# Patient Record
Sex: Female | Born: 1940 | Race: White | Hispanic: No | Smoking: Never smoker
Health system: Southern US, Community
[De-identification: ages and names within clinical notes are randomized; demographics above are authoritative.]

## PROBLEM LIST (undated history)

## (undated) DIAGNOSIS — E785 Hyperlipidemia, unspecified: Secondary | ICD-10-CM

## (undated) DIAGNOSIS — K648 Other hemorrhoids: Secondary | ICD-10-CM

## (undated) DIAGNOSIS — K449 Diaphragmatic hernia without obstruction or gangrene: Secondary | ICD-10-CM

## (undated) DIAGNOSIS — K222 Esophageal obstruction: Secondary | ICD-10-CM

## (undated) DIAGNOSIS — M8580 Other specified disorders of bone density and structure, unspecified site: Secondary | ICD-10-CM

## (undated) DIAGNOSIS — I2510 Atherosclerotic heart disease of native coronary artery without angina pectoris: Secondary | ICD-10-CM

## (undated) DIAGNOSIS — K644 Residual hemorrhoidal skin tags: Secondary | ICD-10-CM

## (undated) DIAGNOSIS — I2111 ST elevation (STEMI) myocardial infarction involving right coronary artery: Secondary | ICD-10-CM

## (undated) DIAGNOSIS — K219 Gastro-esophageal reflux disease without esophagitis: Secondary | ICD-10-CM

## (undated) DIAGNOSIS — D369 Benign neoplasm, unspecified site: Secondary | ICD-10-CM

## (undated) DIAGNOSIS — I10 Essential (primary) hypertension: Secondary | ICD-10-CM

## (undated) DIAGNOSIS — D249 Benign neoplasm of unspecified breast: Secondary | ICD-10-CM

## (undated) DIAGNOSIS — C4490 Unspecified malignant neoplasm of skin, unspecified: Secondary | ICD-10-CM

## (undated) HISTORY — DX: Other specified disorders of bone density and structure, unspecified site: M85.80

## (undated) HISTORY — PX: TONSILLECTOMY AND ADENOIDECTOMY: SUR1326

## (undated) HISTORY — PX: ESOPHAGOGASTRODUODENOSCOPY (EGD) WITH ESOPHAGEAL DILATION: SHX5812

## (undated) HISTORY — DX: Gastro-esophageal reflux disease without esophagitis: K21.9

## (undated) HISTORY — DX: Diaphragmatic hernia without obstruction or gangrene: K44.9

## (undated) HISTORY — DX: Benign neoplasm, unspecified site: D36.9

## (undated) HISTORY — DX: Other hemorrhoids: K64.8

## (undated) HISTORY — DX: Esophageal obstruction: K22.2

## (undated) HISTORY — DX: Benign neoplasm of unspecified breast: D24.9

## (undated) HISTORY — DX: Hyperlipidemia, unspecified: E78.5

## (undated) HISTORY — PX: BREAST CYST ASPIRATION: SHX578

## (undated) HISTORY — DX: Residual hemorrhoidal skin tags: K64.4

---

## 1969-05-21 HISTORY — PX: THYROID CYST EXCISION: SHX2511

## 1975-09-21 HISTORY — PX: ABDOMINAL HYSTERECTOMY: SHX81

## 1997-07-21 DIAGNOSIS — D369 Benign neoplasm, unspecified site: Secondary | ICD-10-CM

## 1997-07-21 HISTORY — DX: Benign neoplasm, unspecified site: D36.9

## 1997-07-29 ENCOUNTER — Encounter: Payer: Self-pay | Admitting: Gastroenterology

## 1998-02-06 ENCOUNTER — Ambulatory Visit (HOSPITAL_COMMUNITY): Admission: RE | Admit: 1998-02-06 | Discharge: 1998-02-06 | Payer: Self-pay | Admitting: Obstetrics and Gynecology

## 2001-11-17 ENCOUNTER — Other Ambulatory Visit: Admission: RE | Admit: 2001-11-17 | Discharge: 2001-11-17 | Payer: Self-pay | Admitting: Obstetrics and Gynecology

## 2003-05-14 ENCOUNTER — Encounter: Payer: Self-pay | Admitting: Gastroenterology

## 2004-11-26 ENCOUNTER — Other Ambulatory Visit: Admission: RE | Admit: 2004-11-26 | Discharge: 2004-11-26 | Payer: Self-pay | Admitting: Addiction Medicine

## 2004-12-07 ENCOUNTER — Ambulatory Visit: Payer: Self-pay | Admitting: Family Medicine

## 2007-07-24 ENCOUNTER — Ambulatory Visit: Payer: Self-pay | Admitting: Gastroenterology

## 2007-07-26 ENCOUNTER — Ambulatory Visit: Payer: Self-pay | Admitting: Gastroenterology

## 2007-07-26 ENCOUNTER — Encounter: Payer: Self-pay | Admitting: Gastroenterology

## 2007-09-25 ENCOUNTER — Ambulatory Visit: Payer: Self-pay | Admitting: Gastroenterology

## 2007-11-15 DIAGNOSIS — K219 Gastro-esophageal reflux disease without esophagitis: Secondary | ICD-10-CM | POA: Insufficient documentation

## 2007-11-15 DIAGNOSIS — Z860101 Personal history of adenomatous and serrated colon polyps: Secondary | ICD-10-CM | POA: Insufficient documentation

## 2007-11-15 DIAGNOSIS — K319 Disease of stomach and duodenum, unspecified: Secondary | ICD-10-CM | POA: Insufficient documentation

## 2007-11-15 DIAGNOSIS — K449 Diaphragmatic hernia without obstruction or gangrene: Secondary | ICD-10-CM | POA: Insufficient documentation

## 2007-11-15 DIAGNOSIS — K648 Other hemorrhoids: Secondary | ICD-10-CM | POA: Insufficient documentation

## 2007-11-15 DIAGNOSIS — Z86010 Personal history of colonic polyps: Secondary | ICD-10-CM

## 2007-11-15 DIAGNOSIS — E7800 Pure hypercholesterolemia, unspecified: Secondary | ICD-10-CM | POA: Insufficient documentation

## 2007-11-15 DIAGNOSIS — E079 Disorder of thyroid, unspecified: Secondary | ICD-10-CM | POA: Insufficient documentation

## 2007-11-15 DIAGNOSIS — K644 Residual hemorrhoidal skin tags: Secondary | ICD-10-CM | POA: Insufficient documentation

## 2008-06-18 ENCOUNTER — Ambulatory Visit: Payer: Self-pay | Admitting: Gastroenterology

## 2008-06-25 ENCOUNTER — Encounter: Payer: Self-pay | Admitting: Obstetrics and Gynecology

## 2008-06-25 ENCOUNTER — Other Ambulatory Visit: Admission: RE | Admit: 2008-06-25 | Discharge: 2008-06-25 | Payer: Self-pay | Admitting: Obstetrics and Gynecology

## 2008-06-25 ENCOUNTER — Ambulatory Visit: Payer: Self-pay | Admitting: Obstetrics and Gynecology

## 2008-07-02 ENCOUNTER — Ambulatory Visit: Payer: Self-pay | Admitting: Gastroenterology

## 2008-10-08 ENCOUNTER — Telehealth: Payer: Self-pay | Admitting: Gastroenterology

## 2009-09-11 ENCOUNTER — Telehealth: Payer: Self-pay | Admitting: Gastroenterology

## 2009-10-07 ENCOUNTER — Ambulatory Visit: Payer: Self-pay | Admitting: Gastroenterology

## 2010-01-05 ENCOUNTER — Ambulatory Visit: Payer: Self-pay | Admitting: Obstetrics and Gynecology

## 2010-10-22 NOTE — Procedures (Signed)
Summary: Colonoscopy w/ bx/[facility]  Colonoscopy w/ bx/[facility]   Imported By: Sherian Rein 10/08/2009 09:14:56  _____________________________________________________________________  External Attachment:    Type:   Image     Comment:   External Document

## 2010-10-22 NOTE — Assessment & Plan Note (Signed)
Summary: routine check up GERD/omeprazole refills..dn   History of Present Illness Visit Type: Follow-up Visit Primary GI MD: Elie Goody MD [facility] Chief Complaint: folow-up GERD needs refill of Omeprazole History of Present Illness:   Sarah Ray returns today with her husband. Her reflux symptoms are under excellent control on omeprazole. She has no dysphasia. Her last endoscopy revealed LA classification grade C. erosive esophagitis and a peptic stricture.   GI Review of Systems      Denies abdominal pain, acid reflux, belching, bloating, chest pain, dysphagia with liquids, dysphagia with solids, heartburn, loss of appetite, nausea, vomiting, vomiting blood, weight loss, and  weight gain.        Denies anal fissure, black tarry stools, change in bowel habit, constipation, diarrhea, diverticulosis, fecal incontinence, heme positive stool, hemorrhoids, irritable bowel syndrome, jaundice, light color stool, liver problems, rectal bleeding, and  rectal pain.   Current Medications (verified): 1)  Omeprazole 20 Mg Cpdr (Omeprazole) .... One Tablet By Mouth Once Daily. Please Keep Office Visit For Further Refills! 2)  Calcium 600+d Plus Minerals 600-400 Mg-Unit Tabs (Calcium Carbonate-Vit D-Min) .Marland Kitchen.. 1 Tablet By Mouth Two Times A Day 3)  Fish Oil 1000 Mg Caps (Omega-3 Fatty Acids) .Marland Kitchen.. 1 Capsule By Mouth Two Times A Day 4)  Cinnamon 500 Mg Caps (Cinnamon) .Marland Kitchen.. 1 Capsule By Mouth Two Times A Day 5)  Vitamin C 500 Mg Tabs (Ascorbic Acid) .Marland Kitchen.. 1 Tablet By Mouth Once Daily  Allergies (verified): 1)  ! Jonne Ply  Past History:  Past Medical History: HIATAL HERNIA (ICD-553.3) PEPTIC STRICTURE (ICD-537.89) GERD with LA Class Grade C Erosive esophagitis  HPERCHOLESTEROLEMIA (ICD-272.0) FIBROIDS, UTERUS (ICD-218.9) ADENOMATOUS COLONIC POLYP (ICD-211.3) INTERNAL HEMORRHOIDS (ICD-455.0) HEMORRHOIDS, EXTERNAL (ICD-455.3) UNSPECIFIED DISORDER OF THYROID (ICD-246.9)    Past Surgical  History: Reviewed history from 11/15/2007 and no changes required. hysterectomy 1977 tonsillectomy age 89 thyroidectomy 49  Family History: Reviewed history from 10/02/2009 and no changes required. Family History of Colon Cancer:Maternal Aunt Family History of Colon Polyps:Mother Family History of Breast Cancer:Daughter  Social History: Reviewed history from 10/02/2009 and no changes required. 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  Alcohol Use - no Illicit Drug Use - no  Review of Systems       The patient complains of allergy/sinus, cough, and sore throat.         The pertinent positives and negatives are noted as above and in the HPI. All other ROS were reviewed and were negative.   Vital Signs:  Patient profile:   69 year old female Height:      61.5 inches Weight:      151.38 pounds BMI:     28.24 Pulse rate:   84 / minute Pulse rhythm:   regular BP sitting:   106 / 70  (left arm)  Vitals Entered By: Milford Cage NCMA (October 07, 2009 3:03 PM)  Physical Exam  General:  Well developed, well nourished, no acute distress. Head:  Normocephalic and atraumatic. Eyes:  PERRLA, no icterus. Mouth:  No deformity or lesions, dentition normal. Lungs:  Clear throughout to auscultation. Heart:  Regular rate and rhythm; no murmurs, rubs,  or bruits. Abdomen:  Soft, nontender and nondistended. No masses, hepatosplenomegaly or hernias noted. Normal bowel sounds. Psych:  Alert and cooperative. Normal mood and affect.  Impression & Recommendations:  Problem # 1:  GERD (ICD-530.81) GERD with a history of erosive esophagitis and a peptic stricture. Continue antireflux measures long-term. Omeprazole 20 mg q.a.m. long term.  Problem # 2:  PERSONAL HX COLONIC POLYPS (ICD-V12.72) Personal history of adenomatous colon polyps. Surveillance colonoscopy recommended August 2014.  Patient Instructions: 1)  Avoid foods high in acid content ( tomatoes, citrus  juices, spicy foods) . Avoid eating within 3 to 4 hours of lying down or before exercising. Do not over eat; try smaller more frequent meals. Elevate head of bed four inches when sleeping.  2)  Please schedule a follow-up appointment in 1 year.  Appended Document: routine check up GERD/omeprazole refills..dn    Clinical Lists Changes  Medications: Changed medication from OMEPRAZOLE 20 MG CPDR (OMEPRAZOLE) one tablet by mouth once daily. PLEASE KEEP OFFICE VISIT FOR FURTHER REFILLS! to OMEPRAZOLE 20 MG CPDR (OMEPRAZOLE) one tablet by mouth once daily. - Signed Rx of OMEPRAZOLE 20 MG CPDR (OMEPRAZOLE) one tablet by mouth once daily.;  #90 x 3;  Signed;  Entered by: Christie Nottingham CMA (AAMA);  Authorized by: Meryl Dare MD [facility];  Method used: Electronically to [facility] , ,   , Ph: [phone], Fax: [phone]    Prescriptions: OMEPRAZOLE 20 MG CPDR (OMEPRAZOLE) one tablet by mouth once daily.  #90 x 3   Entered by:   Christie Nottingham CMA (AAMA)   Authorized by:   Meryl Dare MD [facility]   Signed by:   Christie Nottingham CMA (AAMA) on 10/07/2009   Method used:   Electronically to        [facility]* (mail-order)             ,          Ph: [phone]       Fax: [phone]   RxID:   [id number]

## 2010-12-02 ENCOUNTER — Encounter: Payer: Self-pay | Admitting: Gastroenterology

## 2010-12-09 ENCOUNTER — Encounter: Payer: Self-pay | Admitting: Gastroenterology

## 2010-12-09 ENCOUNTER — Ambulatory Visit (INDEPENDENT_AMBULATORY_CARE_PROVIDER_SITE_OTHER): Payer: BLUE CROSS/BLUE SHIELD | Admitting: Gastroenterology

## 2010-12-09 DIAGNOSIS — K219 Gastro-esophageal reflux disease without esophagitis: Secondary | ICD-10-CM

## 2010-12-09 DIAGNOSIS — Z860100 Personal history of colon polyps, unspecified: Secondary | ICD-10-CM

## 2010-12-09 MED ORDER — OMEPRAZOLE 20 MG PO CPDR: 20.0000 mg | DELAYED_RELEASE_CAPSULE | Freq: Every day | ORAL | Status: DC

## 2010-12-09 NOTE — Progress Notes (Signed)
History of Present Illness: This is a  70 year old female with a history of GERD complicated by peptic stricture. She has had excellent control of her reflux symptoms with very rare episodes of breakthrough over the past several months. She denies dysphagia odynophagia weight loss melena hematochezia change in bowel habits or abdominal pain.    Review of Systems: Pertinent positive and negative review of systems were noted in the above HPI section. All other review of systems were otherwise negative.    Current Medications, Allergies, Past Medical History, Past Surgical History, Family History and Social History were reviewed in [employer] record.   Physical Exam: General: Well developed , well nourished, no acute distress Head: Normocephalic and atraumatic Eyes:  sclerae anicteric, EOMI Ears: Normal auditory acuity Mouth: No deformity or lesions Lungs: Clear throughout to auscultation Heart: Regular rate and rhythm; no murmurs, rubs or bruits Abdomen: Soft, non tender and non distended. No masses, hepatosplenomegaly or hernias noted. Normal Bowel sounds Musculoskeletal: Symmetrical with no gross deformities  Pulses:  Normal pulses noted Extremities: No clubbing, cyanosis, edema or deformities noted Neurological: Alert oriented x 4, grossly nonfocal Psychological:  Alert and cooperative. Normal mood and affect

## 2010-12-09 NOTE — Patient Instructions (Signed)
Your prescription has been sent to [facility] order pharmacy.

## 2010-12-09 NOTE — Assessment & Plan Note (Signed)
Prior history of adenomatous colon polyps. Surveillance colonoscopy due October 2014.

## 2010-12-09 NOTE — Assessment & Plan Note (Signed)
GERD well controlled on omeprazole and antireflux measures. No dysphasia or other warning symptoms. Continue omeprazole 20 mg daily and standard antireflux measures.

## 2011-02-02 NOTE — Assessment & Plan Note (Signed)
[facility]                         GASTROENTEROLOGY OFFICE NOTE   NAME:Sarah Ray, Sarah Ray                          MRN:          [id number]  DATE:09/25/2007                            DOB:          05/26/1941    This is a return office visit for GERD, with a peptic stricture.  She  has had complete resolution of her dysphagia and reflux symptoms  following dilation in early November 2008.  She remains on omeprazole 20  mg p.o. daily and is completely asymptomatic.   CURRENT MEDICATIONS:  Listed on the chart, updated and reviewed.   MEDICATION ALLERGIES:  ASPIRIN, leading to hives.   PHYSICAL EXAMINATION:  GENERAL:  No acute distress.  VITAL SIGNS:  Weight 156.6 pounds, blood pressure is 148/88, pulse 88  and regular.   She was not reexamined.   ASSESSMENT AND PLAN:  Gastroesophageal reflux disease, with grade C  erosive esophagitis and a peptic stricture.  Maintain all antireflux  measures, including the use of 4 inch bed  blocks long term.  Maintain  omeprazole 20 mg p.o. q.a.m. long term.  Return office visit in one year  and as needed.     Venita Lick. Russella Dar, MD, [facility]  Electronically Signed    MTS/MedQ  DD: 09/25/2007  DT: 09/25/2007  Job #: [id number]   cc:   Dina Rich

## 2011-02-02 NOTE — Assessment & Plan Note (Signed)
[facility]                         GASTROENTEROLOGY OFFICE NOTE   NAME:Gravatt, Kirrah R                          MRN:          [id number]  DATE:07/24/2007                            DOB:          12-26-1940    OFFICE CONSULTATION:   REASON FOR CONSULTATION:  Dysphagia.   HISTORY OF PRESENT ILLNESS:  Ms. Dockery is a 70 year old white female  that I have evaluated in the past.  She has a personal history of  adenomatous colon polyps.  That last colonoscopy was performed in August  2004 that showed internal and external hemorrhoids.  She has had  intermittent mild reflux symptoms that occur every few weeks.  Over the  past several months she has noted intermittent problems swallowing bread  and meat.  About one month ago she had an episode where she had  significant and prolonged dysphagia when swallowing a grape.  After  about 2-3 hours the grape finally passed.  She did spit up some blood-  tinged saliva during the time the grape was lodged.  She has no  odynophagia, abdominal pain, chest pain, change in bowel habits, melena,  hematochezia or change in stool caliber.  She has a maternal aunt with  colon cancer and her mother has colon polyps.   PAST MEDICAL HISTORY:  1. Adenomatous colon polyps.  2. Status post thyroid surgery in 1965.  3. Internal and external hemorrhoids.  4. Hyperlipidemia.  5. Status post hysterectomy in 1977.  6. Status post T&A as a child.   CURRENT MEDICATIONS:  1. Lyrica 75 mg q.h.s.  2. Calcium 600 mg plus D one b.i.d.  3. Vitamin C 500 mg daily.   MEDICATION ALLERGIES:  ASPIRIN, leading to hives.   SOCIAL HISTORY:  Per handwritten form.   REVIEW OF SYSTEMS:  Per handwritten form.   PHYSICAL EXAM:  Well-developed, well-nourished white female in no acute  distress.  Height 5 feet 1-1/2 inches, weight 153 pounds.  Blood pressure is  132/70, pulse 100 and regular.  HEENT:  Anicteric sclerae.  Oropharynx clear.  CHEST:  Clear to auscultation bilaterally.  CARDIAC:  Regular rate and rhythm without murmurs appreciated.  ABDOMEN:  Soft, nontender, nondistended.  Normoactive bowel sounds.  No  palpable organomegaly, masses or hernias.  EXTREMITIES:  Without clubbing, cyanosis, or edema.  NEUROLOGIC:  Alert and oriented x3.  Grossly nonfocal.   ASSESSMENT AND PLAN:  1. Solid food dysphagia and reflux symptoms.  Rule out peptic      strictures, esophagitis, and, less likely, upper gastrointestinal      tract neoplasms.  Begin standard antireflux measures and Prilosec      OTC one p.o. q.a.m.  Risks, benefits, and alternatives to upper      endoscopy with possible biopsy and possible dilation discussed with      the patient and she consents to proceed.  This will be scheduled      electively.  2. Personal history of adenomatous colon polyps and a family history      of colon cancer.  Recall colonoscopy recommended for August 2009.  Venita Lick. Russella Dar, MD, [facility]  Electronically Signed    MTS/MedQ  DD: 07/24/2007  DT: 07/25/2007  Job #: [id number]   cc:   Dina Rich

## 2011-10-27 ENCOUNTER — Other Ambulatory Visit: Payer: Self-pay

## 2011-10-27 MED ORDER — OMEPRAZOLE 20 MG PO CPDR: 20.0000 mg | DELAYED_RELEASE_CAPSULE | Freq: Every day | ORAL | Status: DC

## 2011-11-09 ENCOUNTER — Other Ambulatory Visit: Payer: Self-pay | Admitting: Gastroenterology

## 2011-11-09 MED ORDER — OMEPRAZOLE 20 MG PO CPDR: 20.0000 mg | DELAYED_RELEASE_CAPSULE | Freq: Every day | ORAL | Status: DC

## 2011-11-09 NOTE — Telephone Encounter (Signed)
Sent one refill to her pharmacy until her appt scheduled for 11/24/11.

## 2011-11-22 ENCOUNTER — Ambulatory Visit: Payer: BLUE CROSS/BLUE SHIELD | Admitting: Gastroenterology

## 2011-12-06 ENCOUNTER — Ambulatory Visit: Payer: BLUE CROSS/BLUE SHIELD | Admitting: Gastroenterology

## 2011-12-15 ENCOUNTER — Encounter: Payer: Self-pay | Admitting: Gastroenterology

## 2011-12-15 ENCOUNTER — Ambulatory Visit (INDEPENDENT_AMBULATORY_CARE_PROVIDER_SITE_OTHER): Payer: Self-pay | Admitting: Gastroenterology

## 2011-12-15 VITALS — BP 128/76 | HR 80 | Ht 61.0 in | Wt 145.0 lb

## 2011-12-15 DIAGNOSIS — Z860100 Personal history of colon polyps, unspecified: Secondary | ICD-10-CM

## 2011-12-15 DIAGNOSIS — K219 Gastro-esophageal reflux disease without esophagitis: Secondary | ICD-10-CM

## 2011-12-15 MED ORDER — OMEPRAZOLE 20 MG PO CPDR: 20.0000 mg | DELAYED_RELEASE_CAPSULE | Freq: Every day | ORAL | Status: DC

## 2011-12-15 NOTE — Patient Instructions (Signed)
We have sent the following medications to your mail order pharmacy: Omeprazole. You will be due for a recall colonoscopy in 06/2013. We will send you a reminder in the mail when it gets closer to that time. cc: Feliciana Rossetti, MD

## 2011-12-15 NOTE — Progress Notes (Signed)
History of Present Illness: This is a 71 year old female returning for followup of GERD. She has a history of erosive esophagitis and a peptic stricture. She has no GI symptom on her current regimen. Denies weight loss, abdominal pain, constipation, diarrhea, change in stool caliber, melena, hematochezia, nausea, vomiting, dysphagia, reflux symptoms, chest pain.  Current Medications, Allergies, Past Medical History, Past Surgical History, Family History and Social History were reviewed in [employer] record.  Physical Exam: General: Well developed , well nourished, no acute distress Head: Normocephalic and atraumatic Eyes:  sclerae anicteric, EOMI Ears: Normal auditory acuity Mouth: No deformity or lesions Lungs: Clear throughout to auscultation Heart: Regular rate and rhythm; no murmurs, rubs or bruits Abdomen: Soft, non tender and non distended. No masses, hepatosplenomegaly or hernias noted. Normal Bowel sounds Musculoskeletal: Symmetrical with no gross deformities  Pulses:  Normal pulses noted Extremities: No clubbing, cyanosis, edema or deformities noted Neurological: Alert oriented x 4, grossly nonfocal Psychological:  Alert and cooperative. Normal mood and affect  Assessment and Recommendations:  1. GERD with a history of erosive esophagitis and a peptic stricture. Continue standard antireflux measures and omeprazole 20 mg daily.  2. Personal history of adenomatous colon polyps. Surveillance colonoscopy October 2014.

## 2012-06-12 ENCOUNTER — Telehealth: Payer: Self-pay | Admitting: *Deleted

## 2012-06-12 NOTE — Telephone Encounter (Signed)
(  pt aware you are out of the office) Pt has annual scheduled on 06/30/12, pt said she spoke with you about a cream she could use to help with vaginal dryness during pap? Please advise

## 2012-06-12 NOTE — Telephone Encounter (Signed)
Estrace cream 1 gram in vagina hs nightly till appointment.

## 2012-06-13 MED ORDER — ESTRADIOL 0.1 MG/GM VA CREA: TOPICAL_CREAM | VAGINAL | Status: DC

## 2012-06-13 NOTE — Telephone Encounter (Signed)
Pt informed, rx sent 

## 2012-06-13 NOTE — Addendum Note (Signed)
Addended by: Aura Camps on: 06/13/2012 09:06 AM   Modules accepted: Orders

## 2012-06-19 ENCOUNTER — Encounter: Payer: Self-pay | Admitting: Obstetrics and Gynecology

## 2012-06-20 ENCOUNTER — Encounter: Payer: Self-pay | Admitting: Gynecology

## 2012-06-20 DIAGNOSIS — M8580 Other specified disorders of bone density and structure, unspecified site: Secondary | ICD-10-CM | POA: Insufficient documentation

## 2012-06-20 DIAGNOSIS — D249 Benign neoplasm of unspecified breast: Secondary | ICD-10-CM | POA: Insufficient documentation

## 2012-06-30 ENCOUNTER — Encounter: Payer: Self-pay | Admitting: Obstetrics and Gynecology

## 2012-06-30 ENCOUNTER — Ambulatory Visit (INDEPENDENT_AMBULATORY_CARE_PROVIDER_SITE_OTHER): Payer: Medicare Other | Admitting: Obstetrics and Gynecology

## 2012-06-30 VITALS — BP 150/96 | Ht 62.0 in | Wt 146.0 lb

## 2012-06-30 DIAGNOSIS — M899 Disorder of bone, unspecified: Secondary | ICD-10-CM

## 2012-06-30 DIAGNOSIS — M8580 Other specified disorders of bone density and structure, unspecified site: Secondary | ICD-10-CM

## 2012-06-30 DIAGNOSIS — D249 Benign neoplasm of unspecified breast: Secondary | ICD-10-CM

## 2012-06-30 DIAGNOSIS — N9089 Other specified noninflammatory disorders of vulva and perineum: Secondary | ICD-10-CM

## 2012-06-30 DIAGNOSIS — N952 Postmenopausal atrophic vaginitis: Secondary | ICD-10-CM

## 2012-06-30 DIAGNOSIS — D241 Benign neoplasm of right breast: Secondary | ICD-10-CM

## 2012-06-30 MED ORDER — NYSTATIN-TRIAMCINOLONE 100000-0.1 UNIT/GM-% EX OINT: TOPICAL_OINTMENT | Freq: Two times a day (BID) | CUTANEOUS | Status: DC

## 2012-06-30 NOTE — Progress Notes (Addendum)
Patient came back to see me today for further followup. We have been watching her with osteopenia. Her fracture risk is slightly elevated but not at the treatment level. She has had no fractures. She takes calcium and vitamin D. She thinks she had a bone density either in 2011 or  2012 but the last one we received was 2009. She has atrophic vaginitis and has used  estrogen cream but is not currently using it and is doing well. She had a total abdominal hysterectomy in 1977 for fibroids. She is always had normal Pap smears. Her last Pap smear was  2011. She uses nystatin/triamcinolone cream for vulvitis. She is having no vaginal bleeding. She is having no pelvic pain. She had a biopsy of her right breast previously for fibroadenoma. Her mammogram this year was  Stable.  ROS: 12 system review done. Pertinent positives above.other positives include hyperlipidemia, hypothyroidism, colonic polyps, hemorrhoids and Hiatal hernia.  HEENT: Within normal limits.Kennon Portela present. Neck: No masses. Supraclavicular lymph nodes: Not enlarged. Breasts: Examined in both sitting and lying position. Symmetrical without skin changes or masses. Abdomen: Soft no masses guarding or rebound. No hernias. Pelvic: External within normal limits. BUS within normal limits. Vaginal examination shows poor estrogen effect, no cystocele enterocele or rectocele. Cervix and uterus absent. Adnexa within normal limits. Rectovaginal confirmatory. Extremities within normal limits.  Assessment: #1. Osteopenia #2. Fibroadenoma of right breast #3. Atrophic vaginitis #4. Vulvitis  Plan: Patient came he her bone density and we will tell her when to get the next 1. Estrace cream when necessary. Continue yearly mammograms. Nystatin/triamcinolone cream when necessary. Pap not done.The new Pap smear guidelines were discussed with the patient.  Patient's blood pressure was elevated today. She says that's anomaly. She will check it several times  at home and let me know.

## 2012-06-30 NOTE — Patient Instructions (Signed)
Have them send me your  last bone density.

## 2012-07-04 ENCOUNTER — Encounter: Payer: Self-pay | Admitting: Obstetrics and Gynecology

## 2012-07-28 ENCOUNTER — Other Ambulatory Visit: Payer: Self-pay | Admitting: Dermatology

## 2012-09-20 DIAGNOSIS — C4490 Unspecified malignant neoplasm of skin, unspecified: Secondary | ICD-10-CM

## 2012-09-20 HISTORY — PX: SKIN CANCER EXCISION: SHX779

## 2012-09-20 HISTORY — DX: Unspecified malignant neoplasm of skin, unspecified: C44.90

## 2012-12-11 ENCOUNTER — Ambulatory Visit (INDEPENDENT_AMBULATORY_CARE_PROVIDER_SITE_OTHER): Payer: Medicare Other | Admitting: Gastroenterology

## 2012-12-11 ENCOUNTER — Encounter: Payer: Self-pay | Admitting: Gastroenterology

## 2012-12-11 VITALS — BP 120/76 | HR 80 | Ht 61.0 in | Wt 146.8 lb

## 2012-12-11 DIAGNOSIS — K219 Gastro-esophageal reflux disease without esophagitis: Secondary | ICD-10-CM

## 2012-12-11 DIAGNOSIS — Z86010 Personal history of colonic polyps: Secondary | ICD-10-CM

## 2012-12-11 MED ORDER — OMEPRAZOLE 20 MG PO CPDR: 20.0000 mg | DELAYED_RELEASE_CAPSULE | Freq: Every day | ORAL | Status: DC

## 2012-12-11 NOTE — Patient Instructions (Addendum)
We have given you samples of the following medication to take: Omeprazole 20 mg  We have sent the following prescriptions to your mail in pharmacy: Omeprazole  If you have not heard from your mail in pharmacy within 1 week or if you have not received your medication in the mail, please contact us at [phone] so we may find out why.  You will be due for a recall colonoscopy in 06/2013. We will send you a reminder in the mail when it gets closer to that time.  Thank you for choosing Dr Russella Dar and [facility]!!  CC: Dr Shary Decamp

## 2012-12-11 NOTE — Progress Notes (Signed)
History of Present Illness: This is a 72 year old female with a history of GERD with erosive esophagitis and a peptic stricture. Reflux symptoms are completely controlled on antireflux measures and daily omeprazole. She has no gastrointestinal complaints. Denies weight loss, abdominal pain, constipation, diarrhea, change in stool caliber, melena, hematochezia, nausea, vomiting, dysphagia, reflux symptoms, chest pain.  Current Medications, Allergies, Past Medical History, Past Surgical History, Family History and Social History were reviewed in [employer] record.  Physical Exam: General: Well developed , well nourished, no acute distress Head: Normocephalic and atraumatic Eyes:  sclerae anicteric, EOMI Ears: Normal auditory acuity Mouth: No deformity or lesions Lungs: Clear throughout to auscultation Heart: Regular rate and rhythm; no murmurs, rubs or bruits Abdomen: Soft, non tender and non distended. No masses, hepatosplenomegaly or hernias noted. Normal Bowel sounds Rectal: Deferred to colonoscopy later this year. Musculoskeletal: Symmetrical with no gross deformities  Pulses:  Normal pulses noted Extremities: No clubbing, cyanosis, edema or deformities noted Neurological: Alert oriented x 4, grossly nonfocal Psychological:  Alert and cooperative. Normal mood and affect  Assessment and Recommendations:  1. GERD with a history of erosive esophagitis and a peptic stricture. Continue standard antireflux measures and omeprazole 20 mg daily.  2. Personal history of adenomatous colon polyps. Surveillance colonoscopy recommended October 2014.

## 2013-03-13 ENCOUNTER — Other Ambulatory Visit: Payer: Self-pay | Admitting: Dermatology

## 2013-04-04 HISTORY — PX: BUNIONECTOMY: SHX129

## 2013-04-25 ENCOUNTER — Other Ambulatory Visit: Payer: Self-pay

## 2013-05-03 ENCOUNTER — Encounter: Payer: Self-pay | Admitting: Gastroenterology

## 2013-07-10 ENCOUNTER — Ambulatory Visit (INDEPENDENT_AMBULATORY_CARE_PROVIDER_SITE_OTHER): Payer: Medicare Other | Admitting: Podiatrist

## 2013-07-10 ENCOUNTER — Encounter: Payer: Self-pay | Admitting: Podiatrist

## 2013-07-10 ENCOUNTER — Ambulatory Visit: Payer: Medicare Other

## 2013-07-10 ENCOUNTER — Ambulatory Visit (INDEPENDENT_AMBULATORY_CARE_PROVIDER_SITE_OTHER): Payer: Medicare Other

## 2013-07-10 VITALS — BP 141/92 | HR 77 | Resp 16

## 2013-07-10 DIAGNOSIS — M79671 Pain in right foot: Secondary | ICD-10-CM

## 2013-07-10 DIAGNOSIS — Z09 Encounter for follow-up examination after completed treatment for conditions other than malignant neoplasm: Secondary | ICD-10-CM

## 2013-07-10 DIAGNOSIS — M79609 Pain in unspecified limb: Secondary | ICD-10-CM

## 2013-07-10 DIAGNOSIS — M79672 Pain in left foot: Secondary | ICD-10-CM

## 2013-07-10 NOTE — Progress Notes (Signed)
  Subjective:    Patient ID: Sarah Ray, female    DOB: 30-Aug-1941, 72 y.o.   MRN: [id number]  HPI S/P bunion correction on bilateral feet.  DOS  04/04/2013.  Patient states "they are great".  Relates some numbness in toes and some swelling as well.  States she can bend the toes up and down well and she has been wearing comfortable allegria shoes or similar without any problem.      Review of Systems  Constitutional: Negative.   HENT: Negative.   Eyes: Negative.   Respiratory: Negative.   Cardiovascular: Negative.   Gastrointestinal: Negative.        Acid reflux  Endocrine: Negative.   Musculoskeletal: Negative.   Skin: Negative.   Allergic/Immunologic: Negative.   Neurological: Negative.   Hematological: Negative.   Psychiatric/Behavioral: Negative.        Objective:   Physical Exam Neurovascular status is intact and unchanged. Excellent clinical appearance of first metatarsophalangeal joint noted bilaterally. Range of motion is improving left greater than right. Incision site is completely healed with slight swelling of the right first metatarsophalangeal joint compared to the left. Excellent appearance overall is noted. X-rays taken today reveal fully healed osteotomy of the right first metatarsophalangeal joint with screw fixation noted.  Good alignment and position of the first metatarsophalangeal joint with left status post McBride bunionectomy noted.       Assessment & Plan: Assessment is status post bunion correction bilateral    Assessment: Status post bunion correction Austin bunion correction right McBride bunion correction left Plan:  Recommended continued ROM exercises.  Patient may return to normal activities as tolerated.  She will follow up prn.

## 2013-07-10 NOTE — Patient Instructions (Signed)
Continue with your stretching exercises on your right great toe joint especially.  You do not require any further follow-up.  Your bones are fully healed and you can resume all activities as tolerated!  Call if any problems or concerns may arise.

## 2013-07-25 ENCOUNTER — Encounter: Payer: Self-pay | Admitting: Gastroenterology

## 2013-07-26 ENCOUNTER — Other Ambulatory Visit: Payer: Self-pay

## 2013-09-20 DIAGNOSIS — I219 Acute myocardial infarction, unspecified: Secondary | ICD-10-CM

## 2013-09-20 HISTORY — DX: Acute myocardial infarction, unspecified: I21.9

## 2013-09-25 ENCOUNTER — Telehealth: Payer: Self-pay

## 2013-09-25 ENCOUNTER — Telehealth: Payer: Self-pay | Admitting: Gastroenterology

## 2013-09-25 ENCOUNTER — Ambulatory Visit (AMBULATORY_SURGERY_CENTER): Payer: Self-pay

## 2013-09-25 VITALS — Ht 61.0 in | Wt 150.0 lb

## 2013-09-25 DIAGNOSIS — Z860100 Personal history of colon polyps, unspecified: Secondary | ICD-10-CM

## 2013-09-25 DIAGNOSIS — K219 Gastro-esophageal reflux disease without esophagitis: Secondary | ICD-10-CM

## 2013-09-25 MED ORDER — OMEPRAZOLE 20 MG PO CPDR: 20.0000 mg | DELAYED_RELEASE_CAPSULE | Freq: Every day | ORAL | Status: DC

## 2013-09-25 MED ORDER — MOVIPREP 100 G PO SOLR: 1.0000 | Freq: Once | ORAL | Status: DC

## 2013-09-25 NOTE — Telephone Encounter (Signed)
During PV pt requested Omeprazole refills.  She is on it long-term.  Reordered for pt via mail pharmacy.

## 2013-09-25 NOTE — Telephone Encounter (Signed)
Gave patient 2 samples of Prilosec. Told patient that is all we have as of right now. Pt verbalized understanding.

## 2013-09-28 ENCOUNTER — Encounter: Payer: Self-pay | Admitting: Gastroenterology

## 2013-10-09 ENCOUNTER — Encounter: Payer: Self-pay | Admitting: Gastroenterology

## 2013-10-09 ENCOUNTER — Ambulatory Visit (AMBULATORY_SURGERY_CENTER): Payer: Medicare Other | Admitting: Gastroenterology

## 2013-10-09 VITALS — BP 130/70 | HR 61 | Temp 97.1°F | Resp 20 | Ht 61.0 in | Wt 150.0 lb

## 2013-10-09 DIAGNOSIS — Z86010 Personal history of colonic polyps: Secondary | ICD-10-CM

## 2013-10-09 MED ORDER — SODIUM CHLORIDE 0.9 % IV SOLN: 500.0000 mL | INTRAVENOUS | Status: DC

## 2013-10-09 NOTE — Progress Notes (Signed)
Report to pacu rn, vss, bbs=clear 

## 2013-10-09 NOTE — Op Note (Signed)
[facility]  [address]. [place], [address]   COLONOSCOPY PROCEDURE REPORT  PATIENT: Sarah Ray, Sarah Ray  MR#: [id number] BIRTHDATE: 1941/09/10 , 72  yrs. old GENDER: Female ENDOSCOPIST: Ladene Artist, MD, [facility] PROCEDURE DATE:  10/09/2013 PROCEDURE:   Colonoscopy, surveillance First Screening Colonoscopy - Avg.  risk and is 50 yrs.  old or older - No.  Prior Negative Screening - Now for repeat screening. N/A  History of Adenoma - Now for follow-up colonoscopy & has been > or = to 3 yrs.  Yes hx of adenoma.  Has been 3 or more years since last colonoscopy.  Polyps Removed Today? No.  Recommend repeat exam, <10 yrs? Yes.  High risk (family or personal hx). ASA CLASS:   Class II INDICATIONS:Patient's personal history of adenomatous colon polyps.  MEDICATIONS: MAC sedation, administered by CRNA and propofol (Diprivan) 260mg  IV DESCRIPTION OF PROCEDURE:   After the risks benefits and alternatives of the procedure were thoroughly explained, informed consent was obtained.  A digital rectal exam revealed no abnormalities of the rectum.   The [id number]  endoscope was introduced through the anus and advanced to the cecum, which was identified by both the appendix and ileocecal valve. No adverse events experienced.   The quality of the prep was excellent, using MoviPrep  The instrument was then slowly withdrawn as the colon was fully examined.  COLON FINDINGS: A normal appearing cecum, ileocecal valve, and appendiceal orifice were identified.  The ascending, hepatic flexure, transverse, splenic flexure, descending, sigmoid colon and rectum appeared unremarkable.  No polyps or cancers were seen. Retroflexed views revealed no abnormalities except patchy erythema likely prep related. The time to cecum=1 minutes 41 seconds. Withdrawal time=9 minutes 41 seconds.  The scope was withdrawn and the procedure completed.  COMPLICATIONS: There were no  complications.  ENDOSCOPIC IMPRESSION: 1.  Normal colon  RECOMMENDATIONS: 1.  Repeat Colonoscopy in 5 years.  eSigned:  Ladene Artist, MD, [facility] 10/09/2013 9:21 AM   cc: Gilford Rile, MD

## 2013-10-09 NOTE — Patient Instructions (Signed)
YOU HAD AN ENDOSCOPIC PROCEDURE TODAY AT THE [facility]: Refer to the procedure report that was given to you for any specific questions about what was found during the examination.  If the procedure report does not answer your questions, please call your gastroenterologist to clarify.  If you requested that your care partner not be given the details of your procedure findings, then the procedure report has been included in a sealed envelope for you to review at your convenience later.  YOU SHOULD EXPECT: Some feelings of bloating in the abdomen. Passage of more gas than usual.  Walking can help get rid of the air that was put into your GI tract during the procedure and reduce the bloating. If you had a lower endoscopy (such as a colonoscopy or flexible sigmoidoscopy) you may notice spotting of blood in your stool or on the toilet paper. If you underwent a bowel prep for your procedure, then you may not have a normal bowel movement for a few days.  DIET: Your first meal following the procedure should be a light meal and then it is ok to progress to your normal diet.  A half-sandwich or bowl of soup is an example of a good first meal.  Heavy or fried foods are harder to digest and may make you feel nauseous or bloated.  Likewise meals heavy in dairy and vegetables can cause extra gas to form and this can also increase the bloating.  Drink plenty of fluids but you should avoid alcoholic beverages for 24 hours.  ACTIVITY: Your care partner should take you home directly after the procedure.  You should plan to take it easy, moving slowly for the rest of the day.  You can resume normal activity the day after the procedure however you should NOT DRIVE or use heavy machinery for 24 hours (because of the sedation medicines used during the test).    SYMPTOMS TO REPORT IMMEDIATELY: A gastroenterologist can be reached at any hour.  During normal business hours, 8:30 AM to 5:00 PM Monday through Friday,  call [phone].  After hours and on weekends, please call the GI answering service at [phone] who will take a message and have the physician on call contact you.   Following lower endoscopy (colonoscopy or flexible sigmoidoscopy):  Excessive amounts of blood in the stool  Significant tenderness or worsening of abdominal pains  Swelling of the abdomen that is new, acute  Fever of 100F or higher  FOLLOW UP: If any biopsies were taken you will be contacted by phone or by letter within the next 1-3 weeks.  Call your gastroenterologist if you have not heard about the biopsies in 3 weeks.  Our staff will call the home number listed on your records the next business day following your procedure to check on you and address any questions or concerns that you may have at that time regarding the information given to you following your procedure. This is a courtesy call and so if there is no answer at the home number and we have not heard from you through the emergency physician on call, we will assume that you have returned to your regular daily activities without incident.  SIGNATURES/CONFIDENTIALITY: You and/or your care partner have signed paperwork which will be entered into your electronic medical record.  These signatures attest to the fact that that the information above on your After Visit Summary has been reviewed and is understood.  Full responsibility of the confidentiality of this   discharge information lies with you and/or your care-partner.  Resume medications. 

## 2013-10-10 ENCOUNTER — Telehealth: Payer: Self-pay

## 2013-10-10 NOTE — Telephone Encounter (Signed)
  Follow up Call-  Call back number 10/09/2013  Post procedure Call Back phone  # [phone]  Permission to leave phone message Yes     Patient questions:  Do you have a fever, pain , or abdominal swelling? no Pain Score  0 *  Have you tolerated food without any problems? yes  Have you been able to return to your normal activities? yes  Do you have any questions about your discharge instructions: Diet   no Medications  no Follow up visit  no  Do you have questions or concerns about your Care? no  Actions: * If pain score is 4 or above: No action needed, pain <4.

## 2013-12-03 ENCOUNTER — Encounter: Payer: Self-pay | Admitting: Gastroenterology

## 2013-12-03 ENCOUNTER — Ambulatory Visit (INDEPENDENT_AMBULATORY_CARE_PROVIDER_SITE_OTHER): Payer: Medicare Other | Admitting: Gastroenterology

## 2013-12-03 VITALS — BP 130/84 | HR 88 | Ht 60.5 in | Wt 149.4 lb

## 2013-12-03 DIAGNOSIS — K219 Gastro-esophageal reflux disease without esophagitis: Secondary | ICD-10-CM

## 2013-12-03 MED ORDER — OMEPRAZOLE 20 MG PO CPDR: 20.0000 mg | DELAYED_RELEASE_CAPSULE | Freq: Two times a day (BID) | ORAL | Status: DC

## 2013-12-03 NOTE — Progress Notes (Signed)
    History of Present Illness: This is a 73 year old female with a history of chronic GERD, an esophageal stricture and LA class C esophagitis. Last EGD done in November 2008. Her reflux symptoms have worsened recently. She notes frequent nighttime heartburn and regurgitation. She has recently been followed antireflux measures more closely and her symptoms have improved. Denies weight loss, abdominal pain, constipation, diarrhea, change in stool caliber, melena, hematochezia, nausea, vomiting, dysphagia, chest pain.  Current Medications, Allergies, Past Medical History, Past Surgical History, Family History and Social History were reviewed in [employer] record.  Physical Exam: General: Well developed , well nourished, no acute distress Head: Normocephalic and atraumatic Eyes:  sclerae anicteric, EOMI Ears: Normal auditory acuity Mouth: No deformity or lesions Lungs: Clear throughout to auscultation Heart: Regular rate and rhythm; no murmurs, rubs or bruits Abdomen: Soft, non tender and non distended. No masses, hepatosplenomegaly or hernias noted. Normal Bowel sounds Musculoskeletal: Symmetrical with no gross deformities  Pulses:  Normal pulses noted Extremities: No clubbing, cyanosis, edema or deformities noted Neurological: Alert oriented x 4, grossly nonfocal Psychological:  Alert and cooperative. Normal mood and affect  Assessment and Recommendations:  1. GERD with a history of a peptic stricture and erosive esophagitis. Intensify all antireflux measures. Increase omeprazole to 20 mg twice a day. If symptoms are not under very good control in 3-4 weeks she is advised to contact us for further recommendations.

## 2013-12-03 NOTE — Patient Instructions (Signed)
We have sent the following medications to your pharmacy for you to pick up at your convenience: Omeprazole.   Patient advised to avoid spicy, acidic, citrus, chocolate, mints, fruit and fruit juices.  Limit the intake of caffeine, alcohol and Soda.  Don't exercise too soon after eating.  Don't lie down within 3-4 hours of eating.  Elevate the head of your bed.  Thank you for choosing me and [facility].  Malcolm T. Stark, Jr., MD., FACG    

## 2014-01-18 DIAGNOSIS — M8580 Other specified disorders of bone density and structure, unspecified site: Secondary | ICD-10-CM

## 2014-01-18 HISTORY — DX: Other specified disorders of bone density and structure, unspecified site: M85.80

## 2014-02-04 ENCOUNTER — Encounter: Payer: Self-pay | Admitting: Gynecology

## 2014-02-12 ENCOUNTER — Encounter: Payer: Self-pay | Admitting: Gynecology

## 2014-02-12 ENCOUNTER — Telehealth: Payer: Self-pay | Admitting: Gynecology

## 2014-02-12 NOTE — Telephone Encounter (Signed)
Left message for pt to call.

## 2014-02-12 NOTE — Telephone Encounter (Signed)
Pt informed with the below note. 

## 2014-02-12 NOTE — Telephone Encounter (Signed)
Tell patient that her recent bone density shows statistically significant bone loss with increased risk of fracture. Recommend office visit to discuss treatment options.

## 2014-02-25 ENCOUNTER — Encounter: Payer: Self-pay | Admitting: Gynecology

## 2014-03-04 ENCOUNTER — Other Ambulatory Visit: Payer: Self-pay | Admitting: *Deleted

## 2014-03-04 DIAGNOSIS — M8580 Other specified disorders of bone density and structure, unspecified site: Secondary | ICD-10-CM

## 2014-03-04 NOTE — Addendum Note (Signed)
Addended by: Thamas Jaegers on: 03/04/2014 12:44 PM   Modules accepted: Orders

## 2014-03-14 ENCOUNTER — Encounter: Payer: Self-pay | Admitting: Gynecology

## 2014-03-14 ENCOUNTER — Ambulatory Visit (INDEPENDENT_AMBULATORY_CARE_PROVIDER_SITE_OTHER): Payer: Medicare Other | Admitting: Gynecology

## 2014-03-14 VITALS — BP 118/70 | Ht 60.5 in | Wt 138.0 lb

## 2014-03-14 DIAGNOSIS — M899 Disorder of bone, unspecified: Secondary | ICD-10-CM

## 2014-03-14 DIAGNOSIS — N760 Acute vaginitis: Secondary | ICD-10-CM

## 2014-03-14 DIAGNOSIS — M949 Disorder of cartilage, unspecified: Secondary | ICD-10-CM

## 2014-03-14 DIAGNOSIS — M8580 Other specified disorders of bone density and structure, unspecified site: Secondary | ICD-10-CM

## 2014-03-14 DIAGNOSIS — N8111 Cystocele, midline: Secondary | ICD-10-CM

## 2014-03-14 DIAGNOSIS — N952 Postmenopausal atrophic vaginitis: Secondary | ICD-10-CM

## 2014-03-14 DIAGNOSIS — N762 Acute vulvitis: Secondary | ICD-10-CM

## 2014-03-14 MED ORDER — NYSTATIN-TRIAMCINOLONE 100000-0.1 UNIT/GM-% EX OINT: TOPICAL_OINTMENT | Freq: Every day | CUTANEOUS | Status: DC

## 2014-03-14 NOTE — Progress Notes (Signed)
Sarah Ray 1940-12-02 [id number]        72 y.o.  [id number] for followup exam. Former patient of Dr. Cherylann Banas. Several issues noted below.  Past medical history,surgical history, problem list, medications, allergies, family history and social history were all reviewed and documented as reviewed in the EPIC chart.  ROS:  12 system ROS performed with pertinent positives and negatives included in the history, assessment and plan.    Additional significant findings :  None   Exam: Sarah Ray Vitals:   03/14/14 0912  BP: 118/70  Height: 5' 0.5" (1.537 m)  Weight: 138 lb (62.596 kg)   General appearance:  Normal affect, orientation and appearance. Skin: Grossly normal HEENT: Without gross lesions.  No cervical or supraclavicular adenopathy. Thyroid normal.  Lungs:  Clear without wheezing, rales or rhonchi Cardiac: RR, without RMG Abdominal:  Soft, nontender, without masses, guarding, rebound, organomegaly or hernia Breasts:  Examined lying and sitting without masses, retractions, discharge or axillary adenopathy. Pelvic:  Ext/BUS/vagina with generalized atrophic changes. Mild cystocele noted. Cuff well supported. No rectocele.  Adnexa  Without masses or tenderness    Anus and perineum  Normal   Rectovaginal  Normal sphincter tone without palpated masses or tenderness.    Assessment/Plan:  73 y.o. [id number] female for followup exam  1. Postmenopausal/atrophic genital changes. 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 Not having significant hot flushes, night sweats, vaginal dryness or dyspareunia. Continue to monitor. 2. Mild cystocele. Patient asymptomatic without significant symptoms. Does have some stress incontinence type symptoms that are not overly bothersome to the patient. Options for management reviewed and she is not interested in doing anything at this point. Followup if significant symptoms develop. 3. Intermittent vulvitis. Patient uses Mycolog cream intermittently for  irritation which results are issued. Currently not having any issues in her exam is normal. I refilled her Mycolog cream with 2 refills to use as needed. 4. Osteopenia. DEXA 01/2014 T score -2.0. FRAX 19.8%/4.8%. Statistically significant decrease in measured sites. Reviewed increase hip fracture FRAX and options for treatment to include bisphosphonates such as alendronate. Side effects/risk of medication reviewed to include GERD, osteonecrosis of the jaw, atypical fractures. Patient at this point is not interested in starting medication. She understands she is at an increased risk of fracture but would prefer to monitor. She has been off of her feet this past year due to bunion surgery and plans on increasing her weightbearing exercise now. She's going to further discuss with her primary Dr. Bea Graff who she has an appointment to see. I also asked her to have a vitamin D level when he does routine blood work and she agrees to ask him to do this. She'll call me if she changes her mind and wants to start alendronate. 5. Pap smear 2011. No Pap smear done today. No history of abnormal Pap smears. Status post hysterectomy for benign indications. Over the age of 74. We both agree on stop screening per current screening guidelines and she's comfortable with this. 6. Mammography 02/2014. Continue with annual mammography. SBE monthly reviewed. 7. Colonoscopy 2015. Repeat at their recommended interval. 8. Of maintenance. No routine blood work done as it is done through her primary physician's office. Followup one year, sooner as needed.  Note: This document was prepared with digital dictation and possible smart phrase technology. Any transcriptional errors that result from this process are unintentional.   Anastasio Auerbach MD, 10:11 AM 03/14/2014

## 2014-03-14 NOTE — Patient Instructions (Signed)
Remember to have your primary physician check your vitamin D level with your routine blood work. Call me if you change your mind about starting on Fosamax.  You may obtain a copy of any labs that were done today by logging onto MyChart as outlined in the instructions provided with your AVS (after visit summary). The office will not call with normal lab results but certainly if there are any significant abnormalities then we will contact you.   Health Maintenance, Female A healthy lifestyle and preventative care can promote health and wellness.  Maintain regular health, dental, and eye exams.  Eat a healthy diet. Foods like vegetables, fruits, whole grains, low-fat dairy products, and lean protein foods contain the nutrients you need without too many calories. Decrease your intake of foods high in solid fats, added sugars, and salt. Get information about a proper diet from your caregiver, if necessary.  Regular physical exercise is one of the most important things you can do for your health. Most adults should get at least 150 minutes of moderate-intensity exercise (any activity that increases your heart rate and causes you to sweat) each week. In addition, most adults need muscle-strengthening exercises on 2 or more days a week.   Maintain a healthy weight. The body mass index (BMI) is a screening tool to identify possible weight problems. It provides an estimate of body fat based on height and weight. Your caregiver can help determine your BMI, and can help you achieve or maintain a healthy weight. For adults 20 years and older:  A BMI below 18.5 is considered underweight.  A BMI of 18.5 to 24.9 is normal.  A BMI of 25 to 29.9 is considered overweight.  A BMI of 30 and above is considered obese.  Maintain normal blood lipids and cholesterol by exercising and minimizing your intake of saturated fat. Eat a balanced diet with plenty of fruits and vegetables. Blood tests for lipids and  cholesterol should begin at age 2 and be repeated every 5 years. If your lipid or cholesterol levels are high, you are over 50, or you are a high risk for heart disease, you may need your cholesterol levels checked more frequently.Ongoing high lipid and cholesterol levels should be treated with medicines if diet and exercise are not effective.  If you smoke, find out from your caregiver how to quit. If you do not use tobacco, do not start.  Lung cancer screening is recommended for adults aged 79 80 years who are at high risk for developing lung cancer because of a history of smoking. Yearly low-dose computed tomography (CT) is recommended for people who have at least a 30-pack-year history of smoking and are a current smoker or have quit within the past 15 years. A pack year of smoking is smoking an average of 1 pack of cigarettes a day for 1 year (for example: 1 pack a day for 30 years or 2 packs a day for 15 years). Yearly screening should continue until the smoker has stopped smoking for at least 15 years. Yearly screening should also be stopped for people who develop a health problem that would prevent them from having lung cancer treatment.  If you are pregnant, do not drink alcohol. If you are breastfeeding, be very cautious about drinking alcohol. If you are not pregnant and choose to drink alcohol, do not exceed 1 drink per day. One drink is considered to be 12 ounces (355 mL) of beer, 5 ounces (148 mL) of wine, or 1.5  ounces (44 mL) of liquor.  Avoid use of street drugs. Do not share needles with anyone. Ask for help if you need support or instructions about stopping the use of drugs.  High blood pressure causes heart disease and increases the risk of stroke. Blood pressure should be checked at least every 1 to 2 years. Ongoing high blood pressure should be treated with medicines, if weight loss and exercise are not effective.  If you are 55 to 75 years old, ask your caregiver if you should  take aspirin to prevent strokes.  Diabetes screening involves taking a blood sample to check your fasting blood sugar level. This should be done once every 3 years, after age 45, if you are within normal weight and without risk factors for diabetes. Testing should be considered at a younger age or be carried out more frequently if you are overweight and have at least 1 risk factor for diabetes.  Breast cancer screening is essential preventative care for women. You should practice "breast self-awareness." This means understanding the normal appearance and feel of your breasts and may include breast self-examination. Any changes detected, no matter how small, should be reported to a caregiver. Women in their 20s and 30s should have a clinical breast exam (CBE) by a caregiver as part of a regular health exam every 1 to 3 years. After age 40, women should have a CBE every year. Starting at age 40, women should consider having a mammogram (breast X-ray) every year. Women who have a family history of breast cancer should talk to their caregiver about genetic screening. Women at a high risk of breast cancer should talk to their caregiver about having an MRI and a mammogram every year.  Breast cancer gene (BRCA)-related cancer risk assessment is recommended for women who have family members with BRCA-related cancers. BRCA-related cancers include breast, ovarian, tubal, and peritoneal cancers. Having family members with these cancers may be associated with an increased risk for harmful changes (mutations) in the breast cancer genes BRCA1 and BRCA2. Results of the assessment will determine the need for genetic counseling and BRCA1 and BRCA2 testing.  The Pap test is a screening test for cervical cancer. Women should have a Pap test starting at age 21. Between ages 21 and 29, Pap tests should be repeated every 2 years. Beginning at age 30, you should have a Pap test every 3 years as long as the past 3 Pap tests have  been normal. If you had a hysterectomy for a problem that was not cancer or a condition that could lead to cancer, then you no longer need Pap tests. If you are between ages 65 and 70, and you have had normal Pap tests going back 10 years, you no longer need Pap tests. If you have had past treatment for cervical cancer or a condition that could lead to cancer, you need Pap tests and screening for cancer for at least 20 years after your treatment. If Pap tests have been discontinued, risk factors (such as a new sexual partner) need to be reassessed to determine if screening should be resumed. Some women have medical problems that increase the chance of getting cervical cancer. In these cases, your caregiver may recommend more frequent screening and Pap tests.  The human papillomavirus (HPV) test is an additional test that may be used for cervical cancer screening. The HPV test looks for the virus that can cause the cell changes on the cervix. The cells collected during the Pap test   can be tested for HPV. The HPV test could be used to screen women aged 58 years and older, and should be used in women of any age who have unclear Pap test results. After the age of 45, women should have HPV testing at the same frequency as a Pap test.  Colorectal cancer can be detected and often prevented. Most routine colorectal cancer screening begins at the age of 49 and continues through age 34. However, your caregiver may recommend screening at an earlier age if you have risk factors for colon cancer. On a yearly basis, your caregiver may provide home test kits to check for hidden blood in the stool. Use of a small camera at the end of a tube, to directly examine the colon (sigmoidoscopy or colonoscopy), can detect the earliest forms of colorectal cancer. Talk to your caregiver about this at age 24, when routine screening begins. Direct examination of the colon should be repeated every 5 to 10 years through age 74, unless early  forms of pre-cancerous polyps or small growths are found.  Hepatitis C blood testing is recommended for all people born from [birth year] through 1967 and any individual with known risks for hepatitis C.  Practice safe sex. Use condoms and avoid high-risk sexual practices to reduce the spread of sexually transmitted infections (STIs). Sexually active women aged 39 and younger should be checked for Chlamydia, which is a common sexually transmitted infection. Older women with new or multiple partners should also be tested for Chlamydia. Testing for other STIs is recommended if you are sexually active and at increased risk.  Osteoporosis is a disease in which the bones lose minerals and strength with aging. This can result in serious bone fractures. The risk of osteoporosis can be identified using a bone density scan. Women ages 48 and over and women at risk for fractures or osteoporosis should discuss screening with their caregivers. Ask your caregiver whether you should be taking a calcium supplement or vitamin D to reduce the rate of osteoporosis.  Menopause can be associated with physical symptoms and risks. Hormone replacement therapy is available to decrease symptoms and risks. You should talk to your caregiver about whether hormone replacement therapy is right for you.  Use sunscreen. Apply sunscreen liberally and repeatedly throughout the day. You should seek shade when your shadow is shorter than you. Protect yourself by wearing long sleeves, pants, a wide-brimmed hat, and sunglasses year round, whenever you are outdoors.  Notify your caregiver of new moles or changes in moles, especially if there is a change in shape or color. Also notify your caregiver if a mole is larger than the size of a pencil eraser.  Stay current with your immunizations. Document Released: 03/22/2011 Document Revised: 01/01/2013 Document Reviewed: 03/22/2011 [facility] Patient Information 2014 [place].

## 2014-03-15 LAB — URINALYSIS W MICROSCOPIC + REFLEX CULTURE
BACTERIA UA: NONE SEEN
Bilirubin Urine: NEGATIVE
Casts: NONE SEEN
Crystals: NONE SEEN
Glucose, UA: NEGATIVE mg/dL
HGB URINE DIPSTICK: NEGATIVE
KETONES UR: NEGATIVE mg/dL
NITRITE: NEGATIVE
Protein, ur: NEGATIVE mg/dL
Specific Gravity, Urine: <1.005 — ABNORMAL LOW (ref 1.005–1.030)
Squamous Epithelial / LPF: NONE SEEN
UROBILINOGEN UA: 0.2 mg/dL (ref 0.0–1.0)
pH: 7 (ref 5.0–8.0)

## 2014-03-17 LAB — URINE CULTURE

## 2014-03-18 ENCOUNTER — Encounter (HOSPITAL_COMMUNITY): Payer: Self-pay | Admitting: Cardiology

## 2014-03-18 ENCOUNTER — Inpatient Hospital Stay (HOSPITAL_COMMUNITY)
Admission: EM | Admit: 2014-03-18 | Discharge: 2014-03-20 | DRG: 247 | Disposition: A | Discharge status: Home / Self Care | Payer: Medicare Other | Attending: Interventional Cardiology | Admitting: Interventional Cardiology

## 2014-03-18 ENCOUNTER — Other Ambulatory Visit: Payer: Self-pay | Admitting: Gynecology

## 2014-03-18 ENCOUNTER — Encounter (HOSPITAL_COMMUNITY)
Admission: EM | Disposition: A | Discharge status: Home / Self Care | Payer: Medicare Other | Source: Home / Self Care | Attending: Interventional Cardiology

## 2014-03-18 DIAGNOSIS — E785 Hyperlipidemia, unspecified: Secondary | ICD-10-CM | POA: Diagnosis present

## 2014-03-18 DIAGNOSIS — I2119 ST elevation (STEMI) myocardial infarction involving other coronary artery of inferior wall: Principal | ICD-10-CM | POA: Diagnosis present

## 2014-03-18 DIAGNOSIS — Z9861 Coronary angioplasty status: Secondary | ICD-10-CM

## 2014-03-18 DIAGNOSIS — Z886 Allergy status to analgesic agent status: Secondary | ICD-10-CM

## 2014-03-18 DIAGNOSIS — I2510 Atherosclerotic heart disease of native coronary artery without angina pectoris: Secondary | ICD-10-CM

## 2014-03-18 DIAGNOSIS — N3000 Acute cystitis without hematuria: Secondary | ICD-10-CM

## 2014-03-18 DIAGNOSIS — Z955 Presence of coronary angioplasty implant and graft: Secondary | ICD-10-CM

## 2014-03-18 DIAGNOSIS — N390 Urinary tract infection, site not specified: Secondary | ICD-10-CM | POA: Insufficient documentation

## 2014-03-18 DIAGNOSIS — I213 ST elevation (STEMI) myocardial infarction of unspecified site: Secondary | ICD-10-CM | POA: Diagnosis present

## 2014-03-18 DIAGNOSIS — I2582 Chronic total occlusion of coronary artery: Secondary | ICD-10-CM | POA: Diagnosis present

## 2014-03-18 DIAGNOSIS — E7800 Pure hypercholesterolemia, unspecified: Secondary | ICD-10-CM | POA: Diagnosis present

## 2014-03-18 DIAGNOSIS — Z888 Allergy status to other drugs, medicaments and biological substances status: Secondary | ICD-10-CM

## 2014-03-18 DIAGNOSIS — I2111 ST elevation (STEMI) myocardial infarction involving right coronary artery: Secondary | ICD-10-CM

## 2014-03-18 DIAGNOSIS — K219 Gastro-esophageal reflux disease without esophagitis: Secondary | ICD-10-CM | POA: Diagnosis present

## 2014-03-18 HISTORY — DX: Atherosclerotic heart disease of native coronary artery without angina pectoris: I25.10

## 2014-03-18 HISTORY — DX: ST elevation (STEMI) myocardial infarction involving right coronary artery: I21.11

## 2014-03-18 HISTORY — PX: CORONARY ANGIOPLASTY WITH STENT PLACEMENT: SHX49

## 2014-03-18 HISTORY — PX: LEFT HEART CATH: SHX5478

## 2014-03-18 LAB — COMPREHENSIVE METABOLIC PANEL
ALT: 36 U/L — AB (ref 0–35)
AST: 37 U/L (ref 0–37)
Albumin: 3.7 g/dL (ref 3.5–5.2)
Alkaline Phosphatase: 74 U/L (ref 39–117)
BILIRUBIN TOTAL: 0.3 mg/dL (ref 0.3–1.2)
BUN: 10 mg/dL (ref 6–23)
CALCIUM: 8.7 mg/dL (ref 8.4–10.5)
CO2: 23 meq/L (ref 19–32)
Chloride: 101 mEq/L (ref 96–112)
Creatinine, Ser: 0.65 mg/dL (ref 0.50–1.10)
GFR, EST NON AFRICAN AMERICAN: 85 mL/min — AB (ref >90)
GLUCOSE: 142 mg/dL — AB (ref 70–99)
Potassium: 3.5 mEq/L — ABNORMAL LOW (ref 3.7–5.3)
Sodium: 136 mEq/L — ABNORMAL LOW (ref 137–147)
Total Protein: 6.4 g/dL (ref 6.0–8.3)

## 2014-03-18 LAB — CBC
HCT: 36.4 % (ref 36.0–46.0)
Hemoglobin: 12.1 g/dL (ref 12.0–15.0)
MCH: 32.2 pg (ref 26.0–34.0)
MCHC: 33.2 g/dL (ref 30.0–36.0)
MCV: 96.8 fL (ref 78.0–100.0)
Platelets: 210 10*3/uL (ref 150–400)
RBC: 3.76 MIL/uL — AB (ref 3.87–5.11)
RDW: 12.9 % (ref 11.5–15.5)
WBC: 7.1 10*3/uL (ref 4.0–10.5)

## 2014-03-18 LAB — CK TOTAL AND CKMB (NOT AT ARMC)
CK TOTAL: 436 U/L — AB (ref 7–177)
CK, MB: 8 ng/mL (ref 0.3–4.0)
CK, MB: 53.1 ng/mL (ref 0.3–4.0)
RELATIVE INDEX: INVALID (ref 0.0–2.5)
Relative Index: 12.2 — ABNORMAL HIGH (ref 0.0–2.5)
Total CK: 97 U/L (ref 7–177)

## 2014-03-18 LAB — LIPID PANEL
CHOLESTEROL: 200 mg/dL (ref 0–200)
HDL: 48 mg/dL (ref >39)
LDL CALC: 132 mg/dL — AB (ref 0–99)
Total CHOL/HDL Ratio: 4.2 RATIO
Triglycerides: 100 mg/dL (ref <150)
VLDL: 20 mg/dL (ref 0–40)

## 2014-03-18 LAB — MRSA PCR SCREENING: MRSA BY PCR: NEGATIVE

## 2014-03-18 LAB — PROTIME-INR
INR: 1.11 (ref 0.00–1.49)
PROTHROMBIN TIME: 14.3 s (ref 11.6–15.2)

## 2014-03-18 LAB — APTT: aPTT: 168 seconds — ABNORMAL HIGH (ref 24–37)

## 2014-03-18 LAB — HEMOGLOBIN A1C
Hgb A1c MFr Bld: 5.8 % — ABNORMAL HIGH (ref <5.7)
Mean Plasma Glucose: 120 mg/dL — ABNORMAL HIGH (ref <117)

## 2014-03-18 LAB — TROPONIN I: Troponin I: 0.7 ng/mL (ref <0.30)

## 2014-03-18 SURGERY — LEFT HEART CATH
Anesthesia: LOCAL

## 2014-03-18 MED ORDER — LIDOCAINE HCL (PF) 1 % IJ SOLN
INTRAMUSCULAR | Status: AC
  Filled 2014-03-18: qty 30

## 2014-03-18 MED ORDER — ZOLPIDEM TARTRATE 5 MG PO TABS
5.0000 mg | ORAL_TABLET | Freq: Every evening | ORAL | Status: DC | PRN
  Administered 2014-03-19: 5 mg via ORAL
  Filled 2014-03-18: qty 1

## 2014-03-18 MED ORDER — ATROPINE SULFATE 0.1 MG/ML IJ SOLN
INTRAMUSCULAR | Status: AC
  Filled 2014-03-18: qty 10

## 2014-03-18 MED ORDER — SODIUM CHLORIDE 0.9 % IV SOLN
INTRAVENOUS | Status: DC
  Administered 2014-03-18: 19:00:00 via INTRAVENOUS

## 2014-03-18 MED ORDER — BIVALIRUDIN 250 MG IV SOLR
INTRAVENOUS | Status: AC
  Filled 2014-03-18: qty 250

## 2014-03-18 MED ORDER — TICAGRELOR 90 MG PO TABS
90.0000 mg | ORAL_TABLET | Freq: Two times a day (BID) | ORAL | Status: DC
  Administered 2014-03-19 – 2014-03-20 (×3): 90 mg via ORAL
  Filled 2014-03-18 (×5): qty 1

## 2014-03-18 MED ORDER — HEPARIN SODIUM (PORCINE) 1000 UNIT/ML IJ SOLN
INTRAMUSCULAR | Status: AC
  Filled 2014-03-18: qty 1

## 2014-03-18 MED ORDER — TICAGRELOR 90 MG PO TABS
ORAL_TABLET | ORAL | Status: AC
  Filled 2014-03-18: qty 1

## 2014-03-18 MED ORDER — MIDAZOLAM HCL 2 MG/2ML IJ SOLN
INTRAMUSCULAR | Status: AC
  Filled 2014-03-18: qty 2

## 2014-03-18 MED ORDER — FENTANYL CITRATE 0.05 MG/ML IJ SOLN
INTRAMUSCULAR | Status: AC
  Filled 2014-03-18: qty 2

## 2014-03-18 MED ORDER — VERAPAMIL HCL 2.5 MG/ML IV SOLN
INTRAVENOUS | Status: AC
  Filled 2014-03-18: qty 2

## 2014-03-18 MED ORDER — ATORVASTATIN CALCIUM 80 MG PO TABS
80.0000 mg | ORAL_TABLET | Freq: Every day | ORAL | Status: DC
  Administered 2014-03-19: 80 mg via ORAL
  Filled 2014-03-18 (×3): qty 1

## 2014-03-18 MED ORDER — NITROGLYCERIN 0.4 MG SL SUBL: 0.4000 mg | SUBLINGUAL_TABLET | SUBLINGUAL | Status: DC | PRN

## 2014-03-18 MED ORDER — ONDANSETRON HCL 4 MG/2ML IJ SOLN: 4.0000 mg | Freq: Four times a day (QID) | INTRAMUSCULAR | Status: DC | PRN

## 2014-03-18 MED ORDER — ALPRAZOLAM 0.25 MG PO TABS
0.2500 mg | ORAL_TABLET | Freq: Two times a day (BID) | ORAL | Status: DC | PRN
  Administered 2014-03-19: 0.25 mg via ORAL
  Filled 2014-03-18: qty 1

## 2014-03-18 MED ORDER — NITROGLYCERIN 0.2 MG/ML ON CALL CATH LAB
INTRAVENOUS | Status: AC
  Filled 2014-03-18: qty 1

## 2014-03-18 MED ORDER — SULFAMETHOXAZOLE-TMP DS 800-160 MG PO TABS: 1.0000 | ORAL_TABLET | Freq: Two times a day (BID) | ORAL | Status: DC

## 2014-03-18 MED ORDER — OXYCODONE-ACETAMINOPHEN 5-325 MG PO TABS: 1.0000 | ORAL_TABLET | ORAL | Status: DC | PRN

## 2014-03-18 MED ORDER — BIVALIRUDIN 250 MG IV SOLR
1.7500 mg/kg/h | INTRAVENOUS | Status: DC
  Filled 2014-03-18: qty 250

## 2014-03-18 MED ORDER — HEPARIN (PORCINE) IN NACL 2-0.9 UNIT/ML-% IJ SOLN
INTRAMUSCULAR | Status: AC
  Filled 2014-03-18: qty 1000

## 2014-03-18 MED ORDER — PANTOPRAZOLE SODIUM 40 MG PO TBEC
40.0000 mg | DELAYED_RELEASE_TABLET | Freq: Every day | ORAL | Status: DC
  Administered 2014-03-18 – 2014-03-20 (×3): 40 mg via ORAL
  Filled 2014-03-18 (×3): qty 1

## 2014-03-18 MED ORDER — METOPROLOL TARTRATE 12.5 MG HALF TABLET
12.5000 mg | ORAL_TABLET | Freq: Two times a day (BID) | ORAL | Status: DC
Start: 2014-03-18 — End: 2014-03-20
  Administered 2014-03-18 – 2014-03-20 (×4): 12.5 mg via ORAL
  Filled 2014-03-18 (×6): qty 1

## 2014-03-18 MED ORDER — ACETAMINOPHEN 325 MG PO TABS: 650.0000 mg | ORAL_TABLET | ORAL | Status: DC | PRN

## 2014-03-18 NOTE — H&P (Signed)
Patient ID: KORTNE ALL MRN: [id number], DOB/AGE: 1941/06/05   Admit date: 03/18/2014   Primary Physician: Gilford Rile, MD Primary Cardiologist: Dr Burt Knack (new)  HPI: Pleasant 73 y/o from [place] with no prior history of CAD. She was in her usual state of good health until after lunch today when she developed "indigestion". Thjs was around 1:30 pm. Her symptoms progressed to chest pressure with Lt arm pain, nausea, and diaphoresis. EMS was called. EKG shows inferior ST elevation and the pt was transported urgently for cath. She is symptom free on arrival. She has an ASA allergy with reported swelling and hives in the past- no ASA was administered because of this.    Problem List: Past Medical History  Diagnosis Date  . Hiatal hernia   . Peptic stricture of esophagus   . GERD (gastroesophageal reflux disease)     LA Class Grade C Erosive esophagitis  . Hyperlipidemia   . Internal hemorrhoids   . External hemorrhoids   . Adenomatous polyps 07/1997  . Osteopenia 01/2014     T score -2.0. FRAX 19.8%/4.8% with statistically significant decrease at measured sites  . Breast fibroadenoma     Past Surgical History  Procedure Laterality Date  . Tonsillectomy      age 21  . Thyroid tumor removed    . Bunionectomy Bilateral 04/04/2013  . Abdominal hysterectomy  1977    Leiomyoma     Allergies:  Allergies  Allergen Reactions  . Aspirin     REACTION: hives and diarrhea     Home Medications Current Facility-Administered Medications  Medication Dose Route Frequency Provider Last Rate Last Dose  . acetaminophen (TYLENOL) tablet 650 mg  650 mg Oral Q4H PRN Erlene Quan, PA-C      . ALPRAZolam Duanne Moron) tablet 0.25 mg  0.25 mg Oral BID PRN Erlene Quan, PA-C      . [START ON 03/19/2014] atorvastatin (LIPITOR) tablet 80 mg  80 mg Oral q1800 Doreene Burke Kilroy, PA-C      . nitroGLYCERIN (NITROSTAT) SL tablet 0.4 mg  0.4 mg Sublingual Q5 Min x 3 PRN Luke K Kilroy, PA-C      . ondansetron  [facility]) injection 4 mg  4 mg Intravenous Q6H PRN Doreene Burke Kilroy, PA-C      . zolpidem (AMBIEN) tablet 5 mg  5 mg Oral QHS PRN Erlene Quan, PA-C      Prilosec Fish oil Bactrim DS   Family History  Problem Relation Age of Onset  . Colon cancer Maternal Aunt   . Breast cancer Maternal Aunt     Age 48  . Colon polyps Mother   . Breast cancer Daughter     Age 68  . Diabetes Father   . Heart disease Brother     irregular heart beat     History   Social History  . Marital Status: Married    Spouse Name: N/A    Number of Children: 2  . Years of Education: N/A   Occupational History  . Dance movement psychotherapist    Social History Main Topics  . Smoking status: Never Smoker   . Smokeless tobacco: Never Used  . Alcohol Use: No  . Drug Use: No  . Sexual Activity: Yes    Birth Control/ Protection: Surgical, Post-menopausal     Comment: HYST   Other Topics Concern  . Not on file   Social History Narrative   Daily caffeine  2 cups per day  Review of Systems: General: negative for chills, fever, night sweats or weight changes.  Cardiovascular: negative for previous chest pain, dyspnea on exertion, edema, orthopnea, palpitations, paroxysmal nocturnal dyspnea or shortness of breath Dermatological: negative for rash Respiratory: negative for cough or wheezing Urologic: negative for hematuria- Told by her OB/GYN she had a UTI- Bactrim recently started Abdominal: negative for nausea, vomiting, diarrhea, bright red blood per rectum, melena, or hematemesis Neurologic: negative for visual changes, syncope, or dizziness, No history of stroke or TIA Prior thyroid surgery for benign nodule All other systems reviewed and are otherwise negative except as noted above.  Physical Exam: There were no vitals taken for this visit.  General appearance: alert, cooperative and no distress Neck: no carotid bruit and no JVD Lungs: clear to auscultation bilaterally Heart: regular rate and  rhythm Abdomen: soft, non-tender; bowel sounds normal; no masses,  no organomegaly Extremities: extremities normal, atraumatic, no cyanosis or edema Pulses: 2+ and symmetric Skin: Skin color, texture, turgor normal. No rashes or lesions Neurologic: Grossly normal    Labs:   Results for orders placed during the hospital encounter of 03/18/14 (from the past 24 hour(s))  CBC     Status: Abnormal   Collection Time    03/18/14  5:20 PM      Result Value Ref Range   WBC 7.1  4.0 - 10.5 K/uL   RBC 3.76 (*) 3.87 - 5.11 MIL/uL   Hemoglobin 12.1  12.0 - 15.0 g/dL   HCT 36.4  36.0 - 46.0 %   MCV 96.8  78.0 - 100.0 fL   MCH 32.2  26.0 - 34.0 pg   MCHC 33.2  30.0 - 36.0 g/dL   RDW 12.9  11.5 - 15.5 %   Platelets 210  150 - 400 K/uL  PROTIME-INR     Status: None   Collection Time    03/18/14  5:20 PM      Result Value Ref Range   Prothrombin Time 14.3  11.6 - 15.2 seconds   INR 1.11  0.00 - 1.49  APTT     Status: Abnormal   Collection Time    03/18/14  5:20 PM      Result Value Ref Range   aPTT 168 (*) 24 - 37 seconds     Radiology/Studies: No results found.  EKG:NSR inferior ST elevation  ASSESSMENT AND PLAN:  Principal Problem:   STEMI (ST elevation myocardial infarction) Active Problems:   HYPERCHOLESTEROLEMIA   GERD   UTI (urinary tract infection)   History of allergy to aspirin- Hives, edema   PLAN: Urgent cath   Signed,  Patient seen, examined. Available data reviewed. Agree with findings, assessment, and plan as outlined by Kerin Ransom, PA-C. 73 year old woman presenting with an acute inferolateral ST segment elevation MI. Her symptoms began within the last 6 hours. She reported abrupt onset of nausea, chest pressure, and left arm discomfort as described above. Considering her persistent ST segment elevation, we plan to proceed with emergency cardiac catheterization and possible PCI. Emergency implied consent for the procedure was obtained. Her exam reveals an alert,  oriented woman in no distress. Initial blood pressure was 138/84, heart rate 68, respiratory rate 16. Lungs are clear. Rate and rhythm without murmurs or gallops. Abdomen is soft nontender. There is no peripheral edema. Dorsalis pedis and posterior tibial pulses are 2+ and equal bilaterally.  Further plans pending the results of her cardiac catheterization and initial lab and radiographic data.  [name], M.D. 03/18/2014 6:03  PM

## 2014-03-18 NOTE — Interval H&P Note (Signed)
History and Physical Interval Note:  03/18/2014 6:06 PM  Sarah Ray  has presented today for surgery, with the diagnosis of STEMI  The various methods of treatment have been discussed with the patient and family. After consideration of risks, benefits and other options for treatment, the patient has consented to  Procedure(s): LEFT HEART CATH (N/A) as a surgical intervention .  The patient's history has been reviewed, patient examined, no change in status, stable for surgery.  I have reviewed the patient's chart and labs.  Questions were answered to the patient's satisfaction.    Cath Lab Visit (complete for each Cath Lab visit)  Clinical Evaluation Leading to the Procedure:   ACS: Yes.    Non-ACS:    Anginal Classification: CCS IV  Anti-ischemic medical therapy: No Therapy  Non-Invasive Test Results: No non-invasive testing performed  Prior CABG: No previous CABG       [name]

## 2014-03-18 NOTE — CV Procedure (Signed)
Cardiac Catheterization Procedure Note  Name: Sarah Ray MRN: [id number] DOB: April 29, 1941  Procedure: Left Heart Cath, Selective Coronary Angiography, LV angiography, PTCA and stenting of the RCA  Indication: Acute inferolateral STEMI  Procedural Details:  The right wrist was prepped, draped, and anesthetized with 1% lidocaine. Using the modified Seldinger technique, a 5/6 French sheath was introduced into the right radial artery. 3 mg of verapamil was administered through the sheath, weight-based unfractionated heparin was administered intravenously. Angiomax was started as soon as the coronary anatomy was defined. Standard Judkins catheters were used for selective coronary angiography and left ventriculography. Catheter exchanges were performed over an exchange length guidewire.  PROCEDURAL FINDINGS Hemodynamics: AO 86/53 LV 86/18   Coronary angiography: Coronary dominance: right  Left mainstem: The left main is a long segment with no obstructive disease. Divides into the LAD and left circumflex  Left anterior descending (LAD): The LAD is a large caliber vessel, wrapping around the left ventricular apex. The proximal vessel has 20-30% stenosis. The mid vessel has minor nonobstructive plaquing with a segment of intramyocardial bridging. The first diagonal is medium in caliber with mild proximal stenosis. The second diagonal is small in caliber with 50% proximal stenosis.  Left circumflex (LCx): The left circumflex is patent with nonobstructive 30-40% proximal vessel plaquing. There is a single obtuse marginal branch with no significant stenosis.  Right coronary artery (RCA): The RCA is dominant. There is diffuse proximal vessel and mid vessel plaque. The vessel is totally occluded at the junction of the mid and distal vessel with TIMI 0 flow.  Left ventriculography: There is mild basal inferior hypokinesis with vigorous overall LV function, LVEF estimated at 65%.    PCI Note:   Following the diagnostic procedure, the decision was made to proceed with PCI.  Weight-based bivalirudin was given for anticoagulation. Brilinta 180 mg was administered on the table. The patient is aspirin allergic, therefore aspirin is contraindicated. Once a therapeutic ACT was achieved, a 6 [place] JR 4 guide catheter was inserted.  A cougar coronary guidewire was used to cross the lesion.  The lesion was predilated with a 2.5 x 15 mm balloon.  With reperfusion, the patient developed bradycardia and hypotension. Atropine 0.5 mg was administered intravenously. She had a short run of rapid atrial fibrillation, but then converted back to sinus rhythm after a period of an accelerated idioventricular rhythm. The lesion was then stented with a 3.0x18 mm Xience drug-eluting stent.  The stent was postdilated with a 3.5x15 mm noncompliant balloon to 14 atm.  Following PCI, there was 0% residual stenosis and TIMI-3 flow. Final angiography confirmed an excellent result. The patient tolerated the procedure well. There were no immediate procedural complications. A TR band was used for radial hemostasis. The patient was transferred to the post catheterization recovery area for further monitoring.  PCI Data: Vessel - RCA/Segment - mid Percent Stenosis (pre)  100 TIMI-flow 0 Stent 3.0x18 mm Xience DES Percent Stenosis (post) 0 TIMI-flow (post) 3  Final Conclusions:   1. Acute inferolateral STEMI secondary to RCA occlusion, treated successfully with primary PCI using a drug-eluting stent  2. Mild nonobstructive LAD and left circumflex stenoses  3. Preserved LV systolic function with an estimated LVEF of 65%.   Recommendations:  With her aspirin allergy, would recommend long-term brilinta as tolerated. Will need aggressive post MI medical therapy to include a statin and beta blocker. If the patient remains hemodynamically stable without recurrent symptoms of ischemia, she could be considered for transfer  out of  the CICU tomorrow am and 48 hour hospital discharge.  [name] 03/18/2014, 6:16 PM

## 2014-03-19 DIAGNOSIS — E7800 Pure hypercholesterolemia, unspecified: Secondary | ICD-10-CM

## 2014-03-19 DIAGNOSIS — N3000 Acute cystitis without hematuria: Secondary | ICD-10-CM

## 2014-03-19 LAB — LIPID PANEL
Cholesterol: 185 mg/dL (ref 0–200)
HDL: 40 mg/dL (ref >39)
LDL Cholesterol: 114 mg/dL — ABNORMAL HIGH (ref 0–99)
Total CHOL/HDL Ratio: 4.6 RATIO
Triglycerides: 157 mg/dL — ABNORMAL HIGH (ref <150)
VLDL: 31 mg/dL (ref 0–40)

## 2014-03-19 LAB — BASIC METABOLIC PANEL
BUN: 8 mg/dL (ref 6–23)
CHLORIDE: 105 meq/L (ref 96–112)
CO2: 21 mEq/L (ref 19–32)
Calcium: 8.9 mg/dL (ref 8.4–10.5)
Creatinine, Ser: 0.67 mg/dL (ref 0.50–1.10)
GFR calc non Af Amer: 84 mL/min — ABNORMAL LOW (ref >90)
GLUCOSE: 107 mg/dL — AB (ref 70–99)
POTASSIUM: 3.5 meq/L — AB (ref 3.7–5.3)
Sodium: 139 mEq/L (ref 137–147)

## 2014-03-19 LAB — POCT I-STAT, CHEM 8
BUN: 8 mg/dL (ref 6–23)
CALCIUM ION: 1.23 mmol/L (ref 1.13–1.30)
Chloride: 101 mEq/L (ref 96–112)
Creatinine, Ser: 0.7 mg/dL (ref 0.50–1.10)
Glucose, Bld: 144 mg/dL — ABNORMAL HIGH (ref 70–99)
HCT: 37 % (ref 36.0–46.0)
Hemoglobin: 12.6 g/dL (ref 12.0–15.0)
Potassium: 3.5 mEq/L — ABNORMAL LOW (ref 3.7–5.3)
Sodium: 137 mEq/L (ref 137–147)
TCO2: 21 mmol/L (ref 0–100)

## 2014-03-19 LAB — POCT I-STAT 3, ART BLOOD GAS (G3+)
Acid-base deficit: 2 mmol/L (ref 0.0–2.0)
Bicarbonate: 22.8 mEq/L (ref 20.0–24.0)
O2 SAT: 91 %
TCO2: 24 mmol/L (ref 0–100)
pCO2 arterial: 39.5 mmHg (ref 35.0–45.0)
pH, Arterial: 7.37 (ref 7.350–7.450)
pO2, Arterial: 63 mmHg — ABNORMAL LOW (ref 80.0–100.0)

## 2014-03-19 LAB — CK TOTAL AND CKMB (NOT AT ARMC)
CK TOTAL: 1093 U/L — AB (ref 7–177)
CK TOTAL: 1280 U/L — AB (ref 7–177)
CK, MB: 117.1 ng/mL (ref 0.3–4.0)
CK, MB: 119 ng/mL (ref 0.3–4.0)
Relative Index: 10.7 — ABNORMAL HIGH (ref 0.0–2.5)
Relative Index: 9.3 — ABNORMAL HIGH (ref 0.0–2.5)

## 2014-03-19 LAB — CK: CK TOTAL: 1248 U/L — AB (ref 7–177)

## 2014-03-19 LAB — POCT ACTIVATED CLOTTING TIME: Activated Clotting Time: 405 seconds

## 2014-03-19 LAB — CBC
HEMATOCRIT: 33.1 % — AB (ref 36.0–46.0)
Hemoglobin: 11.1 g/dL — ABNORMAL LOW (ref 12.0–15.0)
MCH: 32.4 pg (ref 26.0–34.0)
MCHC: 33.5 g/dL (ref 30.0–36.0)
MCV: 96.5 fL (ref 78.0–100.0)
Platelets: 175 10*3/uL (ref 150–400)
RBC: 3.43 MIL/uL — AB (ref 3.87–5.11)
RDW: 13 % (ref 11.5–15.5)
WBC: 6.3 10*3/uL (ref 4.0–10.5)

## 2014-03-19 MED ORDER — PANTOPRAZOLE SODIUM 40 MG PO TBEC: 40.0000 mg | DELAYED_RELEASE_TABLET | Freq: Every day | ORAL | Status: DC

## 2014-03-19 MED FILL — Sodium Chloride IV Soln 0.9%: INTRAVENOUS | Qty: 50 | Status: AC

## 2014-03-19 NOTE — Progress Notes (Addendum)
   SUBJECTIVE:  No chest pain.  No dysuria  OBJECTIVE:   Vitals:   Filed Vitals:   03/19/14 0500 03/19/14 0600 03/19/14 0617 03/19/14 0700  BP: 92/44 83/51 101/52 109/58  Pulse: 58 57 57 57  Temp:    98.3 F (36.8 C)  TempSrc:    Oral  Resp: 22 21 17 22   Height:      Weight:      SpO2: 95% 93% 98% 98%   I&O's:   Intake/Output Summary (Last 24 hours) at 03/19/14 0950 Last data filed at 03/19/14 0800  Gross per 24 hour  Intake 806.25 ml  Output    802 ml  Net   4.25 ml   TELEMETRY: Reviewed telemetry pt in NSR, sinus bradycardia     PHYSICAL EXAM General: Well developed, well nourished, in no acute distress Head:   Normal cephalic and atramatic  Lungs:   Clear bilaterally to auscultation. Heart:   HRRR S1 S2  No JVD.   Abdomen: abdomen soft and non-tender Msk:  Back normal,  Normal strength and tone for age. Extremities:   No edema.   Neuro: Alert and oriented. Psych:  Normal affect, responds appropriately   LABS: Basic Metabolic Panel:  Recent Labs  03/18/14 1720 03/19/14 0529  NA 136* 139  K 3.5* 3.5*  CL 101 105  CO2 23 21  GLUCOSE 142* 107*  BUN 10 8  CREATININE 0.65 0.67  CALCIUM 8.7 8.9   Liver Function Tests:  Recent Labs  03/18/14 1720  AST 37  ALT 36*  ALKPHOS 74  BILITOT 0.3  PROT 6.4  ALBUMIN 3.7   No results found for this basename: LIPASE, AMYLASE,  in the last 72 hours CBC:  Recent Labs  03/18/14 1720 03/19/14 0529  WBC 7.1 6.3  HGB 12.1 11.1*  HCT 36.4 33.1*  MCV 96.8 96.5  PLT 210 175   Cardiac Enzymes:  Recent Labs  03/18/14 1720 03/18/14 1940 03/19/14 0530  CKTOTAL 97 436* 1093*  CKMB 8.0* 53.1* 117.1*  TROPONINI 0.70*  --   --    BNP: No components found with this basename: POCBNP,  D-Dimer: No results found for this basename: DDIMER,  in the last 72 hours Hemoglobin A1C:  Recent Labs  03/18/14 1720  HGBA1C 5.8*   Fasting Lipid Panel:  Recent Labs  03/19/14 0529  CHOL 185  HDL 40  LDLCALC  114*  TRIG 157*  CHOLHDL 4.6   Thyroid Function Tests: No results found for this basename: TSH, T4TOTAL, FREET3, T3FREE, THYROIDAB,  in the last 72 hours Anemia Panel: No results found for this basename: VITAMINB12, FOLATE, FERRITIN, TIBC, IRON, RETICCTPCT,  in the last 72 hours Coag Panel:   Lab Results  Component Value Date   INR 1.11 03/18/2014    RADIOLOGY: No results found.    ASSESSMENT: Acute inferior MI  PLAN:  S/p DES to RCA.  On Brilinta.  Allergic to aspirin.    LDL 114 today.  Atorvastatin started.  Borderline HR and BP.  Will continue low dose metoprolol.  Check CK later today.  ABx were called in for early UTI prior to her MI.  Patient never started the med.  Patient asymptomatic at this time.  Hold off on Bactrim.  Transfer to tele.  Anticipate d/c tomorrow afternoon, about 48 hours post MI.  Jettie Booze., MD  03/19/2014  9:50 AM

## 2014-03-19 NOTE — Progress Notes (Signed)
CARDIAC REHAB PHASE I   PRE:  Rate/Rhythm: 68 SR  BP:  Supine:   Sitting: 92/70  Standing:    SaO2:   MODE:  Ambulation: 350 ft   POST:  Rate/Rhythm: 71 SR  BP:  Supine:   Sitting: 123/73  Standing:    SaO2:  1115-1125 Pt tolerated ambulation well without c/o of cp or SOB. S stable. Started MI and stent education with pt and husband. Pt voices understanding. Pt agrees to [place]. CRP in , will send referral. We will follow pt in the am to complete education.  Rodney Langton RN 03/19/2014 12:18 PM

## 2014-03-19 NOTE — Plan of Care (Signed)
Problem: Phase I Progression Outcomes Goal: Aspirin unless contraindicated Outcome: Not Applicable Date Met:  46/56/81 allergy

## 2014-03-20 ENCOUNTER — Encounter (HOSPITAL_COMMUNITY): Payer: Self-pay | Admitting: Cardiology

## 2014-03-20 DIAGNOSIS — E785 Hyperlipidemia, unspecified: Secondary | ICD-10-CM

## 2014-03-20 DIAGNOSIS — I2119 ST elevation (STEMI) myocardial infarction involving other coronary artery of inferior wall: Secondary | ICD-10-CM

## 2014-03-20 LAB — BASIC METABOLIC PANEL
BUN: 11 mg/dL (ref 6–23)
CALCIUM: 8.9 mg/dL (ref 8.4–10.5)
CO2: 24 meq/L (ref 19–32)
CREATININE: 0.89 mg/dL (ref 0.50–1.10)
Chloride: 108 mEq/L (ref 96–112)
GFR calc Af Amer: 72 mL/min — ABNORMAL LOW (ref >90)
GFR, EST NON AFRICAN AMERICAN: 62 mL/min — AB (ref >90)
GLUCOSE: 96 mg/dL (ref 70–99)
Potassium: 4 mEq/L (ref 3.7–5.3)
SODIUM: 144 meq/L (ref 137–147)

## 2014-03-20 MED ORDER — METOPROLOL TARTRATE 25 MG PO TABS: 12.5000 mg | ORAL_TABLET | Freq: Two times a day (BID) | ORAL | Status: DC

## 2014-03-20 MED ORDER — ATORVASTATIN CALCIUM 80 MG PO TABS: 80.0000 mg | ORAL_TABLET | Freq: Every day | ORAL | Status: DC

## 2014-03-20 MED ORDER — PANTOPRAZOLE SODIUM 40 MG PO TBEC: 40.0000 mg | DELAYED_RELEASE_TABLET | Freq: Every day | ORAL | Status: DC

## 2014-03-20 MED ORDER — TICAGRELOR 90 MG PO TABS: 90.0000 mg | ORAL_TABLET | Freq: Two times a day (BID) | ORAL | Status: DC

## 2014-03-20 MED ORDER — NITROGLYCERIN 0.4 MG SL SUBL: 0.4000 mg | SUBLINGUAL_TABLET | SUBLINGUAL | Status: DC | PRN

## 2014-03-20 MED ORDER — TICAGRELOR 90 MG PO TABS
90.0000 mg | ORAL_TABLET | Freq: Two times a day (BID) | ORAL | Status: DC
Start: 2014-03-20 — End: 2014-03-20

## 2014-03-20 NOTE — Care Management Note (Signed)
    Page 1 of 1   03/20/2014     2:09:44 PM CARE MANAGEMENT NOTE 03/20/2014  Patient:  Sarah Ray, Sarah Ray   Account Number:  [id number]  Date Initiated:  03/20/2014  Documentation initiated by:  Aira Sallade  Subjective/Objective Assessment:   Pt adm on 03/18/14 s/p STEMI requiring PCI.  PTA, pt independent, lives with spouse.     Action/Plan:   CM referral for Brilinta.   Anticipated DC Date:  03/20/2014   Anticipated DC Plan:  [place]  CM consult  Medication Assistance      Choice offered to / List presented to:             Status of service:  Completed, signed off Medicare Important Message given?  NA - LOS <3 / Initial given by admissions (If response is "NO", the following Medicare IM given date fields will be blank) Date Medicare IM given:   Medicare IM given by:   Date Additional Medicare IM given:   Additional Medicare IM given by:    Discharge Disposition:  HOME/SELF CARE  Per UR Regulation:  Reviewed for med. necessity/level of care/duration of stay  If discussed at [place] of Stay Meetings, dates discussed:    Comments:  03/20/14 Ellan Lambert, RN, BSN [phone]  PT COPAY WILL BE $40- NO PRIOR [place]  Pt given Brilinta booklet with free 30 day trial card included.  Pt/husband  informed of copay information.

## 2014-03-20 NOTE — Discharge Instructions (Signed)
Take 1 NTG, under your tongue, while sitting.  If no relief of pain may repeat NTG, one tab every 5 minutes up to 3 tablets total over 15 minutes.  If no relief CALL 911.  If you have dizziness/lightheadness  while taking NTG, stop taking and call 911.        Call [facility] [phone] if any bleeding, swelling or drainage at cath site.  May shower, no tub baths for 48 hours for groin sticks. No lifting over 5 pounds for a week.  No driving for a week.  Heart Healthy diet.

## 2014-03-20 NOTE — Progress Notes (Signed)
Subjective: Had brief chest pressure last pm no complaints this am  Objective: Vital signs in last 24 hours: Temp:  [98.2 F (36.8 C)-98.4 F (36.9 C)] 98.2 F (36.8 C) (07/01 0422) Pulse Rate:  [61-65] 61 (07/01 0422) Resp:  [18] 18 (07/01 0422) BP: (92-128)/(64-70) 128/67 mmHg (07/01 0422) SpO2:  [98 %-99 %] 98 % (07/01 0422) Weight change:  Last BM Date: 03/18/14 Intake/Output from previous day: -820 06/30 0701 - 07/01 0700 In: 480 [P.O.:480] Out: 1300 [Urine:1300] Intake/Output this shift:    PE: General:Pleasant affect, NAD Skin:Warm and dry, brisk capillary refill HEENT:normocephalic, sclera clear, mucus membranes moist Heart:S1S2 RRR without murmur, gallup, rub or click Lungs:clear without rales, rhonchi, or wheezes TDV:VOHY, non tender, + BS, do not palpate liver spleen or masses Ext:no lower ext edema, 2+ pedal pulses, 2+ radial pulses Neuro:alert and oriented, MAE, follows commands, + facial symmetry TELE:  SR with NSVT 4 beats yesterday, HR in the 40s during the night.  Some idioventricular rhythm 2 beats.  Lab Results:  Recent Labs  03/18/14 1720 03/18/14 1725 03/19/14 0529  WBC 7.1  --  6.3  HGB 12.1 12.6 11.1*  HCT 36.4 37.0 33.1*  PLT 210  --  175   BMET  Recent Labs  03/19/14 0529 03/20/14 0446  NA 139 144  K 3.5* 4.0  CL 105 108  CO2 21 24  GLUCOSE 107* 96  BUN 8 11  CREATININE 0.67 0.89  CALCIUM 8.9 8.9    Recent Labs  03/18/14 1720  TROPONINI 0.70*    Lab Results  Component Value Date   CHOL 185 03/19/2014   HDL 40 03/19/2014   LDLCALC 114* 03/19/2014   TRIG 157* 03/19/2014   CHOLHDL 4.6 03/19/2014   Lab Results  Component Value Date   HGBA1C 5.8* 03/18/2014     No results found for this basename: TSH    Hepatic Function Panel  Recent Labs  03/18/14 1720  PROT 6.4  ALBUMIN 3.7  AST 37  ALT 36*  ALKPHOS 74  BILITOT 0.3    Recent Labs  03/19/14 0529  CHOL 185   No results found for this  basename: PROTIME,  in the last 72 hours     Studies/Results: No results found.  Medications: I have reviewed the patient's current medications. Scheduled Meds: . atorvastatin  80 mg Oral q1800  . metoprolol tartrate  12.5 mg Oral BID  . pantoprazole  40 mg Oral Daily  . ticagrelor  90 mg Oral BID   Continuous Infusions:  PRN Meds:.acetaminophen, ALPRAZolam, nitroGLYCERIN, ondansetron (ZOFRAN) IV, oxyCODONE-acetaminophen, zolpidem  Assessment/Plan: Principal Problem:   STEMI (ST elevation myocardial infarction), RCA occlusion, emergent PCI with Xience DES  (no ASA secondary to allergy with hives, swelling)  Pk CK 1248 with 119 MB. Continue to ambulate poss discharge this afternoon.  On Brilinta. Active Problems:   HYPERCHOLESTEROLEMIA- on atorvastatin 80-new   GERD-was on Prilosec as outpt.   UTI (urinary tract infection)- held bactrim, no symptoms and neg. Nitrites, + Klebsiella on culture   History of allergy to aspirin- Hives, edema   Acute MI, inferolateral wall, initial episode of care, preserved LV systolic function   Inferolateral myocardial infarction   CAD residual mild non obstructive LAD and LCX disease   NSVT- few 3-4 beats yesterday, but also with HR in the 40s at times during the night on 12.5 lopressor BID.  Pt would like follow up with Dr. Johnsie Cancel- he is  her husband's MD.    LOS: 2 days   Time spent with pt. :15 minutes. [facility] R  Nurse Practitioner Certified Pager [phone] or after 5pm and on weekends call [phone] 03/20/2014, 7:59 AM  Patient seen and examined.  Agree with findings of L [place]   Patient ambulating  No difficulties If does oK I feel she is stable to go home later this afternoon.    Dorris Carnes

## 2014-03-20 NOTE — Discharge Summary (Signed)
Physician Discharge Summary       Patient ID: Sarah Ray MRN: [id number] DOB/AGE: 03-31-1941 73 y.o.  Admit date: 03/18/2014 Discharge date: 03/20/2014  Discharge Diagnoses:  Principal Problem:   STEMI (ST elevation myocardial infarction) Active Problems:   Acute MI, inferolateral wall, initial episode of care   HYPERCHOLESTEROLEMIA   History of allergy to aspirin- Hives, edema   GERD   Inferolateral myocardial infarction   Discharged Condition: good  Procedures: cardiac cath emergent by Dr. Burt Knack 03/18/14 PCI with Xience DES to totally occluded RCA by Dr. Burt Knack 03/18/14     Hospital Course: 73 y/o female from [place] with no prior history of CAD. She was in her usual state of good health until after lunch 03/18/14 when she developed "indigestion". Thjs was around 1:30 pm. Her symptoms progressed to chest pressure with Lt arm pain, nausea, and diaphoresis. EMS was called. EKG shows inferior ST elevation and the pt was transported urgently for cath. She was symptom free on arrival. She has an ASA allergy with reported swelling and hives in the past- no ASA was administered because of this.   She was taken emergently to the cath lab and had Stent -DES to RCA.  EF 65%,  Also with non obstructive disease in the LAD and LCX.  Post procedure she had dip of BP to 70s for short period of time.  By the morning of th 30th she was stable without complaints.  She was transferred to tele.  Her HR at times with sleep in the 40s.  BB at 12.5 BID and not in increased. She was started on lipitor for her cholesterol. She had been prescribed bactrim for UTI by PCP-she had no symptoms found incidentally- Dr. Irish Lack did reviewed new u/a and with neg nitrite d/c'd bactrim.   03/20/14 she was stable and seen and evaluated by Dr. Harrington Challenger and found stable for discharge in the late afternoon.  She does have ecchymosis of rt. Wrist, no pain. She ambulated with cardiac rehab without problems.  At discharge no ASA  secondary to allergy with hives. It is recommended she be on long term Brilinta.  She is on BB, Brilinta and lipitor. She will follow up with Dr. Johnsie Cancel -at her request, he is her husband's physician.   Consults: None  Significant Diagnostic Studies:  BMET    Component Value Date/Time   NA 144 03/20/2014 0446   K 4.0 03/20/2014 0446   CL 108 03/20/2014 0446   CO2 24 03/20/2014 0446   GLUCOSE 96 03/20/2014 0446   BUN 11 03/20/2014 0446   CREATININE 0.89 03/20/2014 0446   CALCIUM 8.9 03/20/2014 0446   GFRNONAA 62* 03/20/2014 0446   GFRAA 72* 03/20/2014 0446    CBC    Component Value Date/Time   WBC 6.3 03/19/2014 0529   RBC 3.43* 03/19/2014 0529   HGB 11.1* 03/19/2014 0529   HCT 33.1* 03/19/2014 0529   PLT 175 03/19/2014 0529   MCV 96.5 03/19/2014 0529   MCH 32.4 03/19/2014 0529   MCHC 33.5 03/19/2014 0529   RDW 13.0 03/19/2014 0529    Lipid Panel     Component Value Date/Time   CHOL 185 03/19/2014 0529   TRIG 157* 03/19/2014 0529   HDL 40 03/19/2014 0529   CHOLHDL 4.6 03/19/2014 0529   VLDL 31 03/19/2014 0529   LDLCALC 114* 03/19/2014 0529    Troponin on admit 0.70,  CK/MB #1-97/8.0, #2-436/53, #3 1248/119  HgBA1C 5.8     Discharge Exam:  Blood pressure 133/82, pulse 67, temperature 98.3 F (36.8 C), temperature source Oral, resp. rate 19, height 5\' 1"  (1.549 m), weight 141 lb 7.9 oz (64.18 kg), SpO2 98.00%.  Disposition: Final discharge disposition not confirmed      Discharge Instructions   Amb Referral to Cardiac Rehabilitation    Complete by:  As directed   Pt agrees to [place]. CRP in [place], will send referral.            Medication List    STOP taking these medications       omeprazole 20 MG capsule  Commonly known as:  PRILOSEC  Replaced by:  pantoprazole 40 MG tablet     sulfamethoxazole-trimethoprim 800-160 MG per tablet  Commonly known as:  BACTRIM DS      TAKE these medications       atorvastatin 80 MG tablet  Commonly known as:  LIPITOR  Take 1 tablet (80 mg  total) by mouth daily at 6 PM.     CALCIUM 600 + D PO  Take 1 tablet by mouth 2 (two) times daily.     Fish Oil 1000 MG Caps  Take 1 capsule by mouth 2 (two) times daily.     metoprolol tartrate 25 MG tablet  Commonly known as:  LOPRESSOR  Take 0.5 tablets (12.5 mg total) by mouth 2 (two) times daily.     nitroGLYCERIN 0.4 MG SL tablet  Commonly known as:  NITROSTAT  Place 1 tablet (0.4 mg total) under the tongue every 5 (five) minutes x 3 doses as needed for chest pain.     nystatin-triamcinolone ointment  Commonly known as:  MYCOLOG  Apply 1 application topically daily as needed (for rash).     pantoprazole 40 MG tablet  Commonly known as:  PROTONIX  Take 1 tablet (40 mg total) by mouth daily.     ticagrelor 90 MG Tabs tablet  Commonly known as:  BRILINTA  Take 1 tablet (90 mg total) by mouth 2 (two) times daily.       Follow-up Information   Follow up with Jenkins Rouge, MD On 03/28/2014. (at 9:00AM)    Specialty:  Cardiology   Contact information:   [address] [address] [phone]        Discharge Instructions: Take 1 NTG, under your tongue, while sitting.  If no relief of pain may repeat NTG, one tab every 5 minutes up to 3 tablets total over 15 minutes.  If no relief CALL 911.  If you have dizziness/lightheadness  while taking NTG, stop taking and call 911.        Call [facility] [phone] if any bleeding, swelling or drainage at cath site.  May shower, no tub baths for 48 hours for groin sticks. No lifting over 5 pounds for a week.  No driving for a week.  Heart Healthy diet.   Signed: Isaiah Serge Nurse Practitioner-Certified [facility]: [facility] 03/20/2014, 2:32 PM  Time spent on discharge :> 30 minutes.

## 2014-03-20 NOTE — Progress Notes (Signed)
CARDIAC REHAB PHASE I   PRE:  Rate/Rhythm: 66 SR    BP: sitting 117/66    SaO2:   MODE:  Ambulation: 510 ft   POST:  Rate/Rhythm: 85 SR    BP: sitting 113/61     SaO2:   Tolerated well. No c/o. Ed completed with good comprehension. Reinforced importance of Brilinta and statin. [id number]   Josephina Shih [place] CES, ACSM 03/20/2014 9:28 AM

## 2014-03-28 ENCOUNTER — Encounter: Payer: Self-pay | Admitting: Cardiovascular Disease

## 2014-03-28 ENCOUNTER — Ambulatory Visit (INDEPENDENT_AMBULATORY_CARE_PROVIDER_SITE_OTHER): Payer: Medicare Other | Admitting: Cardiovascular Disease

## 2014-03-28 VITALS — BP 130/77 | HR 76 | Ht 60.0 in | Wt 136.0 lb

## 2014-03-28 DIAGNOSIS — I2129 ST elevation (STEMI) myocardial infarction involving other sites: Secondary | ICD-10-CM

## 2014-03-28 DIAGNOSIS — E7800 Pure hypercholesterolemia, unspecified: Secondary | ICD-10-CM

## 2014-03-28 NOTE — Patient Instructions (Signed)
Your physician wants you to follow-up in:  [place]  ECHO  [place] will receive a reminder letter in the mail two months in advance. If you don't receive a letter, please call our office to schedule the follow-up appointment. Your physician recommends that you continue on your current medications as directed. Please refer to the Current Medication list given to you today.  Your physician has requested that you have an echocardiogram. Echocardiography is a painless test that uses sound waves to create images of your heart. It provides your doctor with information about the size and shape of your heart and how well your heart's chambers and valves are working. This procedure takes approximately one hour. There are no restrictions for this procedure.  IN  3 MONTHS

## 2014-03-28 NOTE — Assessment & Plan Note (Signed)
Stable with no angina and good activity level.  Continue medical Rx Will stay on Brillinta indefinitely since she cannot take asa  F/U echo in 3 months

## 2014-03-28 NOTE — Assessment & Plan Note (Signed)
Cholesterol is at goal.  Continue current dose of statin and diet Rx.  No myalgias or side effects.  F/U  LFT's in 6 months. Lab Results  Component Value Date   LDLCALC 114* 03/19/2014

## 2014-03-28 NOTE — Progress Notes (Signed)
Patient ID: Sarah Ray, female   DOB: 1941/01/29, 73 y.o.   MRN: [id number]    73 yo seen by Dr Burt Knack 03/18/14 as acute inferior wall MI.  I take care of her husband Sarah Ray and she wanted to f/u with me.  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  Had single vessel disease with mid RCA occlusion No left sided disease no left to right collaterals  Excellent angiographic result EF in normal range with inferobasal akinesis only  D/C 48 hrs fast track  Has had angioedema with aspirin and taking brillinta as anti anginal Some bruising of right radial sight but no other issues      ROS: Denies fever, malais, weight loss, blurry vision, decreased visual acuity, cough, sputum, SOB, hemoptysis, pleuritic pain, palpitaitons, heartburn, abdominal pain, melena, lower extremity edema, claudication, or rash.  All other systems reviewed and negative   General: Affect appropriate Healthy:  appears stated age 73: normal Neck supple with no adenopathy JVP normal no bruits no thyromegaly Lungs clear with no wheezing and good diaphragmatic motion Heart:  S1/S2 no murmur,rub, gallop or click PMI normal Abdomen: benighn, BS positve, no tenderness, no AAA no bruit.  No HSM or HJR Distal pulses intact with no bruits No edema Neuro non-focal Skin right radial palpable with echymosis no hematoma No muscular weakness  Medications Current Outpatient Prescriptions  Medication Sig Dispense Refill  . atorvastatin (LIPITOR) 80 MG tablet Take 1 tablet (80 mg total) by mouth daily at 6 PM.  30 tablet  6  . Calcium Carbonate-Vitamin D (CALCIUM 600 + D PO) Take 1 tablet by mouth 2 (two) times daily.       . metoprolol tartrate (LOPRESSOR) 25 MG tablet Take 0.5 tablets (12.5 mg total) by mouth 2 (two) times daily.  30 tablet  6  . nitroGLYCERIN (NITROSTAT) 0.4 MG SL tablet Place 1 tablet (0.4 mg total)  under the tongue every 5 (five) minutes x 3 doses as needed for chest pain.  25 tablet  4  . nystatin-triamcinolone ointment (MYCOLOG) Apply 1 application topically daily as needed (for rash).      . Omega-3 Fatty Acids (FISH OIL) 1000 MG CAPS Take 1 capsule by mouth 2 (two) times daily.       . pantoprazole (PROTONIX) 40 MG tablet Take 1 tablet (40 mg total) by mouth daily.  30 tablet  6  . ticagrelor (BRILINTA) 90 MG TABS tablet Take 1 tablet (90 mg total) by mouth 2 (two) times daily.  60 tablet  11   No current facility-administered medications for this visit.    Allergies Aspirin  Family History: Family History  Problem Relation Age of Onset  . Colon cancer Maternal Aunt   . Breast cancer Maternal Aunt     Age 107  . Colon polyps Mother   . Breast cancer Daughter     Age 69  . Diabetes Father   . Heart disease Brother     irregular heart beat    Social History: History   Social History  . Marital Status: Married    Spouse Name: N/A    Number of Children: 2  . Years of Education: N/A   Occupational History  . Dance movement psychotherapist    Social History Main Topics  . Smoking status: Never Smoker   . Smokeless tobacco: Never Used  .  Alcohol Use: No  . Drug Use: No  . Sexual Activity: Yes    Birth Control/ Protection: Surgical, Post-menopausal     Comment: HYST   Other Topics Concern  . Not on file   Social History Narrative   Daily caffeine  2 cups per day    Electrocardiogram:  6/30  SR T wave changes 3,F  ST elevation resolved   Assessment and Plan

## 2014-04-22 ENCOUNTER — Observation Stay (HOSPITAL_COMMUNITY)
Admission: EM | Admit: 2014-04-22 | Discharge: 2014-04-23 | Disposition: A | Discharge status: Home / Self Care | Payer: Medicare Other | Attending: Internal Medicine | Admitting: Internal Medicine

## 2014-04-22 ENCOUNTER — Encounter (HOSPITAL_COMMUNITY)
Admission: EM | Disposition: A | Discharge status: Home / Self Care | Payer: Self-pay | Source: Home / Self Care | Attending: Emergency Medicine

## 2014-04-22 ENCOUNTER — Emergency Department (HOSPITAL_COMMUNITY): Payer: Medicare Other

## 2014-04-22 ENCOUNTER — Encounter (HOSPITAL_COMMUNITY): Payer: Self-pay | Admitting: Emergency Medicine

## 2014-04-22 DIAGNOSIS — I200 Unstable angina: Secondary | ICD-10-CM

## 2014-04-22 DIAGNOSIS — M899 Disorder of bone, unspecified: Secondary | ICD-10-CM | POA: Diagnosis not present

## 2014-04-22 DIAGNOSIS — K219 Gastro-esophageal reflux disease without esophagitis: Secondary | ICD-10-CM | POA: Diagnosis present

## 2014-04-22 DIAGNOSIS — M949 Disorder of cartilage, unspecified: Secondary | ICD-10-CM

## 2014-04-22 DIAGNOSIS — I10 Essential (primary) hypertension: Secondary | ICD-10-CM | POA: Diagnosis not present

## 2014-04-22 DIAGNOSIS — E785 Hyperlipidemia, unspecified: Secondary | ICD-10-CM | POA: Diagnosis not present

## 2014-04-22 DIAGNOSIS — Z860100 Personal history of colon polyps, unspecified: Secondary | ICD-10-CM | POA: Insufficient documentation

## 2014-04-22 DIAGNOSIS — Z9861 Coronary angioplasty status: Secondary | ICD-10-CM | POA: Diagnosis not present

## 2014-04-22 DIAGNOSIS — Z7902 Long term (current) use of antithrombotics/antiplatelets: Secondary | ICD-10-CM | POA: Insufficient documentation

## 2014-04-22 DIAGNOSIS — Z90710 Acquired absence of both cervix and uterus: Secondary | ICD-10-CM | POA: Diagnosis not present

## 2014-04-22 DIAGNOSIS — E079 Disorder of thyroid, unspecified: Secondary | ICD-10-CM | POA: Diagnosis present

## 2014-04-22 DIAGNOSIS — E7800 Pure hypercholesterolemia, unspecified: Secondary | ICD-10-CM | POA: Diagnosis present

## 2014-04-22 DIAGNOSIS — I2510 Atherosclerotic heart disease of native coronary artery without angina pectoris: Secondary | ICD-10-CM

## 2014-04-22 DIAGNOSIS — Z886 Allergy status to analgesic agent status: Secondary | ICD-10-CM

## 2014-04-22 DIAGNOSIS — Z9889 Other specified postprocedural states: Secondary | ICD-10-CM | POA: Insufficient documentation

## 2014-04-22 DIAGNOSIS — Z888 Allergy status to other drugs, medicaments and biological substances status: Secondary | ICD-10-CM

## 2014-04-22 DIAGNOSIS — I252 Old myocardial infarction: Secondary | ICD-10-CM | POA: Diagnosis not present

## 2014-04-22 DIAGNOSIS — R079 Chest pain, unspecified: Principal | ICD-10-CM | POA: Diagnosis present

## 2014-04-22 DIAGNOSIS — K208 Other esophagitis without bleeding: Secondary | ICD-10-CM | POA: Insufficient documentation

## 2014-04-22 DIAGNOSIS — Z85828 Personal history of other malignant neoplasm of skin: Secondary | ICD-10-CM | POA: Diagnosis not present

## 2014-04-22 DIAGNOSIS — K449 Diaphragmatic hernia without obstruction or gangrene: Secondary | ICD-10-CM | POA: Diagnosis not present

## 2014-04-22 DIAGNOSIS — R0789 Other chest pain: Secondary | ICD-10-CM

## 2014-04-22 HISTORY — DX: Unspecified malignant neoplasm of skin, unspecified: C44.90

## 2014-04-22 HISTORY — PX: LEFT HEART CATHETERIZATION WITH CORONARY ANGIOGRAM: SHX5451

## 2014-04-22 LAB — TROPONIN I
Troponin I: <0.3 ng/mL (ref <0.30)
Troponin I: <0.3 ng/mL (ref <0.30)

## 2014-04-22 LAB — BASIC METABOLIC PANEL
ANION GAP: 13 (ref 5–15)
BUN: 9 mg/dL (ref 6–23)
CALCIUM: 9.1 mg/dL (ref 8.4–10.5)
CHLORIDE: 109 meq/L (ref 96–112)
CO2: 21 mEq/L (ref 19–32)
Creatinine, Ser: 0.75 mg/dL (ref 0.50–1.10)
GFR calc Af Amer: >90 mL/min (ref >90)
GFR calc non Af Amer: 81 mL/min — ABNORMAL LOW (ref >90)
Glucose, Bld: 120 mg/dL — ABNORMAL HIGH (ref 70–99)
Potassium: 4 mEq/L (ref 3.7–5.3)
Sodium: 143 mEq/L (ref 137–147)

## 2014-04-22 LAB — CBC
HCT: 37.5 % (ref 36.0–46.0)
Hemoglobin: 12.6 g/dL (ref 12.0–15.0)
MCH: 32.2 pg (ref 26.0–34.0)
MCHC: 33.6 g/dL (ref 30.0–36.0)
MCV: 95.9 fL (ref 78.0–100.0)
PLATELETS: 213 10*3/uL (ref 150–400)
RBC: 3.91 MIL/uL (ref 3.87–5.11)
RDW: 13.4 % (ref 11.5–15.5)
WBC: 4.4 10*3/uL (ref 4.0–10.5)

## 2014-04-22 LAB — APTT: aPTT: 34 seconds (ref 24–37)

## 2014-04-22 LAB — I-STAT TROPONIN, ED: TROPONIN I, POC: 0.01 ng/mL (ref 0.00–0.08)

## 2014-04-22 LAB — HEPARIN LEVEL (UNFRACTIONATED)

## 2014-04-22 LAB — PROTIME-INR
INR: 1.1 (ref 0.00–1.49)
Prothrombin Time: 14.2 seconds (ref 11.6–15.2)

## 2014-04-22 SURGERY — LEFT HEART CATHETERIZATION WITH CORONARY ANGIOGRAM
Anesthesia: LOCAL

## 2014-04-22 MED ORDER — SODIUM CHLORIDE 0.9 % IJ SOLN: 3.0000 mL | Freq: Two times a day (BID) | INTRAMUSCULAR | Status: DC

## 2014-04-22 MED ORDER — ACETAMINOPHEN 325 MG PO TABS: 650.0000 mg | ORAL_TABLET | ORAL | Status: DC | PRN

## 2014-04-22 MED ORDER — ONDANSETRON HCL 4 MG/2ML IJ SOLN: 4.0000 mg | Freq: Four times a day (QID) | INTRAMUSCULAR | Status: DC | PRN

## 2014-04-22 MED ORDER — VERAPAMIL HCL 2.5 MG/ML IV SOLN
INTRAVENOUS | Status: AC
  Filled 2014-04-22: qty 2

## 2014-04-22 MED ORDER — HEPARIN (PORCINE) IN NACL 2-0.9 UNIT/ML-% IJ SOLN
INTRAMUSCULAR | Status: AC
  Filled 2014-04-22: qty 1000

## 2014-04-22 MED ORDER — FENTANYL CITRATE 0.05 MG/ML IJ SOLN
INTRAMUSCULAR | Status: AC
  Filled 2014-04-22: qty 2

## 2014-04-22 MED ORDER — TICAGRELOR 90 MG PO TABS
90.0000 mg | ORAL_TABLET | Freq: Two times a day (BID) | ORAL | Status: DC
  Administered 2014-04-22 (×2): 90 mg via ORAL
  Filled 2014-04-22 (×4): qty 1

## 2014-04-22 MED ORDER — PANTOPRAZOLE SODIUM 40 MG PO TBEC
40.0000 mg | DELAYED_RELEASE_TABLET | Freq: Every day | ORAL | Status: DC
  Administered 2014-04-22: 40 mg via ORAL
  Filled 2014-04-22: qty 1

## 2014-04-22 MED ORDER — NITROGLYCERIN 1 MG/10 ML FOR IR/CATH LAB
INTRA_ARTERIAL | Status: AC
  Filled 2014-04-22: qty 10

## 2014-04-22 MED ORDER — SODIUM CHLORIDE 0.9 % IJ SOLN: 3.0000 mL | INTRAMUSCULAR | Status: DC | PRN

## 2014-04-22 MED ORDER — HEPARIN SODIUM (PORCINE) 1000 UNIT/ML IJ SOLN
INTRAMUSCULAR | Status: AC
  Filled 2014-04-22: qty 1

## 2014-04-22 MED ORDER — SODIUM CHLORIDE 0.9 % IV SOLN
250.0000 mL | INTRAVENOUS | Status: DC | PRN
  Administered 2014-04-22: 250 mL via INTRAVENOUS

## 2014-04-22 MED ORDER — SODIUM CHLORIDE 0.9 % IV SOLN: 250.0000 mL | INTRAVENOUS | Status: DC | PRN

## 2014-04-22 MED ORDER — MIDAZOLAM HCL 2 MG/2ML IJ SOLN
INTRAMUSCULAR | Status: AC
  Filled 2014-04-22: qty 2

## 2014-04-22 MED ORDER — SODIUM CHLORIDE 0.9 % IV SOLN
1.0000 mL/kg/h | INTRAVENOUS | Status: AC
  Administered 2014-04-22: 1 mL/kg/h via INTRAVENOUS

## 2014-04-22 MED ORDER — HEPARIN (PORCINE) IN NACL 100-0.45 UNIT/ML-% IJ SOLN
900.0000 [IU]/h | INTRAMUSCULAR | Status: DC
  Administered 2014-04-22: 700 [IU]/h via INTRAVENOUS
  Filled 2014-04-22: qty 250

## 2014-04-22 MED ORDER — TICAGRELOR 90 MG PO TABS: 90.0000 mg | ORAL_TABLET | Freq: Two times a day (BID) | ORAL | Status: DC

## 2014-04-22 MED ORDER — FENTANYL CITRATE 0.05 MG/ML IJ SOLN: 100.0000 ug | Freq: Once | INTRAMUSCULAR | Status: DC

## 2014-04-22 MED ORDER — NITROGLYCERIN 0.4 MG SL SUBL: 0.4000 mg | SUBLINGUAL_TABLET | SUBLINGUAL | Status: DC | PRN

## 2014-04-22 MED ORDER — ATORVASTATIN CALCIUM 80 MG PO TABS
80.0000 mg | ORAL_TABLET | Freq: Every day | ORAL | Status: DC
  Administered 2014-04-22: 80 mg via ORAL
  Filled 2014-04-22 (×2): qty 1

## 2014-04-22 MED ORDER — LIDOCAINE HCL (PF) 1 % IJ SOLN
INTRAMUSCULAR | Status: AC
  Filled 2014-04-22: qty 30

## 2014-04-22 MED ORDER — SODIUM CHLORIDE 0.9 % IJ SOLN
3.0000 mL | Freq: Two times a day (BID) | INTRAMUSCULAR | Status: DC
  Administered 2014-04-22: 3 mL via INTRAVENOUS

## 2014-04-22 MED ORDER — NITROGLYCERIN 0.4 MG SL SUBL
0.4000 mg | SUBLINGUAL_TABLET | SUBLINGUAL | Status: DC | PRN
  Filled 2014-04-22: qty 1

## 2014-04-22 MED ORDER — METOPROLOL TARTRATE 12.5 MG HALF TABLET
12.5000 mg | ORAL_TABLET | Freq: Two times a day (BID) | ORAL | Status: DC
  Administered 2014-04-22 (×2): 12.5 mg via ORAL
  Filled 2014-04-22 (×4): qty 1

## 2014-04-22 NOTE — H&P (View-Only) (Signed)
Patient Name: Sarah Ray Date of Encounter: 04/22/2014     Active Problems:   Unstable angina    SUBJECTIVE  The patient is not presently having chest discomfort.  The chest discomfort and left arm discomfort she experienced last evening were similar but not as severe as with her recent episode of chest pain in June when she had an inferior STEMI with PCI of the right coronary artery.  Her EKG today shows new inferolateral T-wave changes.  Initial troponin is normal.  CURRENT MEDS . atorvastatin  80 mg Oral q1800  . fentaNYL  100 mcg Intravenous Once  . metoprolol tartrate  12.5 mg Oral BID  . pantoprazole  40 mg Oral Daily  . ticagrelor  90 mg Oral BID    OBJECTIVE  Filed Vitals:   04/22/14 0420 04/22/14 0512 04/22/14 0700 04/22/14 0755  BP: 132/78  121/75 150/75  Pulse:   71 65  Temp: 97.5 F (36.4 C)   98 F (36.7 C)  TempSrc: Oral   Oral  Resp: 18  19 18   Weight:  132 lb 4.4 oz (60 kg)    SpO2: 97%  95% 100%   No intake or output data in the 24 hours ending 04/22/14 0931 Filed Weights   04/22/14 0512  Weight: 132 lb 4.4 oz (60 kg)    PHYSICAL EXAM  General: Pleasant, NAD. Neuro: Alert and oriented X 3. Moves all extremities spontaneously. Psych: Normal affect. HEENT:  Normal  Neck: Supple without bruits or JVD. Lungs:  Resp regular and unlabored, CTA. Heart: RRR no s3, s4, or murmurs. Abdomen: Soft, non-tender, non-distended, BS + x 4.  Extremities: No clubbing, cyanosis or edema. DP/PT/Radials 2+ and equal bilaterally.  Accessory Clinical Findings  CBC  Recent Labs  04/22/14 0430  WBC 4.4  HGB 12.6  HCT 37.5  MCV 95.9  PLT 371   Basic Metabolic Panel  Recent Labs  04/22/14 0430  NA 143  K 4.0  CL 109  CO2 21  GLUCOSE 120*  BUN 9  CREATININE 0.75  CALCIUM 9.1   Liver Function Tests No results found for this basename: AST, ALT, ALKPHOS, BILITOT, PROT, ALBUMIN,  in the last 72 hours No results found for this basename: LIPASE,  AMYLASE,  in the last 72 hours Cardiac Enzymes  Recent Labs  04/22/14 0826  TROPONINI <0.30   BNP No components found with this basename: POCBNP,  D-Dimer No results found for this basename: DDIMER,  in the last 72 hours Hemoglobin A1C No results found for this basename: HGBA1C,  in the last 72 hours Fasting Lipid Panel No results found for this basename: CHOL, HDL, LDLCALC, TRIG, CHOLHDL, LDLDIRECT,  in the last 72 hours Thyroid Function Tests No results found for this basename: TSH, T4TOTAL, FREET3, T3FREE, THYROIDAB,  in the last 72 hours  TELE  Normal sinus rhythm  ECG  Normal sinus rhythm with new inferolateral T-wave inversion since prior tracing of 03/19/14  Radiology/Studies  Dg Chest 2 View  04/22/2014   CLINICAL DATA:  Arm pain.  EXAM: CHEST  2 VIEW  COMPARISON:  None.  FINDINGS: The heart size and mediastinal contours are within normal limits. Mild bronchitic change diffusely. No consolidation, edema, effusion, or pneumothorax. No acute osseous findings.  IMPRESSION: No active cardiopulmonary disease.   Electronically Signed   By: Jorje Guild M.D.   On: 04/22/2014 04:54    ASSESSMENT AND PLAN 1. acute coronary syndrome with unstable angina 2. Hyperlipidemia 3. Hypertension  Plan: Cardiac catheterization today.  Continue IV heparin.  Allergic to aspirin and she is on Brilinta.   Signed, Darlin Coco MD

## 2014-04-22 NOTE — CV Procedure (Signed)
    Cardiac Catheterization Procedure Note  Name: Sarah Ray MRN: [id number] DOB: January 18, 1941  Procedure: Left Heart Cath, Selective Coronary Angiography, LV angiography  Indication: Chest pain concerning for USAP. Previous inferior STEMI 03/18/14   Procedural Details: The right wrist was prepped, draped, and anesthetized with 1% lidocaine. Using the modified Seldinger technique, a 5/6 French Slender sheath was introduced into the right radial artery. 3 mg of verapamil was administered through the sheath, weight-based unfractionated heparin was administered intravenously. Standard Judkins catheters were used for selective coronary angiography and left ventriculography. Catheter exchanges were performed over an exchange length guidewire. There were no immediate procedural complications. A TR band was used for radial hemostasis at the completion of the procedure.  The patient was transferred to the post catheterization recovery area for further monitoring.  Procedural Findings: Hemodynamics: AO 132/70 LV 125/19  Coronary angiography: Coronary dominance: right  Left mainstem: Long segment, minor diffuse irregularity, but no significant disease  Left anterior descending (LAD): Widely patent to the apex with patent diagonals. The proximal LAD has 20-30% stenosis.  Left circumflex (LCx): Patent with 40% proximal stenosis. Supplies a single OM branch.  Right coronary artery (RCA): Large, dominant vessel. Mid-vessel stent is widely patent without restenosis. The distal vessel before the bifurcation has 40-50% stenosis unchanged from the previous study. The PDA and PLA branches are patent.   Left ventriculography: Left ventricular systolic function is normal, LVEF is estimated at 55%, there is no significant mitral regurgitation   Contrast: 60 ml Omnipaque  Fluoro time: 2.2 min  Estimated Blood Loss: minimal  Final Conclusions:   1. Nonobstructive CAD with continued patency of the stented  RCA 2. Normal LV systolic function  Recommendations: anticipate discharge home tomorrow am. Likely noncardiac chest pain.  [name] MD, [facility] 04/22/2014, 3:47 PM

## 2014-04-22 NOTE — ED Provider Notes (Signed)
CSN: [id number]     Arrival date & time 04/22/14  0405 History   First MD Initiated Contact with Patient 04/22/14 0440     Chief Complaint  Patient presents with  . Arm Pain     (Consider location/radiation/quality/duration/timing/severity/associated sxs/prior Treatment) Patient is a 73 y.o. female presenting with arm pain. The history is provided by the patient.  Arm Pain This is a recurrent problem. The current episode started 12 to 24 hours ago. The problem occurs constantly. The problem has been gradually worsening. Associated symptoms include shortness of breath. Pertinent negatives include no chest pain, no abdominal pain and no headaches. Associated symptoms comments: DOE with steps in the past 2 days. Nothing aggravates the symptoms. Relieved by: slight relief with SL NTG. Treatments tried: NTG. The treatment provided mild relief.    Past Medical History  Diagnosis Date  . Hiatal hernia   . Peptic stricture of esophagus   . GERD (gastroesophageal reflux disease)     LA Class Grade C Erosive esophagitis  . Hyperlipidemia   . Internal hemorrhoids   . External hemorrhoids   . Adenomatous polyps 07/1997  . Osteopenia 01/2014     T score -2.0. FRAX 19.8%/4.8% with statistically significant decrease at measured sites  . Breast fibroadenoma   . CAD (coronary artery disease) 03/18/14    STEMI- stent to RCA and nonobstructive disease LAD and LCX  . ST elevation myocardial infarction (STEMI) involving right coronary artery in recovery phase 03/18/14   Past Surgical History  Procedure Laterality Date  . Tonsillectomy      age 34  . Thyroid tumor removed    . Bunionectomy Bilateral 04/04/2013  . Abdominal hysterectomy  1977    Leiomyoma  . Coronary angioplasty with stent placement  03/18/14    Xience DES to totally occl RCA, residual non obstructive disease to LAD and LCX   Family History  Problem Relation Age of Onset  . Colon cancer Maternal Aunt   . Breast cancer Maternal Aunt      Age 66  . Colon polyps Mother   . Breast cancer Daughter     Age 35  . Diabetes Father   . Heart disease Brother     irregular heart beat   History  Substance Use Topics  . Smoking status: Never Smoker   . Smokeless tobacco: Never Used  . Alcohol Use: No   OB History   Grav Para Term Preterm Abortions TAB SAB Ect Mult Living   2 2 2       2      Review of Systems  Respiratory: Positive for shortness of breath.   Cardiovascular: Negative for chest pain, palpitations and leg swelling.  Gastrointestinal: Negative for abdominal pain.  Neurological: Negative for headaches.  All other systems reviewed and are negative.     Allergies  Aspirin  Home Medications   Prior to Admission medications   Medication Sig Start Date End Date Taking? Authorizing Provider  atorvastatin (LIPITOR) 80 MG tablet Take 1 tablet (80 mg total) by mouth daily at 6 PM. 03/20/14  Yes Cecilie Kicks, NP  Calcium Carbonate-Vitamin D (CALCIUM 600 + D PO) Take 1 tablet by mouth 2 (two) times daily.    Yes Historical Provider, MD  metoprolol tartrate (LOPRESSOR) 25 MG tablet Take 0.5 tablets (12.5 mg total) by mouth 2 (two) times daily. 03/20/14  Yes Cecilie Kicks, NP  nitroGLYCERIN (NITROSTAT) 0.4 MG SL tablet Place 1 tablet (0.4 mg total) under the tongue every  5 (five) minutes x 3 doses as needed for chest pain. 03/20/14  Yes Cecilie Kicks, NP  nystatin-triamcinolone ointment [facility]) Apply 1 application topically daily as needed (for rash).   Yes Historical Provider, MD  Omega-3 Fatty Acids (FISH OIL) 1000 MG CAPS Take 1 capsule by mouth 2 (two) times daily.    Yes Historical Provider, MD  pantoprazole (PROTONIX) 40 MG tablet Take 1 tablet (40 mg total) by mouth daily. 03/20/14  Yes Cecilie Kicks, NP  ticagrelor (BRILINTA) 90 MG TABS tablet Take 1 tablet (90 mg total) by mouth 2 (two) times daily. 03/20/14  Yes Cecilie Kicks, NP   BP 132/78  Temp(Src) 97.5 F (36.4 C) (Oral)  Resp 18  Wt 132 lb 4.4 oz (60 kg)   SpO2 97% Physical Exam  Constitutional: She is oriented to person, place, and time. She appears well-developed and well-nourished.  HENT:  Head: Normocephalic and atraumatic.  Mouth/Throat: Oropharynx is clear and moist.  Eyes: Conjunctivae are normal. Pupils are equal, round, and reactive to light.  Neck: Normal range of motion. Neck supple.  Cardiovascular: Normal rate, regular rhythm and intact distal pulses.   Pulmonary/Chest: Effort normal and breath sounds normal. She has no wheezes. She has no rales.  Abdominal: Soft. Bowel sounds are normal. There is no tenderness. There is no rebound and no guarding.  Musculoskeletal: Normal range of motion. She exhibits no edema and no tenderness.  Neurological: She is alert and oriented to person, place, and time.  Skin: Skin is warm and dry. She is not diaphoretic.  Psychiatric: She has a normal mood and affect.    ED Course  Procedures (including critical care time) Labs Review Labs Reviewed  BASIC METABOLIC PANEL - Abnormal; Notable for the following:    Glucose, Bld 120 (*)    GFR calc non Af Amer 81 (*)    All other components within normal limits  CBC  APTT  PROTIME-INR  Randolm Idol, ED    Imaging Review Dg Chest 2 View  04/22/2014   CLINICAL DATA:  Arm pain.  EXAM: CHEST  2 VIEW  COMPARISON:  None.  FINDINGS: The heart size and mediastinal contours are within normal limits. Mild bronchitic change diffusely. No consolidation, edema, effusion, or pneumothorax. No acute osseous findings.  IMPRESSION: No active cardiopulmonary disease.   Electronically Signed   By: Jorje Guild M.D.   On: 04/22/2014 04:54     EKG Interpretation   Date/Time:  Monday April 22 2014 04:10:56 EDT Ventricular Rate:  69 PR Interval:  154 QRS Duration: 94 QT Interval:  400 QTC Calculation: 428 R Axis:   -33 Text Interpretation:  Normal sinus rhythm Left axis deviation Possible  Lateral infarct , age 73 Inferior infarct , age  undetermined  Confirmed by [facility]  MD, Holly Iannaccone ([id number]) on 04/22/2014 5:15:46 AM      MDM   Final diagnoses:  None    . Medications  heparin ADULT infusion 100 units/mL (25000 units/250 mL) (not administered)  fentaNYL (SUBLIMAZE) injection 100 mcg (not administered)  nitroGLYCERIN (NITROSTAT) SL tablet 0.4 mg (not administered)   MDM Reviewed: previous chart, nursing note and vitals Reviewed previous: labs and ECG Interpretation: labs, ECG and x-ray (nacpd) Consults: cardiology  CRITICAL CARE Performed by: Carlisle Beers Total critical care time: 31 MINUTES Critical care time was exclusive of separately billable procedures and treating other patients. Critical care was necessary to treat or prevent imminent or life-threatening deterioration. Critical care was time spent personally by me on  the following activities: development of treatment plan with patient and/or surrogate as well as nursing, discussions with consultants, evaluation of patient's response to treatment, examination of patient, obtaining history from patient or surrogate, ordering and performing treatments and interventions, ordering and review of laboratory studies, ordering and review of radiographic studies, pulse oximetry and re-evaluation of patient's condition.     Carlisle Beers, MD 04/22/14 [phone]

## 2014-04-22 NOTE — Interval H&P Note (Signed)
History and Physical Interval Note:  04/22/2014 3:07 PM  Ok Sarah Ray  has presented today for surgery, with the diagnosis of cp  The various methods of treatment have been discussed with the patient and family. After consideration of risks, benefits and other options for treatment, the patient has consented to  Procedure(s): LEFT HEART CATHETERIZATION WITH CORONARY ANGIOGRAM (N/A) as a surgical intervention .  The patient's history has been reviewed, patient examined, no change in status, stable for surgery.  I have reviewed the patient's chart and labs.  Questions were answered to the patient's satisfaction.    Cath Lab Visit (complete for each Cath Lab visit)  Clinical Evaluation Leading to the Procedure:   ACS: Yes.    Non-ACS:    Anginal Classification: CCS IV  Anti-ischemic medical therapy: Minimal Therapy (1 class of medications)  Non-Invasive Test Results: No non-invasive testing performed  Prior CABG: No previous CABG       Sarah Ray

## 2014-04-22 NOTE — ED Notes (Addendum)
C/o L arm pressure and burning from hand to elbow since Sunday. No known injury.  Denies nausea, vomiting, sob, or chest pain but concerned because she had stent placement in June.  States she does feel anxious.

## 2014-04-22 NOTE — ED Notes (Addendum)
Pt reports tingling to her left arm that started from the hand to elbow. Pt states she took nitro after calling [facility]ologist and pain seems to have gotten better but since then pain is back. Pt rates pain 2/10. Pt denies any sob, nausea, vomiting or diaphoresis. Pt had stents placed a month ago. Pt also denies any cp. Pt is anxious due to having her heart attack a month ago.

## 2014-04-22 NOTE — Progress Notes (Signed)
ANTICOAGULATION CONSULT NOTE - Initial Consult  Pharmacy Consult for heparin gtt Indication: chest pain/ACS  Allergies  Allergen Reactions  . Aspirin     REACTION: hives and diarrhea    Patient Measurements: Weight: 132 lb 4.4 oz (60 kg) (estimate ) Heparin Dosing Weight: 60 kg  Vital Signs: Temp: 98 F (36.7 C) (08/03 0755) Temp src: Oral (08/03 0755) BP: 142/74 mmHg (08/03 1017) Pulse Rate: 61 (08/03 1017)  Labs:  Recent Labs  04/22/14 0430 04/22/14 0531 04/22/14 0826 04/22/14 1225  HGB 12.6  --   --   --   HCT 37.5  --   --   --   PLT 213  --   --   --   APTT  --  34  --   --   LABPROT  --  14.2  --   --   INR  --  1.10  --   --   HEPARINUNFRC  --   --   --  <0.10*  CREATININE 0.75  --   --   --   TROPONINI  --   --  <0.30  --     The CrCl is unknown because both a height and weight (above a minimum accepted value) are required for this calculation.   Medical History: Past Medical History  Diagnosis Date  . Hiatal hernia   . Peptic stricture of esophagus   . GERD (gastroesophageal reflux disease)     LA Class Grade C Erosive esophagitis  . Hyperlipidemia   . Internal hemorrhoids   . External hemorrhoids   . Adenomatous polyps 07/1997  . Osteopenia 01/2014     T score -2.0. FRAX 19.8%/4.8% with statistically significant decrease at measured sites  . Breast fibroadenoma   . CAD (coronary artery disease) 03/18/14    STEMI- stent to RCA and nonobstructive disease LAD and LCX  . 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 Pneumonia 1940's  . Skin cancer 2014    "forehead"    Medications:  Prescriptions prior to admission  Medication Sig Dispense Refill  . atorvastatin (LIPITOR) 80 MG tablet Take 1 tablet (80 mg total) by mouth daily at 6 PM.  30 tablet  6  . Calcium Carbonate-Vitamin D (CALCIUM 600 + D PO) Take 1 tablet by mouth 2 (two) times daily.       . metoprolol tartrate (LOPRESSOR)  25 MG tablet Take 0.5 tablets (12.5 mg total) by mouth 2 (two) times daily.  30 tablet  6  . nitroGLYCERIN (NITROSTAT) 0.4 MG SL tablet Place 1 tablet (0.4 mg total) under the tongue every 5 (five) minutes x 3 doses as needed for chest pain.  25 tablet  4  . nystatin-triamcinolone ointment (MYCOLOG) Apply 1 application topically daily as needed (for rash).      . Omega-3 Fatty Acids (FISH OIL) 1000 MG CAPS Take 1 capsule by mouth 2 (two) times daily.       . pantoprazole (PROTONIX) 40 MG tablet Take 1 tablet (40 mg total) by mouth daily.  30 tablet  6  . ticagrelor (BRILINTA) 90 MG TABS tablet Take 1 tablet (90 mg total) by mouth 2 (two) times daily.  60 tablet  11    Assessment: 73 yo female on heaprin gtt for ACS while awaiting cardiac cath.  Pt had recent STEMI in June 2015 requiring PCI of RCA.  Initial heparin level returned undetectable.  Will increase rate.  CBC  stable, no signs of bleeding.   Goal of Therapy:  Heparin level 0.3-0.7 units/ml Monitor platelets by anticoagulation protocol: Yes    Plan:  Increase heparin to 900 units/hr Daily HL, CBC Monitor for s/sx bleeding F/u after cath    Hughes Better, PharmD, BCPS Clinical Pharmacist Pager: [phone] 04/22/2014 2:17 PM

## 2014-04-22 NOTE — H&P (Signed)
Cardiology H&P Note  Patient ID: Sarah Ray, MRN: [id number], DOB/AGE: October 20, 1940 73 y.o. Admit date: 04/22/2014   Date of Consult: 04/22/2014 Primary Physician: Gilford Rile, MD Primary Cardiologist: Jenkins Rouge   Chief Complaint: left arm pain    Assessment and Plan:  Ass -Unstable Angina  (Left arm pain with some features similar to her angina in this pt with  recent PCI ) -HLD  -HTN   PLan  Possible angina with inferolateral TWI. Although troponin are negative she does have risk features of recent PCI. Will therefore hepanaize, continue Brilinata and atorvastatin admitt to tele  Cycle cardiac enzymes   HPI: 73 yr old female with hx of CAD s/p infereior STEMI with PCI of RCA in  02/2014 here with chest pain .  Pt states that at the time of her MI she had burning central chest discomfort radiating to her neck along with arm pain and tingling. She had been doing well post PCi and d/c and involved in her daily chores . Yesterday she started experiencing her left arm pain and tingling along with mild SOB after walking up two flight of stairs. She took one SL NTG with some improvement in arm pain  Has had angioedema with aspirin and takes brilinta  No orthopnea, PND , LE edema , DOE ,  focal weakness, syncope, bleeding diathesis , claudication , palpitation etc .  Reports medication compliance  Past Medical History  Diagnosis Date  . Hiatal hernia   . Peptic stricture of esophagus   . GERD (gastroesophageal reflux disease)     LA Class Grade C Erosive esophagitis  . Hyperlipidemia   . Internal hemorrhoids   . External hemorrhoids   . Adenomatous polyps 07/1997  . Osteopenia 01/2014     T score -2.0. FRAX 19.8%/4.8% with statistically significant decrease at measured sites  . Breast fibroadenoma   . CAD (coronary artery disease) 03/18/14    STEMI- stent to RCA and nonobstructive disease LAD and LCX  . ST elevation myocardial infarction (STEMI) involving right coronary artery in  recovery phase 03/18/14      Most Recent Cardiac Studies: Final Conclusions: LHC 03/18/2014 by Dr cooper  1. Acute inferolateral STEMI secondary to RCA occlusion, treated successfully with primary PCI using a drug-eluting stent  2. Mild nonobstructive LAD and left circumflex stenoses  3. Preserved LV systolic function with an estimated LVEF of 65%.     Surgical History:  Past Surgical History  Procedure Laterality Date  . Tonsillectomy      age 58  . Thyroid tumor removed    . Bunionectomy Bilateral 04/04/2013  . Abdominal hysterectomy  1977    Leiomyoma  . Coronary angioplasty with stent placement  03/18/14    Xience DES to totally occl RCA, residual non obstructive disease to LAD and LCX     Home Meds: Prior to Admission medications   Medication Sig Start Date End Date Taking? Authorizing Provider  atorvastatin (LIPITOR) 80 MG tablet Take 1 tablet (80 mg total) by mouth daily at 6 PM. 03/20/14  Yes Cecilie Kicks, NP  Calcium Carbonate-Vitamin D (CALCIUM 600 + D PO) Take 1 tablet by mouth 2 (two) times daily.    Yes Historical Provider, MD  metoprolol tartrate (LOPRESSOR) 25 MG tablet Take 0.5 tablets (12.5 mg total) by mouth 2 (two) times daily. 03/20/14  Yes Cecilie Kicks, NP  nitroGLYCERIN (NITROSTAT) 0.4 MG SL tablet Place 1 tablet (0.4 mg total) under the tongue every 5 (five) minutes x  3 doses as needed for chest pain. 03/20/14  Yes Cecilie Kicks, NP  nystatin-triamcinolone ointment [facility]) Apply 1 application topically daily as needed (for rash).   Yes Historical Provider, MD  Omega-3 Fatty Acids (FISH OIL) 1000 MG CAPS Take 1 capsule by mouth 2 (two) times daily.    Yes Historical Provider, MD  pantoprazole (PROTONIX) 40 MG tablet Take 1 tablet (40 mg total) by mouth daily. 03/20/14  Yes Cecilie Kicks, NP  ticagrelor (BRILINTA) 90 MG TABS tablet Take 1 tablet (90 mg total) by mouth 2 (two) times daily. 03/20/14  Yes Cecilie Kicks, NP    Inpatient Medications:  . fentaNYL  100 mcg  Intravenous Once   . heparin      Allergies:  Allergies  Allergen Reactions  . Aspirin     REACTION: hives and diarrhea    History   Social History  . Marital Status: Married    Spouse Name: N/A    Number of Children: 2  . Years of Education: N/A   Occupational History  . Dance movement psychotherapist    Social History Main Topics  . Smoking status: Never Smoker   . Smokeless tobacco: Never Used  . Alcohol Use: No  . Drug Use: No  . Sexual Activity: Yes    Birth Control/ Protection: Surgical, Post-menopausal     Comment: HYST   Other Topics Concern  . Not on file   Social History Narrative   Daily caffeine  2 cups per day     Family History  Problem Relation Age of Onset  . Colon cancer Maternal Aunt   . Breast cancer Maternal Aunt     Age 76  . Colon polyps Mother   . Breast cancer Daughter     Age 86  . Diabetes Father   . Heart disease Brother     irregular heart beat     Review of Systems: General: negative for chills, fever, night sweats or weight changes.  Cardiovascular:per HPI  Dermatological: negative for rash Respiratory: negative for cough or wheezing Urologic: negative for hematuria Abdominal: negative for nausea, vomiting, diarrhea, bright red blood per rectum, melena, or hematemesis Neurologic: negative for visual changes, syncope, or dizziness All other systems reviewed and are otherwise negative except as noted above.  Labs: No results found for this basename: CKTOTAL, CKMB, TROPONINI,  in the last 72 hours Lab Results  Component Value Date   WBC 4.4 04/22/2014   HGB 12.6 04/22/2014   HCT 37.5 04/22/2014   MCV 95.9 04/22/2014   PLT 213 04/22/2014    Recent Labs Lab 04/22/14 0430  NA 143  K 4.0  CL 109  CO2 21  BUN 9  CREATININE 0.75  CALCIUM 9.1  GLUCOSE 120*   Lab Results  Component Value Date   CHOL 185 03/19/2014   HDL 40 03/19/2014   LDLCALC 114* 03/19/2014   TRIG 157* 03/19/2014   No results found for this basename: DDIMER    Trop I negative  Radiology/Studies:  Dg Chest 2 View  04/22/2014   CLINICAL DATA:  Arm pain.  EXAM: CHEST  2 VIEW  COMPARISON:  None.  FINDINGS: The heart size and mediastinal contours are within normal limits. Mild bronchitic change diffusely. No consolidation, edema, effusion, or pneumothorax. No acute osseous findings.  IMPRESSION: No active cardiopulmonary disease.   Electronically Signed   By: Jorje Guild M.D.   On: 04/22/2014 04:54    EKG: NSR with inferolateral TWI   Physical Exam: Blood  pressure 132/78, temperature 97.5 F (36.4 C), temperature source Oral, resp. rate 18, weight 60 kg (132 lb 4.4 oz), SpO2 97.00%. General: Well developed, well nourished, in no acute distress.  Neck: Negative for carotid bruits. JVD not elevated. Lungs: Clear bilaterally to auscultation without wheezes, rales, or rhonchi. Breathing is unlabored. Heart: RRR with S1 S2. No murmurs, rubs, or gallops appreciated. Abdomen: Soft, non-tender, non-distended with normoactive bowel sounds. No hepatomegaly. No rebound/guarding. No obvious abdominal masses. Extremities: No clubbing or cyanosis. No edema.  Distal pedal pulses are 2+ and equal bilaterally. Neuro: Alert and oriented X 3. No facial asymmetry. No focal deficit. Moves all extremities spontaneously. Psych:  Responds to questions appropriately with a normal affect.       Cory Roughen, A M.D  04/22/2014, 6:20 AM

## 2014-04-22 NOTE — Progress Notes (Signed)
Patient Name: Sarah Ray Date of Encounter: 04/22/2014     Active Problems:   Unstable angina    SUBJECTIVE  The patient is not presently having chest discomfort.  The chest discomfort and left arm discomfort she experienced last evening were similar but not as severe as with her recent episode of chest pain in June when she had an inferior STEMI with PCI of the right coronary artery.  Her EKG today shows new inferolateral T-wave changes.  Initial troponin is normal.  CURRENT MEDS . atorvastatin  80 mg Oral q1800  . fentaNYL  100 mcg Intravenous Once  . metoprolol tartrate  12.5 mg Oral BID  . pantoprazole  40 mg Oral Daily  . ticagrelor  90 mg Oral BID    OBJECTIVE  Filed Vitals:   04/22/14 0420 04/22/14 0512 04/22/14 0700 04/22/14 0755  BP: 132/78  121/75 150/75  Pulse:   71 65  Temp: 97.5 F (36.4 C)   98 F (36.7 C)  TempSrc: Oral   Oral  Resp: 18  19 18   Weight:  132 lb 4.4 oz (60 kg)    SpO2: 97%  95% 100%   No intake or output data in the 24 hours ending 04/22/14 0931 Filed Weights   04/22/14 0512  Weight: 132 lb 4.4 oz (60 kg)    PHYSICAL EXAM  General: Pleasant, NAD. Neuro: Alert and oriented X 3. Moves all extremities spontaneously. Psych: Normal affect. HEENT:  Normal  Neck: Supple without bruits or JVD. Lungs:  Resp regular and unlabored, CTA. Heart: RRR no s3, s4, or murmurs. Abdomen: Soft, non-tender, non-distended, BS + x 4.  Extremities: No clubbing, cyanosis or edema. DP/PT/Radials 2+ and equal bilaterally.  Accessory Clinical Findings  CBC  Recent Labs  04/22/14 0430  WBC 4.4  HGB 12.6  HCT 37.5  MCV 95.9  PLT 629   Basic Metabolic Panel  Recent Labs  04/22/14 0430  NA 143  K 4.0  CL 109  CO2 21  GLUCOSE 120*  BUN 9  CREATININE 0.75  CALCIUM 9.1   Liver Function Tests No results found for this basename: AST, ALT, ALKPHOS, BILITOT, PROT, ALBUMIN,  in the last 72 hours No results found for this basename: LIPASE,  AMYLASE,  in the last 72 hours Cardiac Enzymes  Recent Labs  04/22/14 0826  TROPONINI <0.30   BNP No components found with this basename: POCBNP,  D-Dimer No results found for this basename: DDIMER,  in the last 72 hours Hemoglobin A1C No results found for this basename: HGBA1C,  in the last 72 hours Fasting Lipid Panel No results found for this basename: CHOL, HDL, LDLCALC, TRIG, CHOLHDL, LDLDIRECT,  in the last 72 hours Thyroid Function Tests No results found for this basename: TSH, T4TOTAL, FREET3, T3FREE, THYROIDAB,  in the last 72 hours  TELE  Normal sinus rhythm  ECG  Normal sinus rhythm with new inferolateral T-wave inversion since prior tracing of 03/19/14  Radiology/Studies  Dg Chest 2 View  04/22/2014   CLINICAL DATA:  Arm pain.  EXAM: CHEST  2 VIEW  COMPARISON:  None.  FINDINGS: The heart size and mediastinal contours are within normal limits. Mild bronchitic change diffusely. No consolidation, edema, effusion, or pneumothorax. No acute osseous findings.  IMPRESSION: No active cardiopulmonary disease.   Electronically Signed   By: Jorje Guild M.D.   On: 04/22/2014 04:54    ASSESSMENT AND PLAN 1. acute coronary syndrome with unstable angina 2. Hyperlipidemia 3. Hypertension  Plan: Cardiac catheterization today.  Continue IV heparin.  Allergic to aspirin and she is on Brilinta.   Signed, Darlin Coco MD

## 2014-04-23 DIAGNOSIS — R0789 Other chest pain: Secondary | ICD-10-CM

## 2014-04-23 LAB — BASIC METABOLIC PANEL
Anion gap: 11 (ref 5–15)
BUN: 7 mg/dL (ref 6–23)
CHLORIDE: 109 meq/L (ref 96–112)
CO2: 24 meq/L (ref 19–32)
Calcium: 8.8 mg/dL (ref 8.4–10.5)
Creatinine, Ser: 0.79 mg/dL (ref 0.50–1.10)
GFR calc Af Amer: >90 mL/min (ref >90)
GFR calc non Af Amer: 79 mL/min — ABNORMAL LOW (ref >90)
GLUCOSE: 97 mg/dL (ref 70–99)
POTASSIUM: 3.9 meq/L (ref 3.7–5.3)
SODIUM: 144 meq/L (ref 137–147)

## 2014-04-23 NOTE — Progress Notes (Signed)
Patient Name: Sarah Ray Date of Encounter: 04/23/2014     Active Problems:   Unstable angina    SUBJECTIVE Patient feels well. No chest pain. Rhythm stable.  CURRENT MEDS . atorvastatin  80 mg Oral q1800  . fentaNYL  100 mcg Intravenous Once  . metoprolol tartrate  12.5 mg Oral BID  . pantoprazole  40 mg Oral Daily  . sodium chloride  3 mL Intravenous Q12H  . ticagrelor  90 mg Oral BID    OBJECTIVE  Filed Vitals:   04/22/14 1901 04/22/14 2003 04/22/14 2117 04/23/14 0430  BP: 102/58 105/49 100/62 120/69  Pulse: 65 68 62 69  Temp:    98 F (36.7 C)  TempSrc:    Oral  Resp:    18  Weight:    134 lb 14.4 oz (61.19 kg)  SpO2: 97% 96% 97% 97%    Intake/Output Summary (Last 24 hours) at 04/23/14 0738 Last data filed at 04/22/14 1300  Gross per 24 hour  Intake      0 ml  Output    700 ml  Net   -700 ml   Filed Weights   04/22/14 0512 04/23/14 0430  Weight: 132 lb 4.4 oz (60 kg) 134 lb 14.4 oz (61.19 kg)    PHYSICAL EXAM  General: Pleasant, NAD. Neuro: Alert and oriented X 3. Moves all extremities spontaneously. Psych: Normal affect. HEENT:  Normal  Neck: Supple without bruits or JVD. Lungs:  Resp regular and unlabored, CTA. Heart: RRR no s3, s4, or murmurs. Abdomen: Soft, non-tender, non-distended, BS + x 4.  Extremities: No clubbing, cyanosis or edema. DP/PT/Radials 2+ and equal bilaterally. Right radial pulse good.  Accessory Clinical Findings  CBC  Recent Labs  04/22/14 0430  WBC 4.4  HGB 12.6  HCT 37.5  MCV 95.9  PLT 568   Basic Metabolic Panel  Recent Labs  04/22/14 0430  NA 143  K 4.0  CL 109  CO2 21  GLUCOSE 120*  BUN 9  CREATININE 0.75  CALCIUM 9.1   Liver Function Tests No results found for this basename: AST, ALT, ALKPHOS, BILITOT, PROT, ALBUMIN,  in the last 72 hours No results found for this basename: LIPASE, AMYLASE,  in the last 72 hours Cardiac Enzymes  Recent Labs  04/22/14 0826 04/22/14 1342 04/22/14 2012    TROPONINI <0.30 <0.30 <0.30   BNP No components found with this basename: POCBNP,  D-Dimer No results found for this basename: DDIMER,  in the last 72 hours Hemoglobin A1C No results found for this basename: HGBA1C,  in the last 72 hours Fasting Lipid Panel No results found for this basename: CHOL, HDL, LDLCALC, TRIG, CHOLHDL, LDLDIRECT,  in the last 72 hours Thyroid Function Tests No results found for this basename: TSH, T4TOTAL, FREET3, T3FREE, THYROIDAB,  in the last 72 hours  TELE  NSR  ECG    Radiology/Studies  Dg Chest 2 View  04/22/2014   CLINICAL DATA:  Arm pain.  EXAM: CHEST  2 VIEW  COMPARISON:  None.  FINDINGS: The heart size and mediastinal contours are within normal limits. Mild bronchitic change diffusely. No consolidation, edema, effusion, or pneumothorax. No acute osseous findings.  IMPRESSION: No active cardiopulmonary disease.   Electronically Signed   By: Jorje Guild M.D.   On: 04/22/2014 04:54    ASSESSMENT AND PLAN 1. Nonobstructive CAD with continued patency of the stented RCA  2. Normal LV systolic function  Plan: Okay for discharge today. Resume outpatient  cardiac rehab program. Followup with Dr. Johnsie Cancel in 2 month.   Signed, Darlin Coco MD

## 2014-04-23 NOTE — Discharge Summary (Signed)
Discharge Summary   Patient ID: Sarah Ray,  MRN: [id number], DOB/AGE: 12/02/1940 73 y.o.  Admit date: 04/22/2014 Discharge date: 04/23/2014  Primary Care Provider: Gilford Rile Primary Cardiologist: Dr. Johnsie Cancel  Discharge Diagnoses Principal Problem:   Chest pain Active Problems:   Unspecified disorder of thyroid   HYPERCHOLESTEROLEMIA   GERD   History of allergy to aspirin- Hives, edema   CAD (coronary artery disease)   Allergies Allergies  Allergen Reactions  . Aspirin     REACTION: hives and diarrhea    Procedures  Procedure: Left Heart Cath, Selective Coronary Angiography, LV angiography  Procedural Findings:  Hemodynamics:  AO 132/70  LV 125/19  Coronary angiography:  Coronary dominance: right  Left mainstem: Long segment, minor diffuse irregularity, but no significant disease  Left anterior descending (LAD): Widely patent to the apex with patent diagonals. The proximal LAD has 20-30% stenosis.  Left circumflex (LCx): Patent with 40% proximal stenosis. Supplies a single OM branch.  Right coronary artery (RCA): Large, dominant vessel. Mid-vessel stent is widely patent without restenosis. The distal vessel before the bifurcation has 40-50% stenosis unchanged from the previous study. The PDA and PLA branches are patent.  Left ventriculography: Left ventricular systolic function is normal, LVEF is estimated at 55%, there is no significant mitral regurgitation  Contrast: 60 ml Omnipaque  Fluoro time: 2.2 min  Estimated Blood Loss: minimal  Final Conclusions:  1. Nonobstructive CAD with continued patency of the stented RCA  2. Normal LV systolic function  Recommendations: anticipate discharge home tomorrow am. Likely noncardiac chest pain.    Hospital Course  The patient is a 73 year old female with past medical history of hypertension, hyperlipidemia and coronary artery disease who recently underwent PCI of RCA in June 2015 after suffering inferior STEMI. The  patient has been doing well after her recent discharge, however she presented to [facility] ED on 04/22/2014 after experiencing left arm pain and tingling along with shortness of breath with exertion the day before. The discomfort was relieved with one sublingual nitroglycerin with some improvement L. arm pain. Given her risk factors and her recent PCI, the decision was made for the patient to undergo cardiac catheterization. She was brought to the cath lab on the same day. Cardiac catheterization showed nonobstructive CAD with LVEF 55%, 20-30% proximal LAD stenosis, 40% proximal left circumflex stenosis, 40-50% distal RCA stenosis, mid RCA stent was widely patent. Her chest pain was deemed most likely noncardiac in origin. Post-cath, she was kept overnight for observation.  She was seen the morning of 04/23/2014, at which time, she denied any significant chest discomfort or shortness of breath. She is deemed stable for discharge from cardiology perspective. She will followup with Dr. Johnsie Cancel in the clinic in about 2 months. Of note, patient wish to restart outpatient cardiac rehabilitation. We will ask cardiac rehabilitation nurse to see her prior to discharge.   Discharge Vitals Blood pressure 120/69, pulse 69, temperature 98 F (36.7 C), temperature source Oral, resp. rate 18, weight 134 lb 14.4 oz (61.19 kg), SpO2 97.00%.  Filed Weights   04/22/14 0512 04/23/14 0430  Weight: 132 lb 4.4 oz (60 kg) 134 lb 14.4 oz (61.19 kg)    Labs  CBC  Recent Labs  04/22/14 0430  WBC 4.4  HGB 12.6  HCT 37.5  MCV 95.9  PLT 024   Basic Metabolic Panel  Recent Labs  04/22/14 0430 04/23/14 0650  NA 143 144  K 4.0 3.9  CL 109 109  CO2 21 24  GLUCOSE 120* 97  BUN 9 7  CREATININE 0.75 0.79  CALCIUM 9.1 8.8   Cardiac Enzymes  Recent Labs  04/22/14 0826 04/22/14 1342 04/22/14 2012  TROPONINI <0.30 <0.30 <0.30    Disposition  Pt is being discharged home today in good condition.  Follow-up  Plans & Appointments      Follow-up Information   Follow up with Jenkins Rouge, MD On 07/17/2014. (10:00am)    Specialty:  Cardiology   Contact information:   [address] [place] [address] [phone]       Discharge Medications    Medication List         atorvastatin 80 MG tablet  Commonly known as:  LIPITOR  Take 1 tablet (80 mg total) by mouth daily at 6 PM.     CALCIUM 600 + D PO  Take 1 tablet by mouth 2 (two) times daily.     Fish Oil 1000 MG Caps  Take 1 capsule by mouth 2 (two) times daily.     metoprolol tartrate 25 MG tablet  Commonly known as:  LOPRESSOR  Take 0.5 tablets (12.5 mg total) by mouth 2 (two) times daily.     nitroGLYCERIN 0.4 MG SL tablet  Commonly known as:  NITROSTAT  Place 1 tablet (0.4 mg total) under the tongue every 5 (five) minutes x 3 doses as needed for chest pain.     nystatin-triamcinolone ointment  Commonly known as:  MYCOLOG  Apply 1 application topically daily as needed (for rash).     pantoprazole 40 MG tablet  Commonly known as:  PROTONIX  Take 1 tablet (40 mg total) by mouth daily.     ticagrelor 90 MG Tabs tablet  Commonly known as:  BRILINTA  Take 1 tablet (90 mg total) by mouth 2 (two) times daily.        Duration of Discharge Encounter   Greater than 30 minutes including physician time.  Signed, Almyra Deforest PA-C 04/23/2014, 8:26 AM

## 2014-04-23 NOTE — Progress Notes (Signed)
Cardiac rehab saw pt this am. Assessment unchanged from this am. D/c'd via wheelchair to private vehicle

## 2014-04-23 NOTE — Discharge Instructions (Signed)
Chest Pain (Nonspecific) It is often hard to give a diagnosis for the cause of chest pain. There is always a chance that your pain could be related to something serious, such as a heart attack or a blood clot in the lungs. You need to follow up with your doctor. HOME CARE  If antibiotic medicine was given, take it as directed by your doctor. Finish the medicine even if you start to feel better.  For the next few days, avoid activities that bring on chest pain. Continue physical activities as told by your doctor.  Do not use any tobacco products. This includes cigarettes, chewing tobacco, and e-cigarettes.  Avoid drinking alcohol.  Only take medicine as told by your doctor.  Follow your doctor's suggestions for more testing if your chest pain does not go away.  Keep all doctor visits you made. GET HELP IF:  Your chest pain does not go away, even after treatment.  You have a rash with blisters on your chest.  You have a fever. GET HELP RIGHT AWAY IF:   You have more pain or pain that spreads to your arm, neck, jaw, back, or belly (abdomen).  You have shortness of breath.  You cough more than usual or cough up blood.  You have very bad back or belly pain.  You feel sick to your stomach (nauseous) or throw up (vomit).  You have very bad weakness.  You pass out (faint).  You have chills. This is an emergency. Do not wait to see if the problems will go away. Call your local emergency services (911 in [place] Do not drive yourself to the hospital. MAKE SURE YOU:   Understand these instructions.  Will watch your condition.  Will get help right away if you are not doing well or get worse. Document Released: 02/23/2008 Document Revised: 09/11/2013 Document Reviewed: 02/23/2008 [facility] Patient Information 2015 [place], [place]. This information is not intended to replace advice given to you by your health care provider. Make sure you discuss any questions you have with your  health care provider.  No driving for 48 hours. No lifting over 5 lbs for 1 week. No sexual activity for 1 week. Keep procedure site clean & dry. If you notice increased pain, swelling, bleeding or pus, call/return!  You may shower, but no soaking baths/hot tubs/pools for 1 week.

## 2014-04-24 ENCOUNTER — Telehealth: Payer: Self-pay | Admitting: Cardiovascular Disease

## 2014-04-24 NOTE — Telephone Encounter (Signed)
SPOKE WITH   PT,  WAS REALLY BUSY  ON Sunday  AND   Sunday  NIGHT   NOTED SENSATION  TO  LEFT  ARM   WITH  NUMBNESS TO  HAND  NOTHING PAST  ELBOW. B/P  WAS ELEVATED CALLED ON CALL  MD  AND  WAS  TOLD TO TRY NTG  AND  IF NO BETTER  TO GO TO ER   FOR EVAL  AND TX. PER  PT   NTG WAS TAKEN  AND  SYMPTOMS   DID NOT   TOTALLY  LEAVE  AND  PROCEEDED TO  ER  WAS  CATHED ON Monday  BY  DR COOPER       ALL TESTS WERE  NEGATIVE STATES  FEELS  BETTER AT THIS TIME   THINKS  MAYBE   SOME  ANXIETY  WILL FORWARD TO  DR Johnsie Cancel  FOR REVIEW./CY

## 2014-04-24 NOTE — Telephone Encounter (Signed)
Patient is not feeling well and would like you to give her a call. Please call and advise.

## 2014-04-29 NOTE — Telephone Encounter (Signed)
PT  NOTIFIED ./CY 

## 2014-04-29 NOTE — Telephone Encounter (Signed)
PT  CALLING AGAIN HAD  EPISODE LAST  NIGHT  ACCORDING TO  MESSAGE  WILL FORWARD TO DR Johnsie Cancel  ./CY HEAVINESS  IN  ARMS AND  LEGS   RIGHT NOW NOT  SURE  IF MEDS  CAUSING  SYMPTOMS. ACTIVITY  LEVEL DOES  NOT CHANGE   SYMPTOMS  .Adonis Housekeeper

## 2014-04-29 NOTE — Telephone Encounter (Signed)
Symptoms not from heart just had cath and stent patent  F/u ;pulmonary and primary

## 2014-04-29 NOTE — Telephone Encounter (Signed)
Follow up     Pt had stent on 03-18-14.  Last Sunday night she had tingling and heaviness--she had another cath.  It was clear.  She had tingling and heaviness last night.  She went to cardiac rehab this am and had no problems.  However, she is still having a little tingling and numbness in both arms but no chest pain.  Please adivse.

## 2014-05-10 ENCOUNTER — Telehealth: Payer: Self-pay | Admitting: Cardiovascular Disease

## 2014-05-10 ENCOUNTER — Other Ambulatory Visit: Payer: Self-pay | Admitting: *Deleted

## 2014-05-10 MED ORDER — CLOPIDOGREL BISULFATE 75 MG PO TABS
ORAL_TABLET | ORAL | Status: DC
Start: 2014-05-10 — End: 2014-05-30

## 2014-05-10 NOTE — Telephone Encounter (Signed)
PER   PT  CONT. TO C/O TINGLING   ALSO NOTES  BURNING   TO SCALP  AS  WELL  PT  AWARE  WILL DISCUSS  WITH  DR COOPER   PER  DR  COOPER  STOP  BRILINTA  AND  START   GEN PLAVIX  300 MG    TODAY THE   75 MG  QD PT  AWARE   WILL DISCUSS  IN FURTHER  DETAIL  IS ON WAY  TO OFFICE  WITH HUSBAND   TO PICK  UP  SAMPLES  FOR  HIM  .Sarah Ray

## 2014-05-10 NOTE — Telephone Encounter (Signed)
New message     Pt thinks she is having a reaction to her medications.  She is having finger/elbow tingling and skin sensitive to the touch.  No chest pain/pressure or headache or dizziness.  Could this be coming from her brilinta?

## 2014-05-12 ENCOUNTER — Emergency Department (HOSPITAL_COMMUNITY): Payer: Medicare Other

## 2014-05-12 ENCOUNTER — Emergency Department (HOSPITAL_COMMUNITY)
Admission: EM | Admit: 2014-05-12 | Discharge: 2014-05-12 | Disposition: A | Discharge status: Home / Self Care | Payer: Medicare Other | Attending: Emergency Medicine | Admitting: Emergency Medicine

## 2014-05-12 ENCOUNTER — Encounter (HOSPITAL_COMMUNITY): Payer: Self-pay | Admitting: Emergency Medicine

## 2014-05-12 DIAGNOSIS — M949 Disorder of cartilage, unspecified: Secondary | ICD-10-CM

## 2014-05-12 DIAGNOSIS — Z9861 Coronary angioplasty status: Secondary | ICD-10-CM | POA: Insufficient documentation

## 2014-05-12 DIAGNOSIS — M899 Disorder of bone, unspecified: Secondary | ICD-10-CM | POA: Diagnosis not present

## 2014-05-12 DIAGNOSIS — K219 Gastro-esophageal reflux disease without esophagitis: Secondary | ICD-10-CM | POA: Insufficient documentation

## 2014-05-12 DIAGNOSIS — Z85828 Personal history of other malignant neoplasm of skin: Secondary | ICD-10-CM | POA: Diagnosis not present

## 2014-05-12 DIAGNOSIS — I252 Old myocardial infarction: Secondary | ICD-10-CM | POA: Diagnosis not present

## 2014-05-12 DIAGNOSIS — E785 Hyperlipidemia, unspecified: Secondary | ICD-10-CM | POA: Insufficient documentation

## 2014-05-12 DIAGNOSIS — Z79899 Other long term (current) drug therapy: Secondary | ICD-10-CM | POA: Diagnosis not present

## 2014-05-12 DIAGNOSIS — R209 Unspecified disturbances of skin sensation: Secondary | ICD-10-CM | POA: Insufficient documentation

## 2014-05-12 DIAGNOSIS — IMO0001 Reserved for inherently not codable concepts without codable children: Secondary | ICD-10-CM | POA: Insufficient documentation

## 2014-05-12 DIAGNOSIS — Z860100 Personal history of colon polyps, unspecified: Secondary | ICD-10-CM | POA: Insufficient documentation

## 2014-05-12 DIAGNOSIS — R079 Chest pain, unspecified: Secondary | ICD-10-CM | POA: Diagnosis present

## 2014-05-12 DIAGNOSIS — R202 Paresthesia of skin: Secondary | ICD-10-CM

## 2014-05-12 DIAGNOSIS — Z8701 Personal history of pneumonia (recurrent): Secondary | ICD-10-CM | POA: Diagnosis not present

## 2014-05-12 DIAGNOSIS — I2510 Atherosclerotic heart disease of native coronary artery without angina pectoris: Secondary | ICD-10-CM | POA: Diagnosis not present

## 2014-05-12 DIAGNOSIS — Z7902 Long term (current) use of antithrombotics/antiplatelets: Secondary | ICD-10-CM | POA: Insufficient documentation

## 2014-05-12 DIAGNOSIS — Z8742 Personal history of other diseases of the female genital tract: Secondary | ICD-10-CM | POA: Diagnosis not present

## 2014-05-12 DIAGNOSIS — M791 Myalgia, unspecified site: Secondary | ICD-10-CM

## 2014-05-12 LAB — CBC
HCT: 39.5 % (ref 36.0–46.0)
Hemoglobin: 13.4 g/dL (ref 12.0–15.0)
MCH: 32.2 pg (ref 26.0–34.0)
MCHC: 33.9 g/dL (ref 30.0–36.0)
MCV: 95 fL (ref 78.0–100.0)
PLATELETS: 229 10*3/uL (ref 150–400)
RBC: 4.16 MIL/uL (ref 3.87–5.11)
RDW: 13.2 % (ref 11.5–15.5)
WBC: 6.6 10*3/uL (ref 4.0–10.5)

## 2014-05-12 LAB — BASIC METABOLIC PANEL
ANION GAP: 12 (ref 5–15)
BUN: 7 mg/dL (ref 6–23)
CALCIUM: 9.8 mg/dL (ref 8.4–10.5)
CO2: 24 mEq/L (ref 19–32)
CREATININE: 0.82 mg/dL (ref 0.50–1.10)
Chloride: 107 mEq/L (ref 96–112)
GFR calc non Af Amer: 68 mL/min — ABNORMAL LOW (ref >90)
GFR, EST AFRICAN AMERICAN: 79 mL/min — AB (ref >90)
Glucose, Bld: 106 mg/dL — ABNORMAL HIGH (ref 70–99)
Potassium: 4 mEq/L (ref 3.7–5.3)
SODIUM: 143 meq/L (ref 137–147)

## 2014-05-12 LAB — TROPONIN I

## 2014-05-12 NOTE — ED Notes (Signed)
Patient presents with c/o heaviness to her left chest that the pain goes through to her back

## 2014-05-12 NOTE — ED Provider Notes (Signed)
CSN: [id number]     Arrival date & time 05/12/14  0321 History   First MD Initiated Contact with Patient 05/12/14 [id number]     Chief Complaint  Patient presents with  . Chest Pain     (Consider location/radiation/quality/duration/timing/severity/associated sxs/prior Treatment) HPI This patient is a very pleasant 73 yo woman who is s/p MI followed by PCI in June 2015. She comes in with experience of tingling in her hands and arms and aching in her arms, back and legs She has had these symptoms pretty consistently since she was discharged following PCI. Her new medications at that time included Brilinta and Lipitor at 80mg  po qd.   The patient came to this ED with similar sx on April 22, 2014. This prompted her admission and repeat cardiac cath which showed patent stent. The patient was seen at  Her cardiologist's office by a MLP 2 days ago for same sx. At  That time, she was discontinued on Brilinta in favor of Plavix.   The patient comes back today because, she says, she does not want to injure her heart. She has not had chest pain. No diaphoresis. NO SOB.   Past Medical History  Diagnosis Date  . Hiatal hernia   . Peptic stricture of esophagus   . GERD (gastroesophageal reflux disease)     LA Class Grade C Erosive esophagitis  . Hyperlipidemia   . Internal hemorrhoids   . External hemorrhoids   . Adenomatous polyps 07/1997  . Osteopenia 01/2014     T score -2.0. FRAX 19.8%/4.8% with statistically significant decrease at measured sites  . Breast fibroadenoma   . CAD (coronary artery disease) 03/18/14    STEMI- stent to RCA and nonobstructive disease LAD and LCX  . 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 Pneumonia 1940's  . Skin cancer 2014    "forehead"   Past Surgical History  Procedure Laterality Date  . Thyroid cyst excision  1970's  . Bunionectomy Bilateral 04/04/2013  . Coronary angioplasty with stent placement   03/18/14    Xience DES to totally occl RCA, residual non obstructive disease to LAD and LCX  . Tonsillectomy and adenoidectomy  ~ 1950  . Abdominal hysterectomy  1977    Leiomyoma  . Breast cyst aspiration Right ~ 2011  . Esophagogastroduodenoscopy (egd) with esophageal dilation  2000's X 1  . Skin cancer excision  2014   Family History  Problem Relation Age of Onset  . Colon cancer Maternal Aunt   . Breast cancer Maternal Aunt     Age 63  . Colon polyps Mother   . Breast cancer Daughter     Age 50  . Diabetes Father   . Heart disease Brother     irregular heart beat   History  Substance Use Topics  . Smoking status: Never Smoker   . Smokeless tobacco: Never Used  . Alcohol Use: No   OB History   Grav Para Term Preterm Abortions TAB SAB Ect Mult Living   2 2 2       2      Review of Systems  10 point review of symptoms obtained and is negative with the exceptions of symptoms noted abov.e   Allergies  Aspirin  Home Medications   Prior to Admission medications   Medication Sig Start Date End Date Taking? Authorizing Provider  atorvastatin (LIPITOR) 80 MG tablet Take 1 tablet (80 mg total) by mouth daily at  6 PM. 03/20/14   Cecilie Kicks, NP  Calcium Carbonate-Vitamin D (CALCIUM 600 + D PO) Take 1 tablet by mouth 2 (two) times daily.     Historical Provider, MD  clopidogrel (PLAVIX) 75 MG tablet TAKE   300 MG  TODAY   4 TABS  AND  THEN   75 MG  EVERY DAY   THEREAFTER 05/10/14   [name], MD  metoprolol tartrate (LOPRESSOR) 25 MG tablet Take 0.5 tablets (12.5 mg total) by mouth 2 (two) times daily. 03/20/14   Cecilie Kicks, NP  nitroGLYCERIN (NITROSTAT) 0.4 MG SL tablet Place 1 tablet (0.4 mg total) under the tongue every 5 (five) minutes x 3 doses as needed for chest pain. 03/20/14   Cecilie Kicks, NP  nystatin-triamcinolone ointment [facility]) Apply 1 application topically daily as needed (for rash).    Historical Provider, MD  Omega-3 Fatty Acids (FISH OIL) 1000 MG CAPS Take  1 capsule by mouth 2 (two) times daily.     Historical Provider, MD  pantoprazole (PROTONIX) 40 MG tablet Take 1 tablet (40 mg total) by mouth daily. 03/20/14   Cecilie Kicks, NP   BP 119/60  Pulse 59  Temp(Src) 97.9 F (36.6 C) (Oral)  Resp 17  Ht 5' (1.524 m)  Wt 130 lb (58.968 kg)  BMI 25.39 kg/m2  SpO2 95% Physical Exam  Gen: well nourished and well developed appearing Head: NCAT Ears: normal to inspection Nose: normal to inspection, no epistaxis or drainage Mouth: oral mucsoa is well hydrated appearing, normal posterior oropharynx Neck: supple, no stridor CV: RRR, no murmur, palpable peripheral pulses Resp: lung sounds are clear to auscultation bilaterally, no wheeing or rhonchi or rales, normal respiratory effort.  Abd: soft, nontender, nondistended Extremities: normal to inspection.  Skin: warm and dry Neuro: CN ii - XII, no focal deficitis, 5/5 both arms and legs,  Psyche; normal affect, cooperative.   ED Course  Procedures (including critical care time) Labs Review  Results for orders placed during the hospital encounter of 05/12/14 (from the past 24 hour(s))  CBC     Status: None   Collection Time    05/12/14  4:03 AM      Result Value Ref Range   WBC 6.6  4.0 - 10.5 K/uL   RBC 4.16  3.87 - 5.11 MIL/uL   Hemoglobin 13.4  12.0 - 15.0 g/dL   HCT 39.5  36.0 - 46.0 %   MCV 95.0  78.0 - 100.0 fL   MCH 32.2  26.0 - 34.0 pg   MCHC 33.9  30.0 - 36.0 g/dL   RDW 13.2  11.5 - 15.5 %   Platelets 229  150 - 400 K/uL  BASIC METABOLIC PANEL     Status: Abnormal   Collection Time    05/12/14  4:03 AM      Result Value Ref Range   Sodium 143  137 - 147 mEq/L   Potassium 4.0  3.7 - 5.3 mEq/L   Chloride 107  96 - 112 mEq/L   CO2 24  19 - 32 mEq/L   Glucose, Bld 106 (*) 70 - 99 mg/dL   BUN 7  6 - 23 mg/dL   Creatinine, Ser 0.82  0.50 - 1.10 mg/dL   Calcium 9.8  8.4 - 10.5 mg/dL   GFR calc non Af Amer 68 (*) >90 mL/min   GFR calc Af Amer 79 (*) >90 mL/min   Anion gap 12   5 - 15  TROPONIN I  Status: None   Collection Time    05/12/14  4:03 AM      Result Value Ref Range   Troponin I <0.30  <0.30 ng/mL    Imaging Review Dg Chest 2 View  05/12/2014   CLINICAL DATA:  Chest pain  EXAM: CHEST  2 VIEW  COMPARISON:  04/22/2014  FINDINGS: Normal heart size and mediastinal contours. No acute infiltrate or edema. No effusion or pneumothorax. No acute osseous findings.  IMPRESSION: No active cardiopulmonary disease.   Electronically Signed   By: Jorje Guild M.D.   On: 05/12/2014 05:48   EKG: nsr, no acute ischemic changes, normal intervals, normal axis, normal qrs complex  MDM   Patient with atypical symptoms. I do not feel this is ACS. Her EKG, CXR, troponin are wnl.  I think the patient's sx may be signs of statin intolerance. The patient was actually started on highest dose of lipitor at 80mg  qd. I have suggested to the patient that she discontinue this medication as a trial and follow up with her cardiologist or PCP in the next week.   In any event, I think she is just fine to go home.    Elyn Peers, MD 05/12/14 [phone]

## 2014-05-12 NOTE — ED Notes (Signed)
The pt just returned from xray.  The pt thinks she is having a reaction to some of her meds. She has pressure sensation to her face and her arms.  Numbness and tingling in both her arms and sometimes her face.  She just saw her cardiologist and her  Blood thinner was switched to plavix .  She is in cardiac rehab

## 2014-05-12 NOTE — Discharge Instructions (Signed)
STOP LIPITOR.   FOLLOW UP THIS WEEK WITH EITHER YOUR CARDIOLOGIST OR YOUR PRIMARY CARE PROVIDER.

## 2014-05-22 ENCOUNTER — Telehealth: Payer: Self-pay | Admitting: Cardiovascular Disease

## 2014-05-22 DIAGNOSIS — I2119 ST elevation (STEMI) myocardial infarction involving other coronary artery of inferior wall: Secondary | ICD-10-CM

## 2014-05-22 NOTE — Telephone Encounter (Signed)
New problem   Pt want to know if she need labs before her appt. Please call pt.

## 2014-05-22 NOTE — Telephone Encounter (Signed)
SPOKE WITH  PT  AND  AFTER  REVIEWING  LAST OFFICE  NOTE  DR   Johnsie Cancel  WOULD LIKE  PT  T O  HAVE  ECHO  AS  WELL  AND  SEE H IM SAME DAY .Adonis Housekeeper

## 2014-05-23 NOTE — Telephone Encounter (Signed)
SEE APPTS./CY

## 2014-05-30 ENCOUNTER — Observation Stay (HOSPITAL_COMMUNITY)
Admission: EM | Admit: 2014-05-30 | Discharge: 2014-05-31 | Disposition: A | Discharge status: Home / Self Care | Payer: Medicare Other | Attending: Cardiology | Admitting: Cardiology

## 2014-05-30 ENCOUNTER — Emergency Department (HOSPITAL_COMMUNITY): Payer: Medicare Other

## 2014-05-30 ENCOUNTER — Encounter (HOSPITAL_COMMUNITY): Payer: Self-pay | Admitting: Emergency Medicine

## 2014-05-30 DIAGNOSIS — K449 Diaphragmatic hernia without obstruction or gangrene: Secondary | ICD-10-CM | POA: Diagnosis not present

## 2014-05-30 DIAGNOSIS — R079 Chest pain, unspecified: Secondary | ICD-10-CM | POA: Diagnosis present

## 2014-05-30 DIAGNOSIS — Z79899 Other long term (current) drug therapy: Secondary | ICD-10-CM | POA: Diagnosis not present

## 2014-05-30 DIAGNOSIS — I25118 Atherosclerotic heart disease of native coronary artery with other forms of angina pectoris: Secondary | ICD-10-CM

## 2014-05-30 DIAGNOSIS — Z8701 Personal history of pneumonia (recurrent): Secondary | ICD-10-CM | POA: Insufficient documentation

## 2014-05-30 DIAGNOSIS — K644 Residual hemorrhoidal skin tags: Secondary | ICD-10-CM | POA: Diagnosis not present

## 2014-05-30 DIAGNOSIS — M899 Disorder of bone, unspecified: Secondary | ICD-10-CM | POA: Diagnosis not present

## 2014-05-30 DIAGNOSIS — Z7902 Long term (current) use of antithrombotics/antiplatelets: Secondary | ICD-10-CM | POA: Diagnosis not present

## 2014-05-30 DIAGNOSIS — E785 Hyperlipidemia, unspecified: Secondary | ICD-10-CM | POA: Diagnosis not present

## 2014-05-30 DIAGNOSIS — K222 Esophageal obstruction: Secondary | ICD-10-CM | POA: Diagnosis not present

## 2014-05-30 DIAGNOSIS — I2510 Atherosclerotic heart disease of native coronary artery without angina pectoris: Secondary | ICD-10-CM | POA: Diagnosis present

## 2014-05-30 DIAGNOSIS — I209 Angina pectoris, unspecified: Secondary | ICD-10-CM | POA: Diagnosis not present

## 2014-05-30 DIAGNOSIS — K648 Other hemorrhoids: Secondary | ICD-10-CM | POA: Insufficient documentation

## 2014-05-30 DIAGNOSIS — E7800 Pure hypercholesterolemia, unspecified: Secondary | ICD-10-CM | POA: Diagnosis present

## 2014-05-30 DIAGNOSIS — Z85828 Personal history of other malignant neoplasm of skin: Secondary | ICD-10-CM | POA: Insufficient documentation

## 2014-05-30 DIAGNOSIS — I2119 ST elevation (STEMI) myocardial infarction involving other coronary artery of inferior wall: Secondary | ICD-10-CM

## 2014-05-30 DIAGNOSIS — I252 Old myocardial infarction: Secondary | ICD-10-CM | POA: Diagnosis not present

## 2014-05-30 DIAGNOSIS — K219 Gastro-esophageal reflux disease without esophagitis: Secondary | ICD-10-CM | POA: Diagnosis present

## 2014-05-30 DIAGNOSIS — Z860101 Personal history of adenomatous and serrated colon polyps: Secondary | ICD-10-CM | POA: Diagnosis present

## 2014-05-30 DIAGNOSIS — Z9861 Coronary angioplasty status: Secondary | ICD-10-CM | POA: Insufficient documentation

## 2014-05-30 DIAGNOSIS — Z23 Encounter for immunization: Secondary | ICD-10-CM | POA: Diagnosis not present

## 2014-05-30 DIAGNOSIS — I25119 Atherosclerotic heart disease of native coronary artery with unspecified angina pectoris: Secondary | ICD-10-CM

## 2014-05-30 DIAGNOSIS — R0789 Other chest pain: Secondary | ICD-10-CM | POA: Diagnosis not present

## 2014-05-30 DIAGNOSIS — M949 Disorder of cartilage, unspecified: Secondary | ICD-10-CM

## 2014-05-30 DIAGNOSIS — Z86010 Personal history of colonic polyps: Secondary | ICD-10-CM | POA: Diagnosis present

## 2014-05-30 DIAGNOSIS — I10 Essential (primary) hypertension: Secondary | ICD-10-CM | POA: Diagnosis present

## 2014-05-30 HISTORY — DX: Essential (primary) hypertension: I10

## 2014-05-30 LAB — CBC
HCT: 38.4 % (ref 36.0–46.0)
HEMATOCRIT: 34.6 % — AB (ref 36.0–46.0)
HEMOGLOBIN: 11.8 g/dL — AB (ref 12.0–15.0)
Hemoglobin: 13.1 g/dL (ref 12.0–15.0)
MCH: 33.1 pg (ref 26.0–34.0)
MCH: 32.4 pg (ref 26.0–34.0)
MCHC: 34.1 g/dL (ref 30.0–36.0)
MCHC: 34.1 g/dL (ref 30.0–36.0)
MCV: 97 fL (ref 78.0–100.0)
MCV: 95.1 fL (ref 78.0–100.0)
Platelets: 221 10*3/uL (ref 150–400)
Platelets: 194 10*3/uL (ref 150–400)
RBC: 3.96 MIL/uL (ref 3.87–5.11)
RBC: 3.64 MIL/uL — ABNORMAL LOW (ref 3.87–5.11)
RDW: 13.3 % (ref 11.5–15.5)
RDW: 13.2 % (ref 11.5–15.5)
WBC: 4.6 10*3/uL (ref 4.0–10.5)
WBC: 4 10*3/uL (ref 4.0–10.5)

## 2014-05-30 LAB — COMPREHENSIVE METABOLIC PANEL
ALT: 15 U/L (ref 0–35)
AST: 16 U/L (ref 0–37)
Albumin: 4.3 g/dL (ref 3.5–5.2)
Alkaline Phosphatase: 73 U/L (ref 39–117)
Anion gap: 14 (ref 5–15)
BUN: 7 mg/dL (ref 6–23)
CALCIUM: 9.8 mg/dL (ref 8.4–10.5)
CO2: 24 mEq/L (ref 19–32)
Chloride: 105 mEq/L (ref 96–112)
Creatinine, Ser: 0.75 mg/dL (ref 0.50–1.10)
GFR calc non Af Amer: 81 mL/min — ABNORMAL LOW (ref >90)
GLUCOSE: 93 mg/dL (ref 70–99)
Potassium: 4.4 mEq/L (ref 3.7–5.3)
SODIUM: 143 meq/L (ref 137–147)
Total Bilirubin: 1 mg/dL (ref 0.3–1.2)
Total Protein: 7 g/dL (ref 6.0–8.3)

## 2014-05-30 LAB — CREATININE, SERUM
Creatinine, Ser: 0.79 mg/dL (ref 0.50–1.10)
GFR, EST NON AFRICAN AMERICAN: 79 mL/min — AB (ref >90)

## 2014-05-30 LAB — I-STAT TROPONIN, ED: Troponin i, poc: 0.02 ng/mL (ref 0.00–0.08)

## 2014-05-30 LAB — TROPONIN I

## 2014-05-30 LAB — TSH: TSH: 1.1 u[IU]/mL (ref 0.350–4.500)

## 2014-05-30 LAB — MAGNESIUM: MAGNESIUM: 2 mg/dL (ref 1.5–2.5)

## 2014-05-30 LAB — PROTIME-INR
INR: 1.21 (ref 0.00–1.49)
Prothrombin Time: 15.3 seconds — ABNORMAL HIGH (ref 11.6–15.2)

## 2014-05-30 MED ORDER — ASPIRIN 300 MG RE SUPP: 300.0000 mg | RECTAL | Status: DC

## 2014-05-30 MED ORDER — SODIUM CHLORIDE 0.9 % IJ SOLN
3.0000 mL | Freq: Two times a day (BID) | INTRAMUSCULAR | Status: DC
  Administered 2014-05-31: 3 mL via INTRAVENOUS

## 2014-05-30 MED ORDER — CYCLOBENZAPRINE HCL 5 MG PO TABS
2.5000 mg | ORAL_TABLET | Freq: Three times a day (TID) | ORAL | Status: DC | PRN
  Filled 2014-05-30: qty 0.5

## 2014-05-30 MED ORDER — FISH OIL 1000 MG PO CAPS: 1.0000 | ORAL_CAPSULE | Freq: Two times a day (BID) | ORAL | Status: DC

## 2014-05-30 MED ORDER — HEPARIN SODIUM (PORCINE) 5000 UNIT/ML IJ SOLN
5000.0000 [IU] | Freq: Three times a day (TID) | INTRAMUSCULAR | Status: DC
  Administered 2014-05-30 – 2014-05-31 (×2): 5000 [IU] via SUBCUTANEOUS
  Filled 2014-05-30 (×5): qty 1

## 2014-05-30 MED ORDER — NITROGLYCERIN 0.4 MG SL SUBL
0.4000 mg | SUBLINGUAL_TABLET | SUBLINGUAL | Status: DC | PRN
  Administered 2014-05-30: 0.4 mg via SUBLINGUAL
  Filled 2014-05-30: qty 1

## 2014-05-30 MED ORDER — ASPIRIN 81 MG PO CHEW: 324.0000 mg | CHEWABLE_TABLET | ORAL | Status: DC

## 2014-05-30 MED ORDER — METOPROLOL TARTRATE 12.5 MG HALF TABLET
12.5000 mg | ORAL_TABLET | Freq: Two times a day (BID) | ORAL | Status: DC
  Administered 2014-05-30 – 2014-05-31 (×2): 12.5 mg via ORAL
  Filled 2014-05-30 (×3): qty 1

## 2014-05-30 MED ORDER — PNEUMOCOCCAL VAC POLYVALENT 25 MCG/0.5ML IJ INJ
0.5000 mL | INJECTION | INTRAMUSCULAR | Status: AC
  Administered 2014-05-31: 0.5 mL via INTRAMUSCULAR
  Filled 2014-05-30: qty 0.5

## 2014-05-30 MED ORDER — ACETAMINOPHEN 325 MG PO TABS: 650.0000 mg | ORAL_TABLET | ORAL | Status: DC | PRN

## 2014-05-30 MED ORDER — INFLUENZA VAC SPLIT QUAD 0.5 ML IM SUSY
0.5000 mL | PREFILLED_SYRINGE | INTRAMUSCULAR | Status: AC
  Administered 2014-05-31: 0.5 mL via INTRAMUSCULAR
  Filled 2014-05-30: qty 0.5

## 2014-05-30 MED ORDER — SODIUM CHLORIDE 0.9 % IV SOLN: 250.0000 mL | INTRAVENOUS | Status: DC | PRN

## 2014-05-30 MED ORDER — CLOPIDOGREL BISULFATE 75 MG PO TABS
75.0000 mg | ORAL_TABLET | Freq: Every day | ORAL | Status: DC
  Administered 2014-05-30: 75 mg via ORAL
  Filled 2014-05-30 (×2): qty 1

## 2014-05-30 MED ORDER — ONDANSETRON HCL 4 MG/2ML IJ SOLN: 4.0000 mg | Freq: Four times a day (QID) | INTRAMUSCULAR | Status: DC | PRN

## 2014-05-30 MED ORDER — ASPIRIN EC 81 MG PO TBEC: 81.0000 mg | DELAYED_RELEASE_TABLET | Freq: Every day | ORAL | Status: DC

## 2014-05-30 MED ORDER — SODIUM CHLORIDE 0.9 % IJ SOLN: 3.0000 mL | INTRAMUSCULAR | Status: DC | PRN

## 2014-05-30 MED ORDER — NITROGLYCERIN 0.4 MG SL SUBL: 0.4000 mg | SUBLINGUAL_TABLET | SUBLINGUAL | Status: DC | PRN

## 2014-05-30 MED ORDER — LISINOPRIL 2.5 MG PO TABS
2.5000 mg | ORAL_TABLET | Freq: Every day | ORAL | Status: DC
  Administered 2014-05-31: 2.5 mg via ORAL
  Filled 2014-05-30 (×2): qty 1

## 2014-05-30 MED ORDER — SIMVASTATIN 10 MG PO TABS
10.0000 mg | ORAL_TABLET | Freq: Every day | ORAL | Status: DC
  Administered 2014-05-31: 10 mg via ORAL
  Filled 2014-05-30: qty 1

## 2014-05-30 MED ORDER — PANTOPRAZOLE SODIUM 40 MG PO TBEC
40.0000 mg | DELAYED_RELEASE_TABLET | Freq: Every day | ORAL | Status: DC
  Administered 2014-05-31: 40 mg via ORAL
  Filled 2014-05-30: qty 1

## 2014-05-30 MED ORDER — OMEGA-3-ACID ETHYL ESTERS 1 G PO CAPS
1.0000 g | ORAL_CAPSULE | Freq: Two times a day (BID) | ORAL | Status: DC
  Administered 2014-05-30 – 2014-05-31 (×2): 1 g via ORAL
  Filled 2014-05-30 (×3): qty 1

## 2014-05-30 NOTE — ED Provider Notes (Signed)
TIME SEEN: 5:20 PM  CHIEF COMPLAINT: Chest pain  HPI: Patient is a 73 year old female with history of GERD, hyperlipidemia, STEMI June 2015 with a stent to her RCA who presents emergency department with chest pain. Patient reports that since her cardiac catheterization she has had chest pain almost every single day. She does have some episodes of burning pain and some episodes of pressure. She will intermittently use nitroglycerin at home but inconsistently provides relief. She was seen again on 04/22/14 for chest pain and had a repeat cardiac catheterization that showed a patent RCA stent. At that time she did have a 20-30% stenosis of her proximal LAD, 40% stenosis of her left circumflex, 40-50% stenosis of her distal RCA. Today she presents with chest pressure that is central in nature that radiates into the right side of her neck intermittently and a right-sided throbbing chest pain that started this morning when she woke up from sleep. She denies any shortness of breath. No nausea, vomiting, lightheadedness or diaphoresis. She states she feels better when she takes a deep breath. She does have some tingling in bilateral arms. She states that her heart attack felt like a "indigestion". She states that the chest pressure she had today he did feel somewhat like her MI. She did not try any nitroglycerin at home today. She denies any fevers or cough, lower extremity swelling or pain, history of PE or DVT.  She went to urgent care prior to coming to the emergency department.   Her cardiologist is Dr. Johnsie Cancel  ROS: See HPI Constitutional: no fever  Eyes: no drainage  ENT: no runny nose   Cardiovascular:   chest pain  Resp: no SOB  GI: no vomiting GU: no dysuria Integumentary: no rash  Allergy: no hives  Musculoskeletal: no leg swelling  Neurological: no slurred speech ROS otherwise negative  PAST MEDICAL HISTORY/PAST SURGICAL HISTORY:  Past Medical History  Diagnosis Date  . Hiatal hernia   .  Peptic stricture of esophagus   . GERD (gastroesophageal reflux disease)     LA Class Grade C Erosive esophagitis  . Hyperlipidemia   . Internal hemorrhoids   . External hemorrhoids   . Adenomatous polyps 07/1997  . Osteopenia 01/2014     T score -2.0. FRAX 19.8%/4.8% with statistically significant decrease at measured sites  . Breast fibroadenoma   . CAD (coronary artery disease) 03/18/14    STEMI- stent to RCA and nonobstructive disease LAD and LCX  . 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 Pneumonia 1940's  . Skin cancer 2014    "forehead"    MEDICATIONS:  Prior to Admission medications   Medication Sig Start Date End Date Taking? Authorizing Provider  atorvastatin (LIPITOR) 80 MG tablet Take 1 tablet (80 mg total) by mouth daily at 6 PM. 03/20/14   Cecilie Kicks, NP  Calcium Carbonate-Vitamin D (CALCIUM 600 + D PO) Take 1 tablet by mouth 2 (two) times daily.     Historical Provider, MD  clopidogrel (PLAVIX) 75 MG tablet TAKE   300 MG  TODAY   4 TABS  AND  THEN   75 MG  EVERY DAY   THEREAFTER 05/10/14   [name], MD  metoprolol tartrate (LOPRESSOR) 25 MG tablet Take 0.5 tablets (12.5 mg total) by mouth 2 (two) times daily. 03/20/14   Cecilie Kicks, NP  nitroGLYCERIN (NITROSTAT) 0.4 MG SL tablet Place 1 tablet (0.4 mg total) under the tongue every 5 (  five) minutes x 3 doses as needed for chest pain. 03/20/14   Cecilie Kicks, NP  nystatin-triamcinolone ointment [facility]) Apply 1 application topically daily as needed (for rash).    Historical Provider, MD  Omega-3 Fatty Acids (FISH OIL) 1000 MG CAPS Take 1 capsule by mouth 2 (two) times daily.     Historical Provider, MD  pantoprazole (PROTONIX) 40 MG tablet Take 1 tablet (40 mg total) by mouth daily. 03/20/14   Cecilie Kicks, NP    ALLERGIES:  Allergies  Allergen Reactions  . Aspirin     REACTION: hives and diarrhea    SOCIAL HISTORY:  History  Substance Use Topics  .  Smoking status: Never Smoker   . Smokeless tobacco: Never Used  . Alcohol Use: No    FAMILY HISTORY: Family History  Problem Relation Age of Onset  . Colon cancer Maternal Aunt   . Breast cancer Maternal Aunt     Age 62  . Colon polyps Mother   . Breast cancer Daughter     Age 30  . Diabetes Father   . Heart disease Brother     irregular heart beat    EXAM: BP 144/94  Pulse 81  Temp(Src) 98.1 F (36.7 C) (Oral)  Resp 17  Ht 5' (1.524 m)  Wt 128 lb (58.06 kg)  BMI 25.00 kg/m2  SpO2 99% CONSTITUTIONAL: Alert and oriented and responds appropriately to questions. Well-appearing; well-nourished HEAD: Normocephalic EYES: Conjunctivae clear, PERRL ENT: normal nose; no rhinorrhea; moist mucous membranes; pharynx without lesions noted NECK: Supple, no meningismus, no LAD  CARD: RRR; S1 and S2 appreciated; no murmurs, no clicks, no rubs, no gallops RESP: Normal chest excursion without splinting or tachypnea; breath sounds clear and equal bilaterally; no wheezes, no rhonchi, no rales, chest wall is nontender to palpation and there is no crepitus or ecchymosis or deformity ABD/GI: Normal bowel sounds; non-distended; soft, non-tender, no rebound, no guarding BACK:  The back appears normal and is non-tender to palpation, there is no CVA tenderness EXT: Normal ROM in all joints; non-tender to palpation; no edema; normal capillary refill; no cyanosis; no calf tenderness    SKIN: Normal color for age and race; warm NEURO: Moves all extremities equally PSYCH: The patient's mood and manner are appropriate. Grooming and personal hygiene are appropriate.  MEDICAL DECISION MAKING: Patient here with chest pain. Her some symptoms that are concerning including her pressure with radiation into her jaw she does also have some atypical features. She states she's had constant pain since this morning and that she has pain every day. Doubt that this is cardiac in nature but will consult her cardiologist.  Her EKG shows no ischemic changes or arrhythmia. Troponin negative. Chest x-ray clear. She is unable to take aspirin and she reports she has an allergy to this medication. We'll give nitroglycerin to see if this helps with her chest pain.  ED PROGRESS:  cardiology has seen the patient and has admitted for observation.      EKG Interpretation  Date/Time:  Thursday May 30 2014 15:32:50 EDT Ventricular Rate:  82 PR Interval:  148 QRS Duration: 82 QT Interval:  390 QTC Calculation: 455 R Axis:   -30 Text Interpretation:  Normal sinus rhythm Left axis deviation Abnormal ECG Confirmed by Marye Eagen,  DO, Inga Noller ([id number]) on 05/30/2014 5:15:04 PM          [place], DO 05/30/14 [id number]

## 2014-05-30 NOTE — ED Notes (Signed)
Pt presents with chest pain that started this morning- reports pain was aching at first and now she is having sharp pains into her chest.  Pt reports tingling to bilateral arms that radiates into her jaw.  Denis SOB.  No distress noted at present.  Pt reports having a cardiac cath completed on 04/22/14 for this same tingling sensation- pt reports the chest pain that is present today is different.

## 2014-05-30 NOTE — ED Notes (Signed)
MD at bedside. Cards 

## 2014-05-30 NOTE — ED Notes (Signed)
MD at bedside. 

## 2014-05-30 NOTE — H&P (Signed)
Patient ID: KYELLE URBAS MRN: [id number], DOB/AGE: [birth year]   Admit date: 05/30/2014   Primary Physician: Gilford Rile, MD Primary Cardiologist: Dr. Johnsie Cancel  Pt. Profile:  Sarah Ray is a 73 y.o. female with a history of GERD, hyperlipidemia, STEMI 02/2014 s/p DES to RCA and repeat LHC with patent stents in 04/2014 and chronic chest pain since who presented to [facility] ED today with chest pain.  Patient reports that since her cardiac catheterization she has had chest pain almost every single day. She does have some episodes of burning pain and some episodes of pressure. She will intermittently use nitroglycerin at home but inconsistently provides relief. She was seen again on 04/22/14 for chest pain and had a repeat cardiac catheterization that showed a PA and mid RCA stent. At that time she did have a 20-30% stenosis of her proximal LAD, 40% stenosis of her left circumflex, 40-50% stenosis of her distal RCA. Today she presents with chest pressure that is central in nature that radiates into the right side of her neck intermittently and a right-sided throbbing chest pain that started this morning when she woke up from sleep. She usually feels better with exercise. Today there was more pain than usual; she took two walks and it would not go away so she was concerned and presented to urgent care. She denies any shortness of breath. No nausea, vomiting, lightheadedness or diaphoresis. She states she feels better when she takes a deep breath. She does have some tingling in bilateral arms. She states that her heart attack felt like a "indigestion". She states that the chest pressure she had today he did feel somewhat like her MI. She did not try any nitroglycerin at home today. She denies any fevers or cough, lower extremity swelling or pain, history of PE or DVT.    Problem List  Past Medical History  Diagnosis Date  . Hiatal hernia   . Peptic stricture of esophagus   . GERD (gastroesophageal reflux  disease)     LA Class Grade C Erosive esophagitis  . Hyperlipidemia   . Internal hemorrhoids   . External hemorrhoids   . Adenomatous polyps 07/1997  . Osteopenia 01/2014     T score -2.0. FRAX 19.8%/4.8% with statistically significant decrease at measured sites  . Breast fibroadenoma   . CAD (coronary artery disease) 03/18/14    STEMI- stent to RCA and nonobstructive disease LAD and LCX  . 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 Pneumonia 1940's  . Skin cancer 2014    "forehead"    Past Surgical History  Procedure Laterality Date  . Thyroid cyst excision  1970's  . Bunionectomy Bilateral 04/04/2013  . Coronary angioplasty with stent placement  03/18/14    Xience DES to totally occl RCA, residual non obstructive disease to LAD and LCX  . Tonsillectomy and adenoidectomy  ~ 1950  . Abdominal hysterectomy  1977    Leiomyoma  . Breast cyst aspiration Right ~ 2011  . Esophagogastroduodenoscopy (egd) with esophageal dilation  2000's X 1  . Skin cancer excision  2014     Allergies  Allergies  Allergen Reactions  . Brilinta [Ticagrelor] Other (See Comments)    Caused chest pains  . Aspirin Hives, Diarrhea and Swelling    Benadryl stops reaction  . Lipitor [Atorvastatin] Other (See Comments)    Myalgias and leg heaviness  . Other Other (See Comments)    Very sensitive to  all meds     Home Medications  Prior to Admission medications   Medication Sig Start Date End Date Taking? Authorizing Provider  Calcium Carbonate-Vitamin D (CALCIUM 600 + D PO) Take 1 tablet by mouth 2 (two) times daily.    Yes Historical Provider, MD  clopidogrel (PLAVIX) 75 MG tablet Take 75 mg by mouth at bedtime.   Yes Historical Provider, MD  cyclobenzaprine (FLEXERIL) 5 MG tablet Take 2.5 mg by mouth 3 (three) times daily as needed for muscle spasms.   Yes Historical Provider, MD  metoprolol tartrate (LOPRESSOR) 25 MG tablet Take 0.5  tablets (12.5 mg total) by mouth 2 (two) times daily. 03/20/14  Yes Cecilie Kicks, NP  nitroGLYCERIN (NITROSTAT) 0.4 MG SL tablet Place 1 tablet (0.4 mg total) under the tongue every 5 (five) minutes x 3 doses as needed for chest pain. 03/20/14  Yes Cecilie Kicks, NP  nystatin-triamcinolone ointment [facility]) Apply 1 application topically daily as needed (for rash).   Yes Historical Provider, MD  Omega-3 Fatty Acids (FISH OIL) 1000 MG CAPS Take 1 capsule by mouth 2 (two) times daily.    Yes Historical Provider, MD  pantoprazole (PROTONIX) 40 MG tablet Take 1 tablet (40 mg total) by mouth daily. 03/20/14  Yes Cecilie Kicks, NP    Family History  Family History  Problem Relation Age of Onset  . Colon cancer Maternal Aunt   . Breast cancer Maternal Aunt     Age 19  . Colon polyps Mother   . Breast cancer Daughter     Age 42  . Diabetes Father   . Heart disease Brother     irregular heart beat   No family status information on file.     Social History  History   Social History  . Marital Status: Married    Spouse Name: N/A    Number of Children: 2  . Years of Education: N/A   Occupational History  . Dance movement psychotherapist    Social History Main Topics  . Smoking status: Never Smoker   . Smokeless tobacco: Never Used  . Alcohol Use: No  . Drug Use: No  . Sexual Activity: Not Currently    Birth Control/ Protection: Surgical, Post-menopausal     Comment: HYST   Other Topics Concern  . Not on file   Social History Narrative   Daily caffeine  2 cups per day     All other systems reviewed and are otherwise negative except as noted above.  Physical Exam  Blood pressure 145/70, pulse 59, temperature 98.1 F (36.7 C), temperature source Oral, resp. rate 15, height 5' (1.524 m), weight 128 lb (58.06 kg), SpO2 99.00%.  General: Pleasant, NAD Psych: Normal affect. Neuro: Alert and oriented X 3. Moves all extremities spontaneously. HEENT: Normal  Neck: Supple without bruits or  JVD. Lungs:  Resp regular and unlabored, CTA. Heart: RRR no s3, s4, or murmurs. Abdomen: Soft, non-tender, non-distended, BS + x 4.  Extremities: No clubbing, cyanosis or edema. DP/PT/Radials 2+ and equal bilaterally.  Labs  No results found for this basename: CKTOTAL, CKMB, TROPONINI,  in the last 72 hours Lab Results  Component Value Date   WBC 4.6 05/30/2014   HGB 13.1 05/30/2014   HCT 38.4 05/30/2014   MCV 97.0 05/30/2014   PLT 221 05/30/2014    Recent Labs Lab 05/30/14 1520  NA 143  K 4.4  CL 105  CO2 24  BUN 7  CREATININE 0.75  CALCIUM 9.8  PROT  7.0  BILITOT 1.0  ALKPHOS 73  ALT 15  AST 16  GLUCOSE 93   Lab Results  Component Value Date   CHOL 185 03/19/2014   HDL 40 03/19/2014   LDLCALC 114* 03/19/2014   TRIG 157* 03/19/2014      Radiology/Studies  Dg Chest 2 View  05/30/2014   CLINICAL DATA:  Chest pain  EXAM: CHEST  2 VIEW  COMPARISON:  Prior radiograph from 05/12/2014  FINDINGS: The cardiac and mediastinal silhouettes are stable in size and contour, and remain within normal limits.  The lungs are normally inflated. No airspace consolidation, pleural effusion, or pulmonary edema is identified. There is no pneumothorax.  No acute osseous abnormality identified.  IMPRESSION: No active cardiopulmonary disease.   Electronically Signed   By: Jeannine Boga M.D.   On: 05/30/2014 16:06   Dg Chest 2 View  05/12/2014   CLINICAL DATA:  Chest pain  EXAM: CHEST  2 VIEW  COMPARISON:  04/22/2014  FINDINGS: Normal heart size and mediastinal contours. No acute infiltrate or edema. No effusion or pneumothorax. No acute osseous findings.  IMPRESSION: No active cardiopulmonary disease.   Electronically Signed   By: Jorje Guild M.D.   On: 05/12/2014 05:48    ECG  ECG with subtle non specfic ST and TW changes.  ASSESSMENT AND PLAN  EDDIE KOC is a 73 y.o. female with a history of GERD, hyperlipidemia, STEMI 02/2014 s/p DES to RCA and repeat LHC with patent stents in  04/2014 and chronic chest pain since who presented to [facility] ED today with chest pain.  Chest pain --Troponin x1 negative, ECG with some non specific  ST / TW changes -- Will admit to telemtry for observation --Cycle Troponin and serial ECGs -- Continue ASA/Plavix, lopressor 12.5mg  BID. Myalgias with Lipitor 80. Will trial another statin. -- No heparin unless enzymes turn positive. Will keep NPO after midnight for possible stress test in the AM. MD to see.   HTN- well controlled. Continue lopressor 12.5 mg BID  HLD- intolerant to HD statin due to myalgias. PCP recently took her off Lipitor 80mg . Requests a low dose of simvastatin as her husband tolerates that. Will try simvastatin 10mg .  Right back pain- concerning for radiculopathy. Recommended following up with a orthopedic surgeon   Signed, Eileen Stanford, PA-C 05/30/2014, 6:20 PM  Pager [phone]  The patient was seen, examined and discussed with Lorretta Harp, PA-C and I agree with the above.   A 73 year old female with known CAD, STEMI in 02/2014 s/p DES to RCA and repeat LHC with patent stents in 04/2014 and chronic chest pain since who presented to [facility] ED today with chest pain. The description of pain - back pain, neck pain, arm numbness, tingling, feeling better on work-out day, non-exertional, fit a picture of radiculopathy. The first troponin was negative and ECG unchanged. If negative x 3 we will probably discharge in the am with referral to orthopedic surgery for spine MRI. Will admit to telemtry for observation. Continue ASA/Plavix, lopressor 12.5mg  BID. Myalgias with Lipitor 80. We will try pravastatin and also start low dose lisinopril 2.5 mg po daily.  Dorothy Spark 05/30/2014

## 2014-05-31 ENCOUNTER — Other Ambulatory Visit: Payer: Self-pay | Admitting: Physician Assistant

## 2014-05-31 ENCOUNTER — Encounter (HOSPITAL_COMMUNITY): Payer: Self-pay | Admitting: General Practice

## 2014-05-31 DIAGNOSIS — I10 Essential (primary) hypertension: Secondary | ICD-10-CM | POA: Diagnosis present

## 2014-05-31 DIAGNOSIS — E785 Hyperlipidemia, unspecified: Secondary | ICD-10-CM

## 2014-05-31 LAB — BASIC METABOLIC PANEL
ANION GAP: 11 (ref 5–15)
BUN: 8 mg/dL (ref 6–23)
CO2: 26 meq/L (ref 19–32)
CREATININE: 0.8 mg/dL (ref 0.50–1.10)
Calcium: 8.9 mg/dL (ref 8.4–10.5)
Chloride: 105 mEq/L (ref 96–112)
GFR calc non Af Amer: 70 mL/min — ABNORMAL LOW (ref >90)
GFR, EST AFRICAN AMERICAN: 82 mL/min — AB (ref >90)
Glucose, Bld: 90 mg/dL (ref 70–99)
Potassium: 3.6 mEq/L — ABNORMAL LOW (ref 3.7–5.3)
Sodium: 142 mEq/L (ref 137–147)

## 2014-05-31 LAB — CBC
HCT: 34.7 % — ABNORMAL LOW (ref 36.0–46.0)
Hemoglobin: 11.8 g/dL — ABNORMAL LOW (ref 12.0–15.0)
MCH: 32.5 pg (ref 26.0–34.0)
MCHC: 34 g/dL (ref 30.0–36.0)
MCV: 95.6 fL (ref 78.0–100.0)
PLATELETS: 190 10*3/uL (ref 150–400)
RBC: 3.63 MIL/uL — ABNORMAL LOW (ref 3.87–5.11)
RDW: 13.3 % (ref 11.5–15.5)
WBC: 3.7 10*3/uL — ABNORMAL LOW (ref 4.0–10.5)

## 2014-05-31 LAB — PROTIME-INR
INR: 1.15 (ref 0.00–1.49)
Prothrombin Time: 14.7 seconds (ref 11.6–15.2)

## 2014-05-31 LAB — LIPID PANEL
CHOL/HDL RATIO: 3.9 ratio
Cholesterol: 164 mg/dL (ref 0–200)
HDL: 42 mg/dL (ref >39)
LDL Cholesterol: 107 mg/dL — ABNORMAL HIGH (ref 0–99)
TRIGLYCERIDES: 74 mg/dL (ref <150)
VLDL: 15 mg/dL (ref 0–40)

## 2014-05-31 LAB — TROPONIN I
Troponin I: <0.3 ng/mL (ref <0.30)
Troponin I: <0.3 ng/mL (ref <0.30)

## 2014-05-31 MED ORDER — PRAVASTATIN SODIUM 20 MG PO TABS: 20.0000 mg | ORAL_TABLET | Freq: Every day | ORAL | Status: DC

## 2014-05-31 MED ORDER — POTASSIUM CHLORIDE CRYS ER 20 MEQ PO TBCR
40.0000 meq | EXTENDED_RELEASE_TABLET | Freq: Once | ORAL | Status: AC
  Administered 2014-05-31: 40 meq via ORAL
  Filled 2014-05-31: qty 2

## 2014-05-31 MED ORDER — LISINOPRIL 2.5 MG PO TABS: 2.5000 mg | ORAL_TABLET | Freq: Every day | ORAL | Status: DC

## 2014-05-31 NOTE — Progress Notes (Signed)
Reviewed discharge instructions with patient and husband and they stated their understanding.  Discharged home with husband via wheelchair. Patient request to make her own primary care followup appointment.  Sanda Linger Filed Vitals:   05/31/14 0351  BP: 106/66  Pulse: 56  Temp: 98.5 F (36.9 C)  Resp: 18

## 2014-05-31 NOTE — Discharge Instructions (Signed)

## 2014-05-31 NOTE — Discharge Summary (Signed)
Discharge Summary   Patient ID: CAYA SOBERANIS MRN: [id number], DOB/AGE: September 20, 1941 73 y.o. Admit date: 05/30/2014 D/C date:     05/31/2014  Primary Cardiologist: Dr. Johnsie Cancel  Principal Problem:   Chest pain Active Problems:   ADENOMATOUS COLONIC POLYP   HYPERCHOLESTEROLEMIA   HEMORRHOIDS, EXTERNAL   GERD   CAD (coronary artery disease)   HTN (hypertension)    Admission Dates: 05/30/14- 05/31/14 Discharge Diagnosis: chest pain s/p overnight observation for ischemic rule out.  HPI: Sarah Ray is a 73 y.o. female with a history of GERD, hyperlipidemia, CAD s/p STEMI 02/2014 s/p DES to RCA; repeat LHC with patent stents in 04/2014 and chronic chest pain since who presented to [facility] ED on 05/30/14 with recurrent chest pain.   Patient reports that since her cardiac catheterization in August she has had chest pain almost every single day. She does have some episodes of burning pain and some of pressure; the character and quality of the chest pain varies. She will intermittently use nitroglycerin at home but inconsistently provides relief. She presented initially with STEMI in 02/2014 and DES was placed to the RCA. She was seen again on 04/22/14 for chest pain and had a repeat cardiac catheterization that showed a PA and mid RCA stent. At that time she did have a 20-30% stenosis of her proximal LAD, 40% stenosis of her left circumflex, 40-50% stenosis of her distal RCA.   Upon admission on 05/30/14 she presented with chest pressure that was central in nature that radiated into the right side of her neck intermittently and a right-sided throbbing chest pain that started that morning when she woke up from sleep. She usually feels better with exercise. That day there was more pain than usual; she took two walks and it would not go away so she was concerned and presented to urgent care. She denied shortness of breath, nausea, vomiting, lightheadedness or diaphoresis. She had improvement with deep breath. She did  have some tingling in bilateral arms.   Hospital Course  Chest pain  -- Troponin x3 negative, ECG with no acute ST or TW changes  -- Continue Plavix (hx of SOB on Brilinta) and lopressor 12.5mg  BID. Hx of allergy to ASA. Myalgias with Lipitor 80. Placed on pravastatin 20mg  on this admission. -- No further chest pain.   HTN- well controlled. Continue lopressor 12.5 mg BID  -- Lisinopril 2.5mg  qd was also added to her regimen. CMET at follow up.  HLD- intolerant to HD statin due to myalgias. PCP recently took her off Lipitor 80mg . -- We will add pravastatin 20 mg po daily and have CMP in ~3 weeks.   Right back pain- The description of pain - back pain, neck pain, arm numbness, tingling, feeling better on work-out day, non-exertional, fits a picture of radiculopathy.  -- Made follow up appointment with ortho PA next Tuesday. Will likely need spine MRI  Hypokalemia- mild at 3.6. Will give her 1x supplementation of 14mEq Kdur.    The patient has had an uncomplicated hospital course and is recovering well.  She has been seen by Dr. Meda Coffee today and deemed ready for discharge home. All follow-up appointments have been scheduled. Discharge medications are listed below. We will add pravastatin 20 mg po daily and have CMP check prior to the next visit with Dr Johnsie Cancel on 06/17/14. She will also have an echo at that time. Lisinopril 2.5mg  qd was also added to her regimen.   Discharge Vitals: Blood pressure 106/66,  pulse 56, temperature 98.5 F (36.9 C), temperature source Oral, resp. rate 18, height 5' 1.5" (1.562 m), weight 125 lb 3.2 oz (56.79 kg), SpO2 98.00%.  Labs: Lab Results  Component Value Date   WBC 3.7* 05/31/2014   HGB 11.8* 05/31/2014   HCT 34.7* 05/31/2014   MCV 95.6 05/31/2014   PLT 190 05/31/2014    Recent Labs Lab 05/30/14 1520  05/31/14 0120  NA 143  --  142  K 4.4  --  3.6*  CL 105  --  105  CO2 24  --  26  BUN 7  --  8  CREATININE 0.75  < > 0.80  CALCIUM 9.8  --  8.9    PROT 7.0  --   --   BILITOT 1.0  --   --   ALKPHOS 73  --   --   ALT 15  --   --   AST 16  --   --   GLUCOSE 93  --  90  < > = values in this interval not displayed.  Recent Labs  05/30/14 2115 05/31/14 0120 05/31/14 0835  TROPONINI <0.30 <0.30 <0.30   Lab Results  Component Value Date   CHOL 164 05/31/2014   HDL 42 05/31/2014   LDLCALC 107* 05/31/2014   TRIG 74 05/31/2014    Diagnostic Studies/Procedures   Dg Chest 2 View  05/30/2014   CLINICAL DATA:  Chest pain  EXAM: CHEST  2 VIEW  COMPARISON:  Prior radiograph from 05/12/2014  FINDINGS: The cardiac and mediastinal silhouettes are stable in size and contour, and remain within normal limits.  The lungs are normally inflated. No airspace consolidation, pleural effusion, or pulmonary edema is identified. There is no pneumothorax.  No acute osseous abnormality identified.  IMPRESSION: No active cardiopulmonary disease.   Electronically Signed   By: Jeannine Boga M.D.   On: 05/30/2014 16:06   Dg Chest 2 View  05/12/2014   CLINICAL DATA:  Chest pain  EXAM: CHEST  2 VIEW  COMPARISON:  04/22/2014  FINDINGS: Normal heart size and mediastinal contours. No acute infiltrate or edema. No effusion or pneumothorax. No acute osseous findings.  IMPRESSION: No active cardiopulmonary disease.   Electronically Signed   By: Jorje Guild M.D.   On: 05/12/2014 05:48    Discharge Medications     Medication List         CALCIUM 600 + D PO  Take 1 tablet by mouth 2 (two) times daily.     clopidogrel 75 MG tablet  Commonly known as:  PLAVIX  Take 75 mg by mouth at bedtime.     cyclobenzaprine 5 MG tablet  Commonly known as:  FLEXERIL  Take 2.5 mg by mouth 3 (three) times daily as needed for muscle spasms.     Fish Oil 1000 MG Caps  Take 1 capsule by mouth 2 (two) times daily.     lisinopril 2.5 MG tablet  Commonly known as:  PRINIVIL,ZESTRIL  Take 1 tablet (2.5 mg total) by mouth daily.     metoprolol tartrate 25 MG tablet   Commonly known as:  LOPRESSOR  Take 0.5 tablets (12.5 mg total) by mouth 2 (two) times daily.     nitroGLYCERIN 0.4 MG SL tablet  Commonly known as:  NITROSTAT  Place 1 tablet (0.4 mg total) under the tongue every 5 (five) minutes x 3 doses as needed for chest pain.     nystatin-triamcinolone ointment  Commonly known as:  MYCOLOG  Apply 1 application topically daily as needed (for rash).     pantoprazole 40 MG tablet  Commonly known as:  PROTONIX  Take 1 tablet (40 mg total) by mouth daily.     pravastatin 20 MG tablet  Commonly known as:  PRAVACHOL  Take 1 tablet (20 mg total) by mouth daily.        Disposition   The patient will be discharged in stable condition to home.  Follow-up Information   Follow up with Jenkins Rouge, MD On 06/17/2014. (@ 2pm for ECHO and then appt with Dr. Johnsie Cancel at 3pm. Please arrive at 1pm for lab work (CMET))    Specialty:  Cardiology   Contact information:   [address] [place] [address] [phone]       Follow up with Jeri Cos, MD On 06/04/2014. (8am )    Specialty:  Physician Assistant   Contact information:   [facility] [address] [place] [address] [place] [address] [phone]         Duration of Discharge Encounter: Greater than 30 minutes including physician and PA time.  Mable Fill R PA-C 05/31/2014, 11:31 AM

## 2014-05-31 NOTE — Progress Notes (Addendum)
Patient Name: Sarah Ray Date of Encounter: 05/31/2014     Active Problems:   Chest pain    SUBJECTIVE  Feeling good. No further chest pain.   CURRENT MEDS . clopidogrel  75 mg Oral QHS  . heparin  5,000 Units Subcutaneous 3 times per day  . Influenza vac split quadrivalent PF  0.5 mL Intramuscular Tomorrow-1000  . lisinopril  2.5 mg Oral Daily  . metoprolol tartrate  12.5 mg Oral BID  . omega-3 acid ethyl esters  1 g Oral BID  . pantoprazole  40 mg Oral Daily  . pneumococcal 23 valent vaccine  0.5 mL Intramuscular Tomorrow-1000  . simvastatin  10 mg Oral q1800  . sodium chloride  3 mL Intravenous Q12H    OBJECTIVE  Filed Vitals:   05/30/14 1800 05/30/14 1900 05/30/14 1945 05/31/14 0351  BP: 137/74 112/58 132/65 106/66  Pulse: 58 59 76 56  Temp:   97.8 F (36.6 C) 98.5 F (36.9 C)  TempSrc:   Oral Oral  Resp: 18 15 18 18   Height:   5' 1.5" (1.562 m)   Weight:   125 lb 3.2 oz (56.79 kg) 125 lb 3.2 oz (56.79 kg)  SpO2: 100% 93% 98% 98%   No intake or output data in the 24 hours ending 05/31/14 0948 Filed Weights   05/30/14 1534 05/30/14 1945 05/31/14 0351  Weight: 128 lb (58.06 kg) 125 lb 3.2 oz (56.79 kg) 125 lb 3.2 oz (56.79 kg)    PHYSICAL EXAM  General: Pleasant, NAD. Neuro: Alert and oriented X 3. Moves all extremities spontaneously. Psych: Normal affect. HEENT:  Normal  Neck: Supple without bruits or JVD. Lungs:  Resp regular and unlabored, CTA. Heart: RRR no s3, s4, or murmurs. Abdomen: Soft, non-tender, non-distended, BS + x 4.  Extremities: No clubbing, cyanosis or edema. DP/PT/Radials 2+ and equal bilaterally.  Accessory Clinical Findings  CBC  Recent Labs  05/30/14 2115 05/31/14 0120  WBC 4.0 3.7*  HGB 11.8* 11.8*  HCT 34.6* 34.7*  MCV 95.1 95.6  PLT 194 419   Basic Metabolic Panel  Recent Labs  05/30/14 1520 05/30/14 2115 05/31/14 0120  NA 143  --  142  K 4.4  --  3.6*  CL 105  --  105  CO2 24  --  26  GLUCOSE 93  --   90  BUN 7  --  8  CREATININE 0.75 0.79 0.80  CALCIUM 9.8  --  8.9  MG  --  2.0  --    Liver Function Tests  Recent Labs  05/30/14 1520  AST 16  ALT 15  ALKPHOS 73  BILITOT 1.0  PROT 7.0  ALBUMIN 4.3     Cardiac Enzymes  Recent Labs  05/30/14 2115 05/31/14 0120  TROPONINI <0.30 <0.30   Fasting Lipid Panel  Recent Labs  05/31/14 0120  CHOL 164  HDL 42  LDLCALC 107*  TRIG 74  CHOLHDL 3.9   Thyroid Function Tests  Recent Labs  05/30/14 2115  TSH 1.100    TELE  NSR. Some periods of sinus brady  Radiology/Studies  Dg Chest 2 View  05/30/2014   CLINICAL DATA:  Chest pain  EXAM: CHEST  2 VIEW  COMPARISON:  Prior radiograph from 05/12/2014  FINDINGS: The cardiac and mediastinal silhouettes are stable in size and contour, and remain within normal limits.  The lungs are normally inflated. No airspace consolidation, pleural effusion, or pulmonary edema is identified. There is no pneumothorax.  No acute osseous abnormality identified.  IMPRESSION: No active cardiopulmonary disease.   Electronically Signed   By: Jeannine Boga M.D.   On: 05/30/2014 16:06   Dg Chest 2 View  05/12/2014   CLINICAL DATA:  Chest pain  EXAM: CHEST  2 VIEW  COMPARISON:  04/22/2014  FINDINGS: Normal heart size and mediastinal contours. No acute infiltrate or edema. No effusion or pneumothorax. No acute osseous findings.  IMPRESSION: No active cardiopulmonary disease.   Electronically Signed   By: Jorje Guild M.D.   On: 05/12/2014 05:48    ASSESSMENT AND PLAN  Sarah Ray is a 73 y.o. female with a history of GERD, hyperlipidemia, STEMI 02/2014 s/p DES to RCA and repeat LHC with patent stents in 04/2014 and chronic chest pain since who presented to [facility] ED yesterday with chest pain.   Chest pain  -- Troponin x3 negative, ECG with no ST / TW changes  -- Cycle Troponin and serial ECGs  -- Continue ASA/Plavix, lopressor 12.5mg  BID. Myalgias with Lipitor 80. Trialed on simvastatin  -- No  further chest pain. Can likely go home today. MD to see.   HTN- well controlled. Continue lopressor 12.5 mg BID   HLD- intolerant to HD statin due to myalgias. PCP recently took her off Lipitor 80mg . Requests a low dose of simvastatin as her husband tolerates that. Tolerating simvastatin 10mg .   Right back pain- concerning for radiculopathy. Recommended following up with a orthopedic surgeon  Hypokalemia- mild at 3.6. Will give her 1x supplementation of 43mEq Kdur.  She has a previously scheduled follow up appt with Dr. Johnsie Cancel on 06/17/14.  Judy Pimple PA-C  Pager [phone]  The patient was seen, examined and discussed with Lorretta Harp, PA-C and I agree with the above.   A 73 year old female with known CAD, STEMI in 02/2014 s/p DES to RCA and repeat LHC with patent stents in 04/2014 and chronic chest pain since who presented to [facility] ED today with chest pain. The description of pain - back pain, neck pain, arm numbness, tingling, feeling better on work-out day, non-exertional, fit a picture of radiculopathy.  All 3 troponins were negative overnight and ECG was unchanged. We will add pravastatin 20 mg po daily and have CMP check prior to the next visit with Dr Johnsie Cancel. If she doesn't tolerate it she might be a candidate for PCSK-9 inhibitors. She will also have an echo at that time. We will continue ASA, plavix (SOB after Brillinta), carvedilol and lisinopril, BP soft, not much room for uptitration.  We will try to arrange her an early appointment with [facility] orthopedics.   Dorothy Spark 05/31/2014

## 2014-05-31 NOTE — Discharge Summary (Signed)
Sarah Ray 05/31/2014

## 2014-06-17 ENCOUNTER — Ambulatory Visit (HOSPITAL_COMMUNITY): Payer: Medicare Other | Attending: Cardiovascular Disease | Admitting: Radiology

## 2014-06-17 ENCOUNTER — Ambulatory Visit (INDEPENDENT_AMBULATORY_CARE_PROVIDER_SITE_OTHER): Payer: Medicare Other | Admitting: Cardiovascular Disease

## 2014-06-17 ENCOUNTER — Encounter: Payer: Self-pay | Admitting: Cardiovascular Disease

## 2014-06-17 ENCOUNTER — Ambulatory Visit (HOSPITAL_COMMUNITY)
Admission: RE | Admit: 2014-06-17 | Discharge: 2014-06-17 | Disposition: A | Discharge status: Home / Self Care | Payer: Medicare Other | Source: Ambulatory Visit | Attending: Cardiovascular Disease | Admitting: Cardiovascular Disease

## 2014-06-17 VITALS — BP 136/64 | HR 69 | Ht 61.5 in | Wt 126.1 lb

## 2014-06-17 DIAGNOSIS — I219 Acute myocardial infarction, unspecified: Secondary | ICD-10-CM | POA: Diagnosis not present

## 2014-06-17 DIAGNOSIS — Z7902 Long term (current) use of antithrombotics/antiplatelets: Secondary | ICD-10-CM | POA: Diagnosis present

## 2014-06-17 DIAGNOSIS — I10 Essential (primary) hypertension: Secondary | ICD-10-CM | POA: Diagnosis not present

## 2014-06-17 DIAGNOSIS — Z139 Encounter for screening, unspecified: Secondary | ICD-10-CM | POA: Diagnosis present

## 2014-06-17 DIAGNOSIS — I2119 ST elevation (STEMI) myocardial infarction involving other coronary artery of inferior wall: Secondary | ICD-10-CM

## 2014-06-17 DIAGNOSIS — E7800 Pure hypercholesterolemia, unspecified: Secondary | ICD-10-CM

## 2014-06-17 DIAGNOSIS — I252 Old myocardial infarction: Secondary | ICD-10-CM | POA: Insufficient documentation

## 2014-06-17 DIAGNOSIS — I2510 Atherosclerotic heart disease of native coronary artery without angina pectoris: Secondary | ICD-10-CM | POA: Diagnosis not present

## 2014-06-17 LAB — PLATELET INHIBITION P2Y12: PLATELET FUNCTION P2Y12: 91 [PRU] — AB (ref 194–418)

## 2014-06-17 NOTE — Assessment & Plan Note (Signed)
Well controlled.  Continue current medications and low sodium Dash type diet.   No need for ACE at this time with normal EF and BP so low

## 2014-06-17 NOTE — Assessment & Plan Note (Signed)
Cholesterol is at goal.  Continue current dose of statin and diet Rx.  No myalgias or side effects.  F/U  LFT's in 6 months. Lab Results  Component Value Date   LDLCALC 107* 05/31/2014

## 2014-06-17 NOTE — Patient Instructions (Signed)
Your physician recommends that you schedule a follow-up appointment in: 3 MONTHS WITH DR NISHAN Your physician recommends that you continue on your current medications as directed. Please refer to the Current Medication list given to you today. Your physician recommends that you return for lab work in: TODAY    P2Y  DX  V58.69 

## 2014-06-17 NOTE — Assessment & Plan Note (Signed)
No angina repeat cath stent patent  Allergic to ASA  On plavix with protonix  Check P2Y today

## 2014-06-17 NOTE — Progress Notes (Signed)
Echocardiogram performed.  

## 2014-06-17 NOTE — Progress Notes (Signed)
Patient ID: Sarah Ray, female   DOB: 1941-04-01, 73 y.o.   MRN: [id number] 73 yo seen by Dr Burt Knack 03/18/14 as acute inferior wall MI. I take care of her husband [name] and she wanted to f/u with me. 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  Has not taken lisinopril  Reviewed echo from today  EF normal 55%  Trivial MR  Inferior wall motion normal    ROS: Denies fever, malais, weight loss, blurry vision, decreased visual acuity, cough, sputum, SOB, hemoptysis, pleuritic pain, palpitaitons, heartburn, abdominal pain, melena, lower extremity edema, claudication, or rash.  All other systems reviewed and negative  General: Affect appropriate Healthy:  appears stated age 84: normal Neck supple with no adenopathy JVP normal no bruits no thyromegaly Lungs clear with no wheezing and good diaphragmatic motion Heart:  S1/S2 no murmur, no rub, gallop or click PMI normal Abdomen: benighn, BS positve, no tenderness, no AAA no bruit.  No HSM or HJR Distal pulses intact with no bruits No edema Neuro non-focal Skin warm and dry No muscular weakness   Current Outpatient Prescriptions  Medication Sig Dispense Refill  . Calcium Carbonate-Vitamin D (CALCIUM 600 + D PO) Take 1 tablet by mouth 2 (two) times daily.       . clopidogrel (PLAVIX) 75 MG tablet Take 75 mg by mouth at bedtime.      . cyclobenzaprine (FLEXERIL) 5 MG tablet Take 2.5  mg by mouth 3 (three) times daily as needed for muscle spasms.      Marland Kitchen lisinopril (PRINIVIL,ZESTRIL) 2.5 MG tablet Take 1 tablet (2.5 mg total) by mouth daily.  30 tablet  3  . metoprolol tartrate (LOPRESSOR) 25 MG tablet Take 0.5 tablets (12.5 mg total) by mouth 2 (two) times daily.  30 tablet  6  . nitroGLYCERIN (NITROSTAT) 0.4 MG SL tablet Place 1 tablet (0.4 mg total) under the tongue every 5 (five) minutes x 3 doses as needed for chest pain.  25 tablet  4  . nystatin-triamcinolone ointment (MYCOLOG) Apply 1 application topically daily as needed (for rash).      . Omega-3 Fatty Acids (FISH OIL) 1000 MG CAPS Take 1 capsule by mouth 2 (two) times daily.       . pantoprazole (PROTONIX) 40 MG tablet Take 1 tablet (40 mg total) by mouth daily.  30 tablet  6  . pravastatin (PRAVACHOL) 20 MG tablet Take 1 tablet (20 mg total) by mouth daily.  30 tablet  3   No current facility-administered medications for this visit.    Allergies  Brilinta; Aspirin; Lipitor; and Other  Electrocardiogram:  SR rate 82  LAD nonspecific ST changes   Assessment and Plan

## 2014-07-05 ENCOUNTER — Other Ambulatory Visit: Payer: Self-pay

## 2014-07-16 ENCOUNTER — Telehealth: Payer: Self-pay | Admitting: Cardiovascular Disease

## 2014-07-16 NOTE — Telephone Encounter (Signed)
SPOKE  WITH PT  CONT TO  COMPLAIN WITH   BURNING  ACROSS CHEST  WITH RADIATION DOWN BOTH ARMS  WAS NOT ABLE TO SLEEP LAST NIGHT  NOT  SURE IF  IS  PINCHED NERVE IS  HAVIN  MRI NEXT  WEEK  TO  CHECK  THAT OUT  WANTS TO MAKE  SURE MEDS ARE NOT  CAUSING  WILL FORWARD TO  DR Johnsie Cancel  FOR REVIEW  CONT  IN   CARDIAC  REHAB WITH NO PROBLEMS ./

## 2014-07-16 NOTE — Telephone Encounter (Signed)
New message      Talk to Altha Harm about her medications.

## 2014-07-17 ENCOUNTER — Ambulatory Visit: Payer: Medicare Other | Admitting: Cardiovascular Disease

## 2014-07-17 NOTE — Telephone Encounter (Signed)
Doubt this her heart f/u with neck issues and mri

## 2014-07-22 ENCOUNTER — Telehealth: Payer: Self-pay | Admitting: *Deleted

## 2014-07-22 ENCOUNTER — Encounter: Payer: Self-pay | Admitting: Cardiovascular Disease

## 2014-07-22 NOTE — Telephone Encounter (Signed)
PT  NOTIFIED ./CY 

## 2014-07-23 NOTE — Telephone Encounter (Signed)
ERROR  SEE OTHER  PHONE NOTE .Adonis Housekeeper

## 2014-08-06 ENCOUNTER — Telehealth: Payer: Self-pay | Admitting: Cardiovascular Disease

## 2014-08-06 NOTE — Telephone Encounter (Signed)
SPOKE  WITH   PT  C/O  ARM BURNING  RADIATING TO CHEST   ALSO LEG  CRAMPS    PARTICIPATES IN REHAB  WITH OUT  ANY  PROBLEMS  INFORMED PT  TO HOLD   PRAVASTATIN FOR  1 MO    TO  SEE IF SYMPTOMS IMPROVE  IF   NO CHANGE  TO CALL BACK   WILL FORWARD TO DR  Johnsie Cancel FOR  REVIEW .Adonis Housekeeper

## 2014-08-06 NOTE — Telephone Encounter (Signed)
New  Problem   Pt want to know if she can have labs drawn b/c she is having problems with her medications. Please advise pt

## 2014-08-07 ENCOUNTER — Encounter: Payer: Self-pay | Admitting: Cardiovascular Disease

## 2014-08-08 ENCOUNTER — Telehealth: Payer: Self-pay | Admitting: Gastroenterology

## 2014-08-08 NOTE — Telephone Encounter (Signed)
Patient is scheduled for an appt with Dr. Fuller Plan on 08/12/14 11:00

## 2014-08-12 ENCOUNTER — Encounter: Payer: Self-pay | Admitting: Gastroenterology

## 2014-08-12 ENCOUNTER — Ambulatory Visit (INDEPENDENT_AMBULATORY_CARE_PROVIDER_SITE_OTHER): Payer: Medicare Other | Admitting: Gastroenterology

## 2014-08-12 VITALS — BP 130/64 | HR 64 | Ht 60.5 in | Wt 124.0 lb

## 2014-08-12 DIAGNOSIS — K2100 Gastro-esophageal reflux disease with esophagitis, without bleeding: Secondary | ICD-10-CM

## 2014-08-12 NOTE — Patient Instructions (Signed)
Resume your Protonix  Gastroesophageal Reflux Disease, Adult Gastroesophageal reflux disease (GERD) happens when acid from your stomach flows up into the esophagus. When acid comes in contact with the esophagus, the acid causes soreness (inflammation) in the esophagus. Over time, GERD may create small holes (ulcers) in the lining of the esophagus. CAUSES   Increased body weight. This puts pressure on the stomach, making acid rise from the stomach into the esophagus.  Smoking. This increases acid production in the stomach.  Drinking alcohol. This causes decreased pressure in the lower esophageal sphincter (valve or ring of muscle between the esophagus and stomach), allowing acid from the stomach into the esophagus.  Late evening meals and a full stomach. This increases pressure and acid production in the stomach.  A malformed lower esophageal sphincter. Sometimes, no cause is found. SYMPTOMS   Burning pain in the lower part of the mid-chest behind the breastbone and in the mid-stomach area. This may occur twice a week or more often.  Trouble swallowing.  Sore throat.  Dry cough.  Asthma-like symptoms including chest tightness, shortness of breath, or wheezing. DIAGNOSIS  Your caregiver may be able to diagnose GERD based on your symptoms. In some cases, X-rays and other tests may be done to check for complications or to check the condition of your stomach and esophagus. TREATMENT  Your caregiver may recommend over-the-counter or prescription medicines to help decrease acid production. Ask your caregiver before starting or adding any new medicines.  HOME CARE INSTRUCTIONS   Change the factors that you can control. Ask your caregiver for guidance concerning weight loss, quitting smoking, and alcohol consumption.  Avoid foods and drinks that make your symptoms worse, such as:  Caffeine or alcoholic drinks.  Chocolate.  Peppermint or mint flavorings.  Garlic and onions.  Spicy  foods.  Citrus fruits, such as oranges, lemons, or limes.  Tomato-based foods such as sauce, chili, salsa, and pizza.  Fried and fatty foods.  Avoid lying down for the 3 hours prior to your bedtime or prior to taking a nap.  Eat small, frequent meals instead of large meals.  Wear loose-fitting clothing. Do not wear anything tight around your waist that causes pressure on your stomach.  Raise the head of your bed 6 to 8 inches with wood blocks to help you sleep. Extra pillows will not help.  Only take over-the-counter or prescription medicines for pain, discomfort, or fever as directed by your caregiver.  Do not take aspirin, ibuprofen, or other nonsteroidal anti-inflammatory drugs (NSAIDs). SEEK IMMEDIATE MEDICAL CARE IF:   You have pain in your arms, neck, jaw, teeth, or back.  Your pain increases or changes in intensity or duration.  You develop nausea, vomiting, or sweating (diaphoresis).  You develop shortness of breath, or you faint.  Your vomit is green, yellow, black, or looks like coffee grounds or blood.  Your stool is red, bloody, or black. These symptoms could be signs of other problems, such as heart disease, gastric bleeding, or esophageal bleeding. MAKE SURE YOU:   Understand these instructions.  Will watch your condition.  Will get help right away if you are not doing well or get worse. Document Released: 06/16/2005 Document Revised: 11/29/2011 Document Reviewed: 03/26/2011 [facility] Patient Information 2015 [place], [place]. This information is not intended to replace advice given to you by your health care provider. Make sure you discuss any questions you have with your health care provider.   Food Choices for Gastroesophageal Reflux Disease When you have gastroesophageal  reflux disease (GERD), the foods you eat and your eating habits are very important. Choosing the right foods can help ease the discomfort of GERD. WHAT GENERAL GUIDELINES DO I NEED TO  FOLLOW?  Choose fruits, vegetables, whole grains, low-fat dairy products, and low-fat meat, fish, and poultry.  Limit fats such as oils, salad dressings, butter, nuts, and avocado.  Keep a food diary to identify foods that cause symptoms.  Avoid foods that cause reflux. These may be different for different people.  Eat frequent small meals instead of three large meals each day.  Eat your meals slowly, in a relaxed setting.  Limit fried foods.  Cook foods using methods other than frying.  Avoid drinking alcohol.  Avoid drinking large amounts of liquids with your meals.  Avoid bending over or lying down until 2-3 hours after eating. WHAT FOODS ARE NOT RECOMMENDED? The following are some foods and drinks that may worsen your symptoms: Vegetables Tomatoes. Tomato juice. Tomato and spaghetti sauce. Chili peppers. Onion and garlic. Horseradish. Fruits Oranges, grapefruit, and lemon (fruit and juice). Meats High-fat meats, fish, and poultry. This includes hot dogs, ribs, ham, sausage, salami, and bacon. Dairy Whole milk and chocolate milk. Sour cream. Cream. Butter. Ice cream. Cream cheese.  Beverages Coffee and tea, with or without caffeine. Carbonated beverages or energy drinks. Condiments Hot sauce. Barbecue sauce.  Sweets/Desserts Chocolate and cocoa. Donuts. Peppermint and spearmint. Fats and Oils High-fat foods, including [place] fries and potato chips. Other Vinegar. Strong spices, such as black pepper, white pepper, red pepper, cayenne, curry powder, cloves, ginger, and chili powder. The items listed above may not be a complete list of foods and beverages to avoid. Contact your dietitian for more information. Document Released: 09/06/2005 Document Revised: 09/11/2013 Document Reviewed: 07/11/2013 [facility] Patient Information 2015 [place], [place]. This information is not intended to replace advice given to you by your health care provider. Make sure you discuss any  questions you have with your health care provider.

## 2014-08-12 NOTE — Progress Notes (Signed)
    History of Present Illness: This is a 73 year old female with a history of LA Grade C erosive esophagitis and an esophageal stricture. She has had an acute inferior wall MI in June with a 100% RCA occlusion. Recently she has had intermittent burning pain in her chest with leg cramps. Symptoms appeared to resolve after this discontinuing her statin drug. In addition she may have cervical disc disease leading to upper chest and shoulder symptoms. She discontinued Protonix thinking that may have been causing some of her symptoms however reflux symptoms have worsened.   Medications, Allergies, Past Medical History, Past Surgical History, Family History and Social History were reviewed in [employer] record.  Physical Exam: General: Well developed , well nourished, no acute distress Head: Normocephalic and atraumatic Eyes:  sclerae anicteric, EOMI Ears: Normal auditory acuity Mouth: No deformity or lesions Lungs: Clear throughout to auscultation Heart: Regular rate and rhythm; no murmurs, rubs or bruits Abdomen: Soft, non tender and non distended. No masses, hepatosplenomegaly or hernias noted. Normal Bowel sounds Musculoskeletal: Symmetrical with no gross deformities  Pulses:  Normal pulses noted Extremities: No clubbing, cyanosis, edema or deformities noted Neurological: Alert oriented x 4, grossly nonfocal Psychological:  Alert and cooperative. Normal mood and affect  Assessment and Recommendations:   1. GERD with a history of LA Grade C erosive esophagitis and an esophageal stricture. Follow all standard antireflux measures. Resume Protonix 40 mg daily for long-term use. She is reassured that her recent symptoms are not likely to be related to Protonix side effects.

## 2014-08-16 ENCOUNTER — Other Ambulatory Visit: Payer: Self-pay | Admitting: Cardiovascular Disease

## 2014-08-19 ENCOUNTER — Other Ambulatory Visit: Payer: Self-pay

## 2014-08-22 ENCOUNTER — Telehealth: Payer: Self-pay | Admitting: Gastroenterology

## 2014-08-22 MED ORDER — ESOMEPRAZOLE MAGNESIUM 40 MG PO CPDR: 40.0000 mg | DELAYED_RELEASE_CAPSULE | Freq: Every day | ORAL | Status: DC

## 2014-08-22 NOTE — Telephone Encounter (Signed)
Patient reports that all symptoms resumed with leg cramps and arm and chest heaviness after resuming protonix.  She stopped it again yesterday.  She wants to know what else she can take?

## 2014-08-22 NOTE — Telephone Encounter (Signed)
Patient notified rx sent 

## 2014-08-22 NOTE — Telephone Encounter (Signed)
Please try Nexium 40 mg, omeprazole 40 mg or any other PPI that she wants to try

## 2014-08-23 ENCOUNTER — Telehealth: Payer: Self-pay | Admitting: Gastroenterology

## 2014-08-23 NOTE — Telephone Encounter (Signed)
Explained to patient that typically, omeprazole is one of the cheaper PPI's to try. However, it appears she has been on pantoprazole and omeprazole previously and experience side effects/inadequate control of reflux. Unfortunately, there are not a lot of "cheap" PPI's out there. I asked her to contact her insurance company to find out if there are any other PPI's that are cheaper on her plan. She states that she has already contacted her insurance company. She states that she will try the Nexium.

## 2014-08-26 ENCOUNTER — Other Ambulatory Visit: Payer: Self-pay | Admitting: Dermatology

## 2014-08-29 ENCOUNTER — Encounter (HOSPITAL_COMMUNITY): Payer: Self-pay | Admitting: Cardiovascular Disease

## 2014-09-10 ENCOUNTER — Encounter: Payer: Self-pay | Admitting: *Deleted

## 2014-09-12 NOTE — Progress Notes (Signed)
Patient ID: Sarah Ray, female   DOB: [birth year], 73 y.o.   MRN: [id number] 73 yo seen by Dr Burt Knack 03/18/14 as acute inferior wall MI. I take care of her husband [name] and she wanted to f/u with me. 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 Has not taken lisinopril  Reviewed echo from9/28/15   EF normal 55% Trivial MR Inferior wall motion normal   Seen by Dr Fuller Plan 11/15 with mild erosive esophagitis and stricture Rx protonix   9/15  P2Y 91 showing good platlet inhibition on plavix   Reviewed labs from primary 11/15  LDL 111  Hct 37 Cr normal  Needs repeat lipids off statin Myalgias with lipitor  May need new script for lovastatin   ROS: Denies fever, malais, weight loss, blurry vision, decreased visual acuity, cough, sputum, SOB, hemoptysis, pleuritic pain, palpitaitons, heartburn, abdominal pain, melena, lower extremity edema, claudication, or rash.  All other systems reviewed and negative  General: Affect appropriate Healthy:  appears stated age 14: normal Neck supple with no adenopathy JVP normal no bruits no thyromegaly Lungs clear with no wheezing and good diaphragmatic motion Heart:  S1/S2 no murmur, no rub, gallop or click PMI normal Abdomen: benighn, BS positve, no tenderness, no AAA no bruit.  No HSM or HJR Distal pulses intact with no bruits No edema Neuro non-focal Skin warm and dry No muscular  weakness   Current Outpatient Prescriptions  Medication Sig Dispense Refill  . clopidogrel (PLAVIX) 75 MG tablet Take 75 mg by mouth at bedtime.    . methocarbamol (ROBAXIN) 500 MG tablet Take 500 mg by mouth as needed. Pt taking 1/2 tablet, as needed for muscle spasms  0  . metoprolol tartrate (LOPRESSOR) 25 MG tablet Take 0.5 tablets (12.5 mg total) by mouth 2 (two) times daily. 30 tablet 6  . nitroGLYCERIN (NITROSTAT) 0.4 MG SL tablet Place 1 tablet (0.4 mg total) under the tongue every 5 (five) minutes x 3 doses as needed for chest pain. 25 tablet 4  . nystatin-triamcinolone ointment (MYCOLOG) Apply 1 application topically daily as needed (for rash).    . pantoprazole (PROTONIX) 40 MG tablet Take 1 tablet by mouth daily.  2   No current facility-administered medications for this visit.    Allergies  Brilinta; Aspirin; Lipitor; and Other  Electrocardiogram: 06/17/14   SR rate 82 LAD nonspecific ST changes  Today SR rate 59 normal   Assessment and Plan

## 2014-09-16 ENCOUNTER — Ambulatory Visit (INDEPENDENT_AMBULATORY_CARE_PROVIDER_SITE_OTHER): Payer: Medicare Other | Admitting: Cardiovascular Disease

## 2014-09-16 ENCOUNTER — Encounter: Payer: Self-pay | Admitting: Cardiovascular Disease

## 2014-09-16 VITALS — BP 130/86 | HR 59 | Ht 60.5 in | Wt 125.0 lb

## 2014-09-16 DIAGNOSIS — R079 Chest pain, unspecified: Secondary | ICD-10-CM

## 2014-09-16 MED ORDER — METOPROLOL TARTRATE 25 MG PO TABS: 12.5000 mg | ORAL_TABLET | Freq: Two times a day (BID) | ORAL | Status: DC

## 2014-09-16 NOTE — Patient Instructions (Signed)
Your physician wants you to follow-up in: 6 months with Dr. Nishan. You will receive a reminder letter in the mail two months in advance. If you don't receive a letter, please call our office to schedule the follow-up appointment.  Your physician recommends that you continue on your current medications as directed. Please refer to the Current Medication list given to you today.  

## 2014-09-16 NOTE — Assessment & Plan Note (Signed)
Labs with primary been off lipitor for 6 weeks  If high start lovostatin 20 mg

## 2014-09-16 NOTE — Assessment & Plan Note (Signed)
Stable with no angina and good activity level.  Continue medical Rx  

## 2014-09-16 NOTE — Assessment & Plan Note (Signed)
Back on protonix as less interaction with plavix than prilosec

## 2014-09-19 ENCOUNTER — Telehealth: Payer: Self-pay | Admitting: Cardiovascular Disease

## 2014-09-19 NOTE — Telephone Encounter (Signed)
New Msg       Pt will be having surgery to remove basil cells with dermatologist on 10/07/14 and 10/15/14. Pt has a stint.  Does she need to begin an antibiotic? Please return call

## 2014-09-19 NOTE — Telephone Encounter (Signed)
Calling stating she forgot to ask Dr. Johnsie Cancel when seen on 12/28 that she was having removal of a basil cell removed on 1/18 and 1/26 and wanted to know if she should take an antibiotic.  Advised Dr. Johnsie Cancel not in office today and will forward message to him for his recommendations and someone will call her with his advise.

## 2014-09-20 NOTE — Telephone Encounter (Signed)
No need to take antibiotics make sure she does not stop plavix and they know she's on it as she may bleed more

## 2014-09-21 ENCOUNTER — Other Ambulatory Visit: Payer: Self-pay | Admitting: Cardiovascular Disease

## 2014-09-23 ENCOUNTER — Other Ambulatory Visit: Payer: Self-pay | Admitting: *Deleted

## 2014-09-23 MED ORDER — CLOPIDOGREL BISULFATE 75 MG PO TABS: 75.0000 mg | ORAL_TABLET | Freq: Every day | ORAL | Status: DC

## 2014-09-24 NOTE — Telephone Encounter (Signed)
PT  NOTIFIED ./CY 

## 2014-09-30 ENCOUNTER — Other Ambulatory Visit: Payer: Self-pay

## 2014-09-30 ENCOUNTER — Telehealth: Payer: Self-pay | Admitting: Cardiovascular Disease

## 2014-09-30 DIAGNOSIS — E785 Hyperlipidemia, unspecified: Secondary | ICD-10-CM

## 2014-09-30 NOTE — Telephone Encounter (Signed)
Order for lipid profile entered in epic.

## 2014-09-30 NOTE — Telephone Encounter (Signed)
New message      Pt want to have her cholesterol checked tomorrow but there is no order in the computer.  She told Dr Johnsie Cancel she was going to have it at her PCP office but they will not do it.  Please put order in computer so that she can have it done.  If there is a problem, please call

## 2014-10-01 ENCOUNTER — Other Ambulatory Visit (INDEPENDENT_AMBULATORY_CARE_PROVIDER_SITE_OTHER): Payer: Medicare Other | Admitting: *Deleted

## 2014-10-01 DIAGNOSIS — E785 Hyperlipidemia, unspecified: Secondary | ICD-10-CM

## 2014-10-01 LAB — COMPREHENSIVE METABOLIC PANEL
ALK PHOS: 69 U/L (ref 39–117)
ALT: 21 U/L (ref 0–35)
AST: 22 U/L (ref 0–37)
Albumin: 4.1 g/dL (ref 3.5–5.2)
BUN: 8 mg/dL (ref 6–23)
CALCIUM: 9.3 mg/dL (ref 8.4–10.5)
CHLORIDE: 105 meq/L (ref 96–112)
CO2: 27 mEq/L (ref 19–32)
Creatinine, Ser: 0.8 mg/dL (ref 0.4–1.2)
GFR: 72.15 mL/min (ref >60.00)
GLUCOSE: 85 mg/dL (ref 70–99)
Potassium: 3.9 mEq/L (ref 3.5–5.1)
Sodium: 137 mEq/L (ref 135–145)
Total Bilirubin: 1.3 mg/dL — ABNORMAL HIGH (ref 0.2–1.2)
Total Protein: 6.9 g/dL (ref 6.0–8.3)

## 2014-10-01 LAB — LIPID PANEL
CHOLESTEROL: 262 mg/dL — AB (ref 0–200)
HDL: 40.5 mg/dL (ref >39.00)
LDL Cholesterol: 194 mg/dL — ABNORMAL HIGH (ref 0–99)
NONHDL: 221.5
Total CHOL/HDL Ratio: 6
Triglycerides: 137 mg/dL (ref 0.0–149.0)
VLDL: 27.4 mg/dL (ref 0.0–40.0)

## 2014-10-07 ENCOUNTER — Telehealth: Payer: Self-pay | Admitting: *Deleted

## 2014-10-07 MED ORDER — SIMVASTATIN 20 MG PO TABS: 20.0000 mg | ORAL_TABLET | Freq: Every day | ORAL | Status: DC

## 2014-10-07 NOTE — Telephone Encounter (Signed)
I spoke with Sarah Ray about lab results & sent prescription in for simvastatin 20mg  as per Dr. Johnsie Cancel.  States she has heaviness in her chest & through to her back as well as heaviness in her legs. States she has taken NTG without relief.  Sarah Ray states she has a pinched nerve & physical therapy helped some. She is going to a chiropractor to help with C-spine pinched nerve.  Sarah Ray without discomfort at present.  She will call back if she is/ or unable to tolerate the Simvastatin. Understands will need repeat labs in 3 months if tolerated. Horton Chin RN

## 2014-10-07 NOTE — Telephone Encounter (Signed)
-----   Message from [name], MD sent at 10/07/2014  8:00 AM EST ----- Chol very high Myalgias wit lipitor  Call in simvastatin 20 mg and repeat labs in 3 months

## 2014-10-15 ENCOUNTER — Other Ambulatory Visit: Payer: Self-pay | Admitting: Dermatology

## 2014-11-01 ENCOUNTER — Telehealth: Payer: Self-pay | Admitting: Gastroenterology

## 2014-11-01 MED ORDER — PANTOPRAZOLE SODIUM 40 MG PO TBEC: 40.0000 mg | DELAYED_RELEASE_TABLET | Freq: Every day | ORAL | Status: DC

## 2014-11-01 NOTE — Telephone Encounter (Signed)
Patient states she never picked up the Nexium. She states she had some protonix left over and started taking it again and would like a new rx sent to her pharmacy. Prescription sent for Protonix.

## 2014-11-18 ENCOUNTER — Other Ambulatory Visit: Payer: Self-pay | Admitting: *Deleted

## 2014-11-18 DIAGNOSIS — E785 Hyperlipidemia, unspecified: Secondary | ICD-10-CM

## 2014-11-18 DIAGNOSIS — Z79899 Other long term (current) drug therapy: Secondary | ICD-10-CM

## 2014-12-06 ENCOUNTER — Telehealth: Payer: Self-pay | Admitting: Cardiovascular Disease

## 2014-12-06 NOTE — Telephone Encounter (Signed)
New Message  Pt called says that she can hear her heart beat in her left ear and she is highly concerned. Requests to speak with a nurse.

## 2014-12-06 NOTE — Telephone Encounter (Signed)
Spoke with pt. She reports hearing heart beat in her left ear for the last 3 days.  Describes as whooshing sound.  Has had problems with this in past when had wax build up in ear.  Saw ear doctor today and only had little wax in ear.  Sound in ear is not constant.  She is not having any chest pain or shortness of breath.  Has clear nasal drainage.  No cough, fever or other cold symptoms.  Husband has pneumonia.  Heartbeat is regular.  She is concerned about hearing heart beat in her ear and would like Dr. Kyla Balzarine advice on this.

## 2014-12-08 NOTE — Telephone Encounter (Signed)
Not a heart issue is likely related to eustachian tube and ear.  Can try cold medicine/ decongestants

## 2014-12-09 NOTE — Telephone Encounter (Signed)
Called patient to check on her status. She stated that she continued to hear her "heart" in her ear so she called 911 on Friday night. EMS came out and did an EKG which was "okay" and checked her BP which was high at 185/90. She took a muscle relaxer and her BP came down. She stated that once her BP came down, she no longer heard the "whoosh heart sound in my ear". She went to Cardiac Rehab already this morning and said that she feels better right now. She has an appointment with the [facility] today. They will be discussing the possibility of injections for her back pain. She said that previously the Orthopedist felt her being on blood thinners may prevent her from doing the injections, however she wants to discuss it some more before ruling it out.

## 2014-12-30 ENCOUNTER — Other Ambulatory Visit (INDEPENDENT_AMBULATORY_CARE_PROVIDER_SITE_OTHER): Payer: Medicare Other | Admitting: *Deleted

## 2014-12-30 DIAGNOSIS — E785 Hyperlipidemia, unspecified: Secondary | ICD-10-CM | POA: Diagnosis not present

## 2014-12-30 DIAGNOSIS — Z79899 Other long term (current) drug therapy: Secondary | ICD-10-CM

## 2014-12-30 LAB — LIPID PANEL
CHOL/HDL RATIO: 3
Cholesterol: 160 mg/dL (ref 0–200)
HDL: 47.9 mg/dL (ref >39.00)
LDL Cholesterol: 99 mg/dL (ref 0–99)
NonHDL: 112.1
Triglycerides: 64 mg/dL (ref 0.0–149.0)
VLDL: 12.8 mg/dL (ref 0.0–40.0)

## 2014-12-30 LAB — HEPATIC FUNCTION PANEL
ALBUMIN: 3.8 g/dL (ref 3.5–5.2)
ALK PHOS: 69 U/L (ref 39–117)
ALT: 18 U/L (ref 0–35)
AST: 19 U/L (ref 0–37)
BILIRUBIN DIRECT: 0.1 mg/dL (ref 0.0–0.3)
Total Bilirubin: 0.4 mg/dL (ref 0.2–1.2)
Total Protein: 6.4 g/dL (ref 6.0–8.3)

## 2015-01-13 ENCOUNTER — Other Ambulatory Visit: Payer: Self-pay | Admitting: *Deleted

## 2015-01-13 MED ORDER — CLOPIDOGREL BISULFATE 75 MG PO TABS: 75.0000 mg | ORAL_TABLET | Freq: Every day | ORAL | Status: DC

## 2015-01-31 NOTE — Progress Notes (Signed)
Patient ID: Sarah Ray, female   DOB: [birth year], 74 y.o.   MRN: [id number] 74 y.o.  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 I take care of her husband Sarah Ray and she wanted to f/u with me. 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 about 1.5 hrs. 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 Has not taken lisinopril  Reviewed echo from9/28/15   EF normal 55% Trivial MR Inferior wall motion normal   Seen by Dr Fuller Plan 11/15 with mild erosive esophagitis and stricture Rx protonix   9/15  P2Y 91 showing good platlet inhibition on plavix   Reviewed labs from primary 12/30/14  LDL 99  Hct 37 Cr normal   Tends to hear temporal pulse when BP high  Seeing orthopedist for possible back injections  Still with SSCP radiating down arms Like previous MI  Except no indigestion Exercise helps pain and nitro makes no difference   ROS: Denies fever, malais, weight loss, blurry vision, decreased visual acuity, cough, sputum, SOB, hemoptysis, pleuritic pain, palpitaitons, heartburn, abdominal pain, melena, lower extremity edema, claudication, or rash.  All other systems reviewed and negative  General: Affect appropriate Healthy:  appears stated age 68: normal Neck supple with no adenopathy JVP normal no bruits no thyromegaly Lungs clear with no wheezing and good diaphragmatic motion Heart:  S1/S2 no murmur, no rub, gallop or click PMI normal Abdomen: benighn, BS positve, no  tenderness, no AAA no bruit.  No HSM or HJR Distal pulses intact with no bruits No edema Neuro non-focal Skin warm and dry No muscular weakness   Current Outpatient Prescriptions  Medication Sig Dispense Refill  . clopidogrel (PLAVIX) 75 MG tablet Take 1 tablet (75 mg total) by mouth at bedtime. 30 tablet 6  . methocarbamol (ROBAXIN) 500 MG tablet Take 500 mg by mouth as needed. Pt taking 1/2 tablet, as needed for muscle spasms  0  . metoprolol tartrate (LOPRESSOR) 25 MG tablet Take 0.5 tablets (12.5 mg total) by mouth 2 (two) times daily. 30 tablet 6  . nitroGLYCERIN (NITROSTAT) 0.4 MG SL tablet Place 1 tablet (0.4 mg total) under the tongue every 5 (five) minutes x 3 doses as needed for chest pain. 25 tablet 4  . nystatin-triamcinolone ointment (MYCOLOG) Apply 1 application topically daily as needed (for rash).    . pantoprazole (PROTONIX) 40 MG tablet Take 1 tablet (40 mg total) by mouth daily. 30 tablet 5  . simvastatin (ZOCOR) 20 MG tablet Take 1 tablet (20 mg total) by mouth at bedtime. 30 tablet 6   No current facility-administered medications for this visit.    Allergies  Brilinta; Aspirin; Lipitor; and Other  Electrocardiogram: 06/17/14   SR rate 82 LAD nonspecific ST changes  06/17/14   SR rate 59 normal   Assessment and Plan CAD:  Recurrent symptoms not typical for angina with improvement exercise and no change in nitro 03/18/14 RCA stent with f/u cath  04/22/14 no restenosis  Will change beta blocker to coreg and do exercise myovue  Brillinta changed to plavix with good P2Y due to cost   Chol:  On zocor labs with primary

## 2015-02-03 ENCOUNTER — Ambulatory Visit (INDEPENDENT_AMBULATORY_CARE_PROVIDER_SITE_OTHER): Payer: Medicare Other | Admitting: Cardiovascular Disease

## 2015-02-03 ENCOUNTER — Encounter: Payer: Self-pay | Admitting: Cardiovascular Disease

## 2015-02-03 VITALS — BP 142/80 | HR 60 | Ht 61.0 in | Wt 125.0 lb

## 2015-02-03 DIAGNOSIS — R0789 Other chest pain: Secondary | ICD-10-CM

## 2015-02-03 MED ORDER — CARVEDILOL 3.125 MG PO TABS: 3.1250 mg | ORAL_TABLET | Freq: Two times a day (BID) | ORAL | Status: DC

## 2015-02-03 NOTE — Patient Instructions (Signed)
Medication Instructions:  Your physician has recommended you make the following change in your medication:  STOP METOPROLOL START  CARVEDILOL  3.125 MG  TWICE DAILY  Labwork: NONE  Testing/Procedures: Your physician has requested that you have en exercise stress myoview. For further information please visit [email or web address]. Please follow instruction sheet, as given.   Follow-Up: Your physician recommends that you schedule a follow-up appointment in: [place] Any Other Special Instructions Will Be Listed Below (If Applicable).

## 2015-02-11 ENCOUNTER — Emergency Department (HOSPITAL_COMMUNITY): Payer: Medicare Other

## 2015-02-11 ENCOUNTER — Encounter (HOSPITAL_COMMUNITY): Payer: Self-pay | Admitting: Physical Medicine and Rehabilitation

## 2015-02-11 ENCOUNTER — Emergency Department (HOSPITAL_COMMUNITY)
Admission: EM | Admit: 2015-02-11 | Discharge: 2015-02-11 | Disposition: A | Discharge status: Home / Self Care | Payer: Medicare Other | Attending: Emergency Medicine | Admitting: Emergency Medicine

## 2015-02-11 ENCOUNTER — Telehealth: Payer: Self-pay | Admitting: Cardiovascular Disease

## 2015-02-11 DIAGNOSIS — Z79899 Other long term (current) drug therapy: Secondary | ICD-10-CM | POA: Insufficient documentation

## 2015-02-11 DIAGNOSIS — Z85828 Personal history of other malignant neoplasm of skin: Secondary | ICD-10-CM | POA: Diagnosis not present

## 2015-02-11 DIAGNOSIS — Z9861 Coronary angioplasty status: Secondary | ICD-10-CM | POA: Diagnosis not present

## 2015-02-11 DIAGNOSIS — K219 Gastro-esophageal reflux disease without esophagitis: Secondary | ICD-10-CM | POA: Insufficient documentation

## 2015-02-11 DIAGNOSIS — Z9889 Other specified postprocedural states: Secondary | ICD-10-CM | POA: Insufficient documentation

## 2015-02-11 DIAGNOSIS — R079 Chest pain, unspecified: Secondary | ICD-10-CM | POA: Diagnosis present

## 2015-02-11 DIAGNOSIS — E785 Hyperlipidemia, unspecified: Secondary | ICD-10-CM | POA: Diagnosis not present

## 2015-02-11 DIAGNOSIS — H9319 Tinnitus, unspecified ear: Secondary | ICD-10-CM | POA: Diagnosis not present

## 2015-02-11 DIAGNOSIS — I10 Essential (primary) hypertension: Secondary | ICD-10-CM | POA: Insufficient documentation

## 2015-02-11 DIAGNOSIS — Z7902 Long term (current) use of antithrombotics/antiplatelets: Secondary | ICD-10-CM | POA: Insufficient documentation

## 2015-02-11 DIAGNOSIS — I2510 Atherosclerotic heart disease of native coronary artery without angina pectoris: Secondary | ICD-10-CM | POA: Insufficient documentation

## 2015-02-11 DIAGNOSIS — R0789 Other chest pain: Secondary | ICD-10-CM | POA: Diagnosis not present

## 2015-02-11 DIAGNOSIS — I252 Old myocardial infarction: Secondary | ICD-10-CM | POA: Diagnosis not present

## 2015-02-11 DIAGNOSIS — Z86018 Personal history of other benign neoplasm: Secondary | ICD-10-CM | POA: Diagnosis not present

## 2015-02-11 DIAGNOSIS — Z8739 Personal history of other diseases of the musculoskeletal system and connective tissue: Secondary | ICD-10-CM | POA: Insufficient documentation

## 2015-02-11 LAB — COMPREHENSIVE METABOLIC PANEL
ALT: 20 U/L (ref 14–54)
AST: 23 U/L (ref 15–41)
Albumin: 4.1 g/dL (ref 3.5–5.0)
Alkaline Phosphatase: 79 U/L (ref 38–126)
Anion gap: 7 (ref 5–15)
BILIRUBIN TOTAL: 0.8 mg/dL (ref 0.3–1.2)
BUN: 7 mg/dL (ref 6–20)
CHLORIDE: 106 mmol/L (ref 101–111)
CO2: 27 mmol/L (ref 22–32)
CREATININE: 0.73 mg/dL (ref 0.44–1.00)
Calcium: 9.4 mg/dL (ref 8.9–10.3)
GFR calc non Af Amer: >60 mL/min (ref >60)
Glucose, Bld: 102 mg/dL — ABNORMAL HIGH (ref 65–99)
Potassium: 4 mmol/L (ref 3.5–5.1)
SODIUM: 140 mmol/L (ref 135–145)
TOTAL PROTEIN: 7 g/dL (ref 6.5–8.1)

## 2015-02-11 LAB — CBC WITH DIFFERENTIAL/PLATELET
Basophils Absolute: 0 10*3/uL (ref 0.0–0.1)
Basophils Relative: 0 % (ref 0–1)
EOS ABS: 0.1 10*3/uL (ref 0.0–0.7)
EOS PCT: 3 % (ref 0–5)
HEMATOCRIT: 40.4 % (ref 36.0–46.0)
Hemoglobin: 13.5 g/dL (ref 12.0–15.0)
Lymphocytes Relative: 16 % (ref 12–46)
Lymphs Abs: 0.8 10*3/uL (ref 0.7–4.0)
MCH: 32.5 pg (ref 26.0–34.0)
MCHC: 33.4 g/dL (ref 30.0–36.0)
MCV: 97.1 fL (ref 78.0–100.0)
MONOS PCT: 8 % (ref 3–12)
Monocytes Absolute: 0.4 10*3/uL (ref 0.1–1.0)
Neutro Abs: 3.4 10*3/uL (ref 1.7–7.7)
Neutrophils Relative %: 73 % (ref 43–77)
Platelets: 223 10*3/uL (ref 150–400)
RBC: 4.16 MIL/uL (ref 3.87–5.11)
RDW: 12.5 % (ref 11.5–15.5)
WBC: 4.7 10*3/uL (ref 4.0–10.5)

## 2015-02-11 LAB — I-STAT TROPONIN, ED: Troponin i, poc: 0 ng/mL (ref 0.00–0.08)

## 2015-02-11 LAB — TROPONIN I

## 2015-02-11 NOTE — Discharge Instructions (Signed)

## 2015-02-11 NOTE — Consult Note (Signed)
CARDIOLOGY CONSULT NOTE   Patient ID: Sarah Ray MRN: [id number] DOB/AGE: 76-Oct-1942 76 y.o.  Admit date: 02/11/2015  Primary Physician   Gilford Rile, MD Primary Cardiologist   Dr Johnsie Cancel Reason for Consultation   Chest pain  HPI:Sarah Ray is a 74 y.o. year old female with a history of GERD, hyperlipidemia, CAD s/p STEMI 03/18/2014 w/ DES to RCA; repeat LHC with patent stents in 04/2014 and chronic chest pain. The main symptoms she had with her MI or indigestion and diaphoresis.  Admitted 05/2014 w/ chest pain, ez neg MI, statin changed to Zocor due to myalgias, no further workup.   05/136/2016, seen by PN. Still with chest pain, improved with exercise, no response to nitro. No indigestion like with MI. BB changed from Lopressor 12.5 mg bid to Coreg 3.125 mg bid. GXT MV scheduled for 05/27.  Since being started on the carvedilol, she has not taken her muscle relaxant or nerve medication. The first time she took the carvedilol, she got hot all over shortly after taking it but had not eaten very much. Instead of taking it prior to going to cardiac rehabilitation, she now goes to cardiac rehabilitation, comes home, eats breakfast, and then takes her medication.  She did not have any discomfort upon waking this a.m. She went to cardiac rehabilitation and did well but her blood pressure was more elevated than usual. On the way home, she developed chest pressure, tingling that ran down from her neck all the way down her legs including both arms, and numbness. The tingling and numbness were in her face including around her mouth and her gums. The numbness was mainly in her face. The chest pressure did not change with deep inspiration. She denies shortness of breath, nausea or vomiting. She had no indigestion or diaphoresis.   She called the office and came to the emergency room as directed. She has several blood pressure readings, with a systolic blood pressure in the 130s-150s. This is  unusually high for her. At home, she finally took a nitroglycerin, which improved her blood pressure but did not change her pressure much. She came to the ER as directed. In the emergency room, her symptoms have improved although no medications are being given. Her blood pressure has also improved.transiently  Now back up    She also reports feeling her heartbeat in her left ear although there was no significant wax in her ears upon evaluation. Her blood pressure at that time was normal.   Past Medical History  Diagnosis Date  . Hiatal hernia   . Peptic stricture of esophagus   . GERD (gastroesophageal reflux disease)     LA Class Grade C Erosive esophagitis  . Hyperlipidemia   . Internal hemorrhoids   . External hemorrhoids   . Adenomatous polyps 07/1997  . Osteopenia 01/2014     T score -2.0. FRAX 19.8%/4.8% with statistically significant decrease at measured sites  . Breast fibroadenoma   . CAD (coronary artery disease) 03/18/14    STEMI- stent to RCA and nonobstructive disease LAD and LCX  . 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 Skin cancer 2014    "forehead"  . HTN (hypertension)      Past Surgical History  Procedure Laterality Date  . Thyroid cyst excision  1970's  . Bunionectomy Bilateral 04/04/2013  . Coronary angioplasty with stent placement  03/18/14    Xience DES to totally occl RCA,  residual non obstructive disease to LAD and LCX  . Tonsillectomy and adenoidectomy  ~ 1950  . Abdominal hysterectomy  1977    Leiomyoma  . Breast cyst aspiration Right ~ 2011  . Esophagogastroduodenoscopy (egd) with esophageal dilation  2000's X 1  . Skin cancer excision  2014  . Left heart cath N/A 03/18/2014    Procedure: LEFT HEART CATH;  Surgeon: Blane Ohara, MD;  Location: [facility] C[facility] LAB;  Service: Cardiovascular;  Laterality: N/A;  . Left heart catheterization with coronary angiogram N/A 04/22/2014    Procedure: LEFT  HEART CATHETERIZATION WITH CORONARY ANGIOGRAM;  Surgeon: Blane Ohara, MD;  Location: [facility] CATH LAB;  Service: Cardiovascular;  Laterality: N/A;    Allergies  Allergen Reactions  . Brilinta [Ticagrelor] Other (See Comments)    Caused chest pains  . Aspirin Hives, Diarrhea and Swelling    Benadryl stops reaction  . Lipitor [Atorvastatin] Other (See Comments)    Myalgias and leg heaviness  . Other Other (See Comments)    Very sensitive to all meds    I have reviewed the patient's current medications     Prior to Admission medications   Medication Sig Start Date End Date Taking? Authorizing Provider  carvedilol (COREG) 3.125 MG tablet Take 1 tablet (3.125 mg total) by mouth 2 (two) times daily. 02/03/15  Yes [name], MD  clopidogrel (PLAVIX) 75 MG tablet Take 1 tablet (75 mg total) by mouth at bedtime. 01/13/15  Yes [name], MD  methocarbamol (ROBAXIN) 500 MG tablet Take 500 mg by mouth as needed. Pt taking 1/2 tablet, as needed for muscle spasms 07/02/14  Yes Historical Provider, MD  nitroGLYCERIN (NITROSTAT) 0.4 MG SL tablet Place 1 tablet (0.4 mg total) under the tongue every 5 (five) minutes x 3 doses as needed for chest pain. 03/20/14  Yes Isaiah Serge, NP  pantoprazole (PROTONIX) 40 MG tablet Take 1 tablet (40 mg total) by mouth daily. 11/01/14  Yes Ladene Artist, MD  simvastatin (ZOCOR) 20 MG tablet Take 1 tablet (20 mg total) by mouth at bedtime. 10/07/14  Yes [name], MD  hydrOXYzine (VISTARIL) 25 MG capsule Take 25 mg by mouth as needed.    Historical Provider, MD  nystatin-triamcinolone ointment (MYCOLOG) Apply 1 application topically daily as needed (for rash).    Historical Provider, MD     History   Social History  . Marital Status: Married    Spouse Name: N/A  . Number of Children: 2  . Years of Education: N/A   Occupational History  . Dance movement psychotherapist    Social History Main Topics  . Smoking status: Never Smoker   . Smokeless tobacco:  Never Used  . Alcohol Use: No  . Drug Use: No  . Sexual Activity: Not Currently    Birth Control/ Protection: Surgical, Post-menopausal     Comment: HYST   Other Topics Concern  . Not on file   Social History Narrative   Daily caffeine  2 cups per day    Family Status  Relation Status Death Age  . Mother Deceased   . Father Deceased    Family History  Problem Relation Age of Onset  . Colon cancer Maternal Aunt   . Breast cancer Maternal Aunt     Age 30  . Colon polyps Mother   . Breast cancer Daughter     Age 79  . Diabetes Father   . Heart disease Brother  irregular heart beat     ROS:  Full 14 point review of systems complete and found to be negative unless listed above.  Physical Exam: Blood pressure 160/81, pulse 64, temperature 97.9 F (36.6 C), temperature source Oral, resp. rate 16, SpO2 100 %.  General: Well developed, well nourished, female in no acute distress Head: Eyes PERRLA, No xanthomas.   Normocephalic and atraumatic, oropharynx without edema or exudate. Dentition: Good Lungs: Clear bilaterally Heart: HRRR S1 S2, no rub/gallop, no murmur. pulses are 2+ all 4 extrem.   Neck: No carotid bruits. No lymphadenopathy.  JVD not elevated. Abdomen: Bowel sounds present, abdomen soft and non-tender without masses or hernias noted. Msk:  No spine or cva tenderness. No weakness, no joint deformities or effusions. Extremities: No clubbing or cyanosis. Minimal ankle edema.  Neuro: Alert and oriented X 3. No focal deficits noted. Psych:  Good affect, responds appropriately Skin: No rashes or lesions noted.  Labs:   Lab Results  Component Value Date   WBC 4.7 02/11/2015   HGB 13.5 02/11/2015   HCT 40.4 02/11/2015   MCV 97.1 02/11/2015   PLT 223 02/11/2015    Recent Labs Lab 02/11/15 1351  NA 140  K 4.0  CL 106  CO2 27  BUN 7  CREATININE 0.73  CALCIUM 9.4  PROT 7.0  BILITOT 0.8  ALKPHOS 79  ALT 20  AST 23  GLUCOSE 102*  ALBUMIN 4.1     Recent Labs  02/11/15 1400  TROPIPOC 0.00   Echo:  06/17/2014 Conclusions - Left ventricle: The cavity size was normal. Wall thickness was increased in a pattern of mild LVH. Systolic function was normal. The estimated ejection fraction was 55%. Wall motion was normal; there were no regional wall motion abnormalities. - Left atrium: The atrium was mildly dilated. - Atrial septum: No defect or patent foramen ovale was identified.  ECG:  02/11/2015 Sinus rhythm, ECG voltage a little lower than previous  Cardiac cath:  3.0x18 mm Xience DES to the RCA  04/22/2014 Left mainstem: Long segment, minor diffuse irregularity, but no significant disease Left anterior descending (LAD): Widely patent to the apex with patent diagonals. The proximal LAD has 20-30% stenosis. Left circumflex (LCx): Patent with 40% proximal stenosis. Supplies a single OM branch. Right coronary artery (RCA): Large, dominant vessel. Mid-vessel stent is widely patent without restenosis. The distal vessel before the bifurcation has 40-50% stenosis unchanged from the previous study. The PDA and PLA branches are patent.  Left ventriculography: Left ventricular systolic function is normal, LVEF is estimated at 55%, there is no significant mitral regurgitation   Radiology:  Dg Chest 2 View 02/11/2015   CLINICAL DATA:  Chest pain, pressure across chest.  EXAM: CHEST  2 VIEW  COMPARISON:  05/30/2014  FINDINGS: The heart size and mediastinal contours are within normal limits. Both lungs are clear. The visualized skeletal structures are unremarkable.  IMPRESSION: No active cardiopulmonary disease.   Electronically Signed   By: Rolm Baptise M.D.   On: 02/11/2015 14:53    ASSESSMENT AND PLAN:   The patient was seen today by Dr. Harrington Challenger, the patient evaluated and the data reviewed.  Principal Problem:   Chest pressure   - Symptoms were prolonged, continuous since before 8 AM today. - No change with sublingual  nitroglycerin, no symptoms reminiscent of her MI (she had indigestion then and diaphoresis) although she had the numbness and tingling with it.  Pt appears most anxious about numbness and tingling   -  ECG is not acute and initial troponin is negative  Would recheck troponin  If negative  Would send home  I would keep on same regimen  Plan for stress test later this week  I have given names for further PT eval in [place] SignedLenoard Aden 02/11/2015 5:35 PM Beeper [phone]  Patinet seen and examined  I have amended note by R Barrett above to reflect my findings.  If repeat trop neg ok to d/c  Keep on same mes. Dorris Carnes

## 2015-02-11 NOTE — Telephone Encounter (Signed)
Pt states she saw Dr Johnsie Cancel 02/03/15 and spoke with him about burning in face, arms, and legs, chest pressure.  Pt states metoprolol was changed to coreg hoping this would help her symptoms, stress test scheduled for this Friday.  Pt states BP today at Cardiac Rehab 138/89 before exercise, 135/80 HR 77 after exercise. Pt exercised at Cardiac Rehab today without symptoms.   Pt states on the way home from cardiac rehab she developed burning in her face,arms and legs, pressure in her chest.  Pt states when she got home she checked her BP 155/95 recheck BP was 170/107, pt states she still had burning in her face, arms, and legs, chest pressure, she took 1 NTG, pt states BP now 141/91.  Pt states she continues to have burning and weakness in both arms and both legs, chest pressure , NTG has not seemed to help symptoms.

## 2015-02-11 NOTE — Telephone Encounter (Signed)
New Message   Pt c/o medication issue:  1. Name of Medication: carvidolol  2. How are you currently taking this medication (dosage and times per day)? 1 tablet , 3.125 twice a day   3. Are you having a reaction (difficulty breathing--STAT)?burning in both arms and up to her face and some pressure in her chest   4. What is your medication issue? Blood pressure control

## 2015-02-11 NOTE — ED Notes (Signed)
Discharge instructions given to patient, voiced understanding

## 2015-02-11 NOTE — ED Notes (Signed)
Pt c/o tightness/ tingling in arms, across chest, back, has hx of MI in June '15-- goes to cardiac rehab daily-- pt started on Carvedolol on May 19th-- had "hot flushed feeling" after taking the first dose, and has not been feeling right since.

## 2015-02-11 NOTE — Telephone Encounter (Signed)
Pt states she is not having any other symptoms but chest pressure is similar to previous MI.  I reviewed with Richardson Dopp, PAc--he recommended pt report to [facility] ED for further evaluation.   Pt advised to report to [facility] ED for further evaluation, pt advised not to drive.

## 2015-02-11 NOTE — ED Provider Notes (Addendum)
CSN: [id number]     Arrival date & time 02/11/15  1337 History   First MD Initiated Contact with Patient 02/11/15 1530     Chief Complaint  Patient presents with  . Chest Pain     (Consider location/radiation/quality/duration/timing/severity/associated sxs/prior Treatment) HPI Comments: Patient denies any flushing of the skin when symptoms occur. She does not take niacin.  Patient is a 74 y.o. female presenting with chest pain. The history is provided by the patient.  Chest Pain Chest pain location: Central chest. Pain quality: burning and tightness   Pain radiates to:  R arm, L arm, L jaw and R jaw Pain radiates to the back: yes   Pain severity:  Moderate Onset quality:  Sudden Timing:  Constant Progression:  Waxing and waning Chronicity:  Recurrent Context: at rest   Relieved by: Usually better with exercise. Worsened by:  Nothing tried Ineffective treatments:  Rest and nitroglycerin Associated symptoms: lower extremity edema   Associated symptoms: no abdominal pain, no cough, no nausea, no palpitations, no shortness of breath and not vomiting   Associated symptoms comment:  Not associated with eating. Also recently changed her blood pressure medication approximately 1 week ago and now is noticing some mild swelling in her ankles Risk factors: coronary artery disease and hypertension   Risk factors: no smoking     Past Medical History  Diagnosis Date  . Hiatal hernia   . Peptic stricture of esophagus   . GERD (gastroesophageal reflux disease)     LA Class Grade C Erosive esophagitis  . Hyperlipidemia   . Internal hemorrhoids   . External hemorrhoids   . Adenomatous polyps 07/1997  . Osteopenia 01/2014     T score -2.0. FRAX 19.8%/4.8% with statistically significant decrease at measured sites  . Breast fibroadenoma   . CAD (coronary artery disease) 03/18/14    STEMI- stent to RCA and nonobstructive disease LAD and LCX  . ST elevation myocardial infarction (STEMI)  involving right coronary artery in recovery phase 03/18/14  . Skin cancer 2014    "forehead"  . HTN (hypertension)    Past Surgical History  Procedure Laterality Date  . Thyroid cyst excision  1970's  . Bunionectomy Bilateral 04/04/2013  . Coronary angioplasty with stent placement  03/18/14    Xience DES to totally occl RCA, residual non obstructive disease to LAD and LCX  . Tonsillectomy and adenoidectomy  ~ 1950  . Abdominal hysterectomy  1977    Leiomyoma  . Breast cyst aspiration Right ~ 2011  . Esophagogastroduodenoscopy (egd) with esophageal dilation  2000's X 1  . Skin cancer excision  2014  . Left heart cath N/A 03/18/2014    Procedure: LEFT HEART CATH;  Surgeon: Blane Ohara, MD;  Location[facility] CATH LAB;  Service: Cardiovascular;  Laterality: N/A;  . Left heart catheterization with coronary angiogram N/A 04/22/2014    Procedure: LEFT HEART CATHETERIZATION WITH CORONARY ANGIOGRAM;  Surgeon: Blane Ohara, MD;  Location: [facility] CATH LAB;  Service: Cardiovascular;  Laterality: N/A;   Family History  Problem Relation Age of Onset  . Colon cancer Maternal Aunt   . Breast cancer Maternal Aunt     Age 64  . Colon polyps Mother   . Breast cancer Daughter     Age 47  . Diabetes Father   . Heart disease Brother     irregular heart beat   History  Substance Use Topics  . Smoking status: Never Smoker   . Smokeless tobacco: Never  Used  . Alcohol Use: No   OB History    Gravida Para Term Preterm AB TAB SAB Ectopic Multiple Living   2 2 2       2      Review of Systems  HENT:       Tinnitis  Respiratory: Negative for cough and shortness of breath.   Cardiovascular: Positive for chest pain. Negative for palpitations.  Gastrointestinal: Negative for nausea, vomiting and abdominal pain.  All other systems reviewed and are negative.     Allergies  Brilinta; Aspirin; Lipitor; and Other  Home Medications   Prior to Admission medications   Medication Sig Start Date End  Date Taking? Authorizing Provider  carvedilol (COREG) 3.125 MG tablet Take 1 tablet (3.125 mg total) by mouth 2 (two) times daily. 02/03/15  Yes [name], MD  clopidogrel (PLAVIX) 75 MG tablet Take 1 tablet (75 mg total) by mouth at bedtime. 01/13/15  Yes [name], MD  methocarbamol (ROBAXIN) 500 MG tablet Take 500 mg by mouth as needed. Pt taking 1/2 tablet, as needed for muscle spasms 07/02/14  Yes Historical Provider, MD  nitroGLYCERIN (NITROSTAT) 0.4 MG SL tablet Place 1 tablet (0.4 mg total) under the tongue every 5 (five) minutes x 3 doses as needed for chest pain. 03/20/14  Yes Isaiah Serge, NP  pantoprazole (PROTONIX) 40 MG tablet Take 1 tablet (40 mg total) by mouth daily. 11/01/14  Yes Ladene Artist, MD  simvastatin (ZOCOR) 20 MG tablet Take 1 tablet (20 mg total) by mouth at bedtime. 10/07/14  Yes [name], MD  hydrOXYzine (VISTARIL) 25 MG capsule Take 25 mg by mouth as needed.    Historical Provider, MD  nystatin-triamcinolone ointment (MYCOLOG) Apply 1 application topically daily as needed (for rash).    Historical Provider, MD   BP 115/66 mmHg  Pulse 62  Temp(Src) 97.9 F (36.6 C) (Oral)  Resp 18  SpO2 96% Physical Exam  Constitutional: She is oriented to person, place, and time. She appears well-developed and well-nourished. No distress.  HENT:  Head: Normocephalic and atraumatic.  Mouth/Throat: Oropharynx is clear and moist.  Eyes: Conjunctivae and EOM are normal. Pupils are equal, round, and reactive to light.  Neck: Normal range of motion. Neck supple.  Cardiovascular: Normal rate, regular rhythm and intact distal pulses.   No murmur heard. Pulmonary/Chest: Effort normal and breath sounds normal. No respiratory distress. She has no wheezes. She has no rales.  Abdominal: Soft. She exhibits no distension. There is no tenderness. There is no rebound and no guarding.  Musculoskeletal: Normal range of motion. She exhibits no edema or tenderness.  Neurological:  She is alert and oriented to person, place, and time.  Skin: Skin is warm and dry. No rash noted. No erythema.  Psychiatric: She has a normal mood and affect. Her behavior is normal.  Nursing note and vitals reviewed.   ED Course  Procedures (including critical care time) Labs Review Labs Reviewed  COMPREHENSIVE METABOLIC PANEL - Abnormal; Notable for the following:    Glucose, Bld 102 (*)    All other components within normal limits  CBC WITH DIFFERENTIAL/PLATELET  Randolm Idol, ED    Imaging Review Dg Chest 2 View  02/11/2015   CLINICAL DATA:  Chest pain, pressure across chest.  EXAM: CHEST  2 VIEW  COMPARISON:  05/30/2014  FINDINGS: The heart size and mediastinal contours are within normal limits. Both lungs are clear. The visualized skeletal structures are unremarkable.  IMPRESSION:  No active cardiopulmonary disease.   Electronically Signed   By: Rolm Baptise M.D.   On: 02/11/2015 14:53     EKG Interpretation   Date/Time:  Tuesday Feb 11 2015 13:45:33 EDT Ventricular Rate:  63 PR Interval:  142 QRS Duration: 86 QT Interval:  410 QTC Calculation: 419 R Axis:   -5 Text Interpretation:  Normal sinus rhythm with sinus arrhythmia Low  voltage QRS No significant change since last tracing Confirmed by Maryan Rued   MD, Loree Fee ([id number]) on 02/11/2015 4:08:12 PM      MDM   Final diagnoses:  Atypical chest pain    Patient with a significant history for an inferior MI status post stent approximately one year ago who has had ongoing problems since that time with burning discomfort in her chest that radiates into her face arms and legs. She most recently saw cardiology last week and had a stress test ordered. Also her blood pressure medication was changed. Today the discomfort returned and she also noted high blood pressure. She states prior to that she's never had high blood pressure. She is concerned that some of this may be caused by the medication. Also noting some mild swelling  in her ankles since change of medication. She completed cardiac rehabilitation today without any difficulty. She denies any shortness of breath, nausea or vomiting. Symptoms did not start after eating. It does not start directly after taking her medication either. Denies any infectious symptoms. She is otherwise well-appearing.  Patient's EKG today is within normal limits. Troponin is normal. CBC and CMP are within normal limits. Chest x-ray on remarkable.  Discussed patient with cardiology to calm and evaluate. Low suspicion that patient's symptoms today are cardiac in nature.  7:31 PM Patient second troponin is negative. Will discharge home per cardiology requesting follow-up on Friday for her stress test.  Blanchie Dessert, MD 02/11/15 [place], MD 02/11/15 4037  Blanchie Dessert, MD 02/11/15 5436

## 2015-02-11 NOTE — ED Notes (Signed)
Pt states she began having L sided chest pain after cardiac rehab today. Radiates to L shoulder and arm. 2/10 pain upon arrival to ED. History of MI.

## 2015-02-14 ENCOUNTER — Ambulatory Visit (HOSPITAL_COMMUNITY): Payer: Medicare Other | Attending: Cardiology

## 2015-02-14 DIAGNOSIS — R0789 Other chest pain: Secondary | ICD-10-CM

## 2015-02-14 LAB — MYOCARDIAL PERFUSION IMAGING
CHL CUP MPHR: 145 {beats}/min
CHL CUP RESTING HR STRESS: 64 {beats}/min
CHL CUP STRESS STAGE 1 DBP: 96 mmHg
CHL CUP STRESS STAGE 1 GRADE: 0 %
CHL CUP STRESS STAGE 1 HR: 72 {beats}/min
CHL CUP STRESS STAGE 10 DBP: 76 mmHg
CHL CUP STRESS STAGE 2 DBP: 99 mmHg
CHL CUP STRESS STAGE 2 GRADE: 0 %
CHL CUP STRESS STAGE 2 SBP: 151 mmHg
CHL CUP STRESS STAGE 3 GRADE: 0 %
CHL CUP STRESS STAGE 5 GRADE: 10 %
CHL CUP STRESS STAGE 5 SBP: 155 mmHg
CHL CUP STRESS STAGE 6 DBP: 87 mmHg
CHL CUP STRESS STAGE 6 SBP: 181 mmHg
CHL CUP STRESS STAGE 6 SPEED: 2.5 mph
CHL CUP STRESS STAGE 7 HR: 148 {beats}/min
CHL CUP STRESS STAGE 8 SPEED: 3.4 mph
CHL CUP STRESS STAGE 9 DBP: 90 mmHg
CHL CUP STRESS STAGE 9 SPEED: 0 mph
CHL RATE OF PERCEIVED EXERTION: 15
Estimated workload: 10.1 METS
Exercise duration (min): 9 min
Exercise duration (sec): 0 s
LV dias vol: 77 mL
LVSYSVOL: 27 mL
NUC STRESS EF: 65 %
Peak HR: 148 {beats}/min
Percent HR: 102 %
Percent of predicted max HR: 102 %
RATE: 0.37
SDS: 2
SRS: 1
SSS: 3
Stage 1 SBP: 143 mmHg
Stage 1 Speed: 0 mph
Stage 10 Grade: 0 %
Stage 10 HR: 76 {beats}/min
Stage 10 SBP: 132 mmHg
Stage 10 Speed: 0 mph
Stage 2 HR: 75 {beats}/min
Stage 2 Speed: 0 mph
Stage 3 HR: 76 {beats}/min
Stage 3 Speed: 0.2 mph
Stage 4 Grade: 0.2 %
Stage 4 HR: 76 {beats}/min
Stage 4 Speed: 0.6 mph
Stage 5 DBP: 87 mmHg
Stage 5 HR: 100 {beats}/min
Stage 5 Speed: 1.7 mph
Stage 6 Grade: 12 %
Stage 6 HR: 123 {beats}/min
Stage 7 DBP: 92 mmHg
Stage 7 Grade: 14 %
Stage 7 SBP: 193 mmHg
Stage 7 Speed: 3.4 mph
Stage 8 Grade: 14 %
Stage 8 HR: 148 {beats}/min
Stage 9 Grade: 0 %
Stage 9 HR: 120 {beats}/min
Stage 9 SBP: 198 mmHg
TID: 1.03

## 2015-02-14 MED ORDER — TECHNETIUM TC 99M SESTAMIBI GENERIC - CARDIOLITE
11.0000 | Freq: Once | INTRAVENOUS | Status: AC | PRN
  Administered 2015-02-14: 11 via INTRAVENOUS

## 2015-02-14 MED ORDER — TECHNETIUM TC 99M SESTAMIBI GENERIC - CARDIOLITE
33.0000 | Freq: Once | INTRAVENOUS | Status: AC | PRN
  Administered 2015-02-14: 33 via INTRAVENOUS

## 2015-04-20 NOTE — Progress Notes (Signed)
Patient ID: Sarah Ray, female   DOB: 1941/03/28, 74 y.o.   MRN: [id number] 74 y.o.  seen by Sarah Ray 03/18/14 as acute inferior wall MI. I take care of her husband Sarah Ray and she wanted to f/u with me. 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 Had single vessel disease with mid RCA occlusion No left sided disease no left to right collaterals Excellent angiographic result EF in normal range with inferobasal akinesis only D/C 48 hrs fast track Has had angioedema with aspirin and taking brillinta as anti anginal Some bruising of right radial sight but no other issues   Readmitted cath 04/22/14    Sarah Ray with no restenosis Turned out to be cervical neck issue Brillinta caused dyspnea and now on plavix. Has not taken lisinopril  Reviewed echo from9/28/15   EF normal 55% Trivial MR Inferior wall motion normal   Seen by Sarah Ray 11/15 with mild erosive esophagitis and stricture Rx protonix   9/15  P2Y 91 showing good platlet inhibition on plavix    Tends to hear temporal pulse when BP high  Seeing orthopedist for possible back injections  Still with SSCP radiating down arms Like previous MI  Except no indigestion Exercise helps pain and nitro makes no difference  Seen in ER 02/11/15 with atypical pain r/o d/c.  F/U myovue done 02/14/15 reviewed and normal with no ischemia or infarct    ROS: Denies fever, malais, weight loss, blurry vision, decreased visual acuity, cough, sputum, SOB, hemoptysis, pleuritic pain, palpitaitons, heartburn, abdominal pain, melena, lower extremity edema, claudication, or rash.  All other systems reviewed and negative  General: Affect appropriate Healthy:  appears stated age 58: normal Neck supple with no adenopathy JVP normal no bruits no thyromegaly Lungs clear with no wheezing and good diaphragmatic motion Heart:  S1/S2 no murmur, no rub, gallop or  click PMI normal Abdomen: benighn, BS positve, no tenderness, no AAA no bruit.  No HSM or HJR Distal pulses intact with no bruits No edema Neuro non-focal Skin warm and dry No muscular weakness   Current Outpatient Prescriptions  Medication Sig Dispense Refill  . carvedilol (COREG) 3.125 MG tablet Take 1 tablet (3.125 mg total) by mouth 2 (two) times daily. 60 tablet 3  . clopidogrel (PLAVIX) 75 MG tablet Take 1 tablet (75 mg total) by mouth at bedtime. 30 tablet 6  . hydrOXYzine (VISTARIL) 25 MG capsule Take 25 mg by mouth as needed.    . methocarbamol (ROBAXIN) 500 MG tablet Take 500 mg by mouth as needed. Pt taking 1/2 tablet, as needed for muscle spasms  0  . nitroGLYCERIN (NITROSTAT) 0.4 MG SL tablet Place 1 tablet (0.4 mg total) under the tongue every 5 (five) minutes x 3 doses as needed for chest pain. 25 tablet 4  . nystatin-triamcinolone ointment (MYCOLOG) Apply 1 application topically daily as needed (for rash).    . pantoprazole (PROTONIX) 40 MG tablet Take 1 tablet (40 mg total) by mouth daily. 30 tablet 5  . simvastatin (ZOCOR) 20 MG tablet Take 1 tablet (20 mg total) by mouth at bedtime. 30 tablet 6   No current facility-administered medications for this visit.    Allergies  Brilinta; Aspirin; Lipitor; and Other  Electrocardiogram: 06/17/14   SR rate 82 LAD nonspecific ST changes  06/17/14   SR rate 59 normal   Assessment and Ray CAD:  Recurrent symptoms not typical for angina with improvement exercise and no change in nitro 03/18/14 RCA stent with f/u cath  04/22/14 no restenosis Normal myovue 02/14/15 She is allergic to aspirin and will have to stay on Sarah Ray changed to plavix with good P2Y due to cost   Chol:  On zocor labs with primary

## 2015-04-22 ENCOUNTER — Ambulatory Visit (INDEPENDENT_AMBULATORY_CARE_PROVIDER_SITE_OTHER): Payer: Medicare Other | Admitting: Cardiovascular Disease

## 2015-04-22 ENCOUNTER — Encounter: Payer: Self-pay | Admitting: Cardiovascular Disease

## 2015-04-22 VITALS — BP 130/80 | HR 64 | Ht 61.0 in | Wt 125.8 lb

## 2015-04-22 DIAGNOSIS — I2510 Atherosclerotic heart disease of native coronary artery without angina pectoris: Secondary | ICD-10-CM

## 2015-04-22 DIAGNOSIS — I2583 Coronary atherosclerosis due to lipid rich plaque: Principal | ICD-10-CM

## 2015-04-22 NOTE — Patient Instructions (Signed)
Medication Instructions:  NO CHANGES  Labwork: NONE  Testing/Procedures: NONE  Follow-Up: Your physician wants you to follow-up in: 6 MONTHS WITH DR NISHAN You will receive a reminder letter in the mail two months in advance. If you don't receive a letter, please call our office to schedule the follow-up appointment.  Any Other Special Instructions Will Be Listed Below (If Applicable).   

## 2015-04-27 ENCOUNTER — Other Ambulatory Visit: Payer: Self-pay | Admitting: Gastroenterology

## 2015-04-27 ENCOUNTER — Other Ambulatory Visit: Payer: Self-pay | Admitting: Cardiovascular Disease

## 2015-05-02 ENCOUNTER — Encounter: Payer: Self-pay | Admitting: Gynecology

## 2015-05-02 ENCOUNTER — Ambulatory Visit (INDEPENDENT_AMBULATORY_CARE_PROVIDER_SITE_OTHER): Payer: Medicare Other | Admitting: Gynecology

## 2015-05-02 VITALS — BP 118/76 | Ht 61.0 in | Wt 125.0 lb

## 2015-05-02 DIAGNOSIS — N952 Postmenopausal atrophic vaginitis: Secondary | ICD-10-CM | POA: Diagnosis not present

## 2015-05-02 DIAGNOSIS — M8580 Other specified disorders of bone density and structure, unspecified site: Secondary | ICD-10-CM

## 2015-05-02 DIAGNOSIS — R1032 Left lower quadrant pain: Secondary | ICD-10-CM | POA: Diagnosis not present

## 2015-05-02 DIAGNOSIS — Z01419 Encounter for gynecological examination (general) (routine) without abnormal findings: Secondary | ICD-10-CM | POA: Diagnosis not present

## 2015-05-02 DIAGNOSIS — N8111 Cystocele, midline: Secondary | ICD-10-CM | POA: Diagnosis not present

## 2015-05-02 NOTE — Progress Notes (Signed)
Sarah Ray [birth year] [id number]        74 y.o.  [id number] for breast and pelvic exam  Past medical history,surgical history, problem list, medications, allergies, family history and social history were all reviewed and documented as reviewed in the EPIC chart.  ROS:  Performed with pertinent positives and negatives included in the history, assessment and plan.   Additional significant findings :  none   Exam: Kim Counsellor Vitals:   05/02/15 1024  BP: 118/76  Height: 5\' 1"  (1.549 m)  Weight: 125 lb (56.7 kg)   General appearance:  Normal affect, orientation and appearance. Skin: Grossly normal HEENT: Without gross lesions.  No cervical or supraclavicular adenopathy. Thyroid normal.  Lungs:  Clear without wheezing, rales or rhonchi Cardiac: RR, without RMG Abdominal:  Soft, nontender, without masses, guarding, rebound, organomegaly or hernia Breasts:  Examined lying and sitting without masses, retractions, discharge or axillary adenopathy. Pelvic:  Ext/BUS/vagina with atrophic changes. First-degree cystocele  Adnexa  Without masses or tenderness    Anus and perineum  Normal   Rectovaginal  Normal sphincter tone without palpated masses or tenderness.    Assessment/Plan:  74 y.o. [id number] female for breast and pelvic exam.   1. Postmenopausal/atrophic genital changes. Status post hysterectomy for leiomyoma 1997. Doing well without significant hot flushes, night sweats, vaginal dryness. 2. Left lower quadrant pain. Been going on for nearly a year. Aching in nature. No nausea vomiting diarrhea constipation. No urinary symptoms such as frequency dysuria or urgency. Colonoscopy 2015. Actually has appointment to see gastroenterologist this month. Exam is normal. Recommend follow up with gastroenterology for further evaluation. 3. Mild cystocele. Present and stable for years. Not bothersome to the patient. Continue observation. Check urinalysis. 4. Osteopenia. DEXA 2015 T score -2.0.  FRAX 19.8%/4.8%. Statistically significant decrease at measured sites. Reviewed treatment options last year and she declined and was going to discuss with her primary physician. She subtotally had an MI and has been having a lot of issues adjusting medications and such. She's decided to "put this on the back burner for now". Increased risk for fracture reviewed and she understands and declines any treatment at this point now. Will plan on doing her DEXA next year. Increased calcium vitamin D reviewed. 5. Mammography today. Continue with annual mammography. SBE monthly reviewed. 6. Pap smear 2011. No Pap smear done today. No history of abnormal Pap smears. Status post hysterectomy for benign indications. Over the age of 1. We both agree to stop screening. 7. Colonoscopy 2015. Follow up with gastroenterology this month scheduled. 8. Health maintenance. No routine blood work done as this is done at her primary physician's office. Follow up in one year, sooner as needed.   Anastasio Auerbach MD, 10:52 AM 05/02/2015

## 2015-05-02 NOTE — Patient Instructions (Addendum)
Follow up with your gastroenterologist as scheduled in reference to your left-sided pain.  You may obtain a copy of any labs that were done today by logging onto MyChart as outlined in the instructions provided with your AVS (after visit summary). The office will not call with normal lab results but certainly if there are any significant abnormalities then we will contact you.   Health Maintenance, Female A healthy lifestyle and preventative care can promote health and wellness.  Maintain regular health, dental, and eye exams.  Eat a healthy diet. Foods like vegetables, fruits, whole grains, low-fat dairy products, and lean protein foods contain the nutrients you need without too many calories. Decrease your intake of foods high in solid fats, added sugars, and salt. Get information about a proper diet from your caregiver, if necessary.  Regular physical exercise is one of the most important things you can do for your health. Most adults should get at least 150 minutes of moderate-intensity exercise (any activity that increases your heart rate and causes you to sweat) each week. In addition, most adults need muscle-strengthening exercises on 2 or more days a week.   Maintain a healthy weight. The body mass index (BMI) is a screening tool to identify possible weight problems. It provides an estimate of body fat based on height and weight. Your caregiver can help determine your BMI, and can help you achieve or maintain a healthy weight. For adults 20 years and older:  A BMI below 18.5 is considered underweight.  A BMI of 18.5 to 24.9 is normal.  A BMI of 25 to 29.9 is considered overweight.  A BMI of 30 and above is considered obese.  Maintain normal blood lipids and cholesterol by exercising and minimizing your intake of saturated fat. Eat a balanced diet with plenty of fruits and vegetables. Blood tests for lipids and cholesterol should begin at age 20 and be repeated every 5 years. If your  lipid or cholesterol levels are high, you are over 50, or you are a high risk for heart disease, you may need your cholesterol levels checked more frequently.Ongoing high lipid and cholesterol levels should be treated with medicines if diet and exercise are not effective.  If you smoke, find out from your caregiver how to quit. If you do not use tobacco, do not start.  Lung cancer screening is recommended for adults aged 55 80 years who are at high risk for developing lung cancer because of a history of smoking. Yearly low-dose computed tomography (CT) is recommended for people who have at least a 30-pack-year history of smoking and are a current smoker or have quit within the past 15 years. A pack year of smoking is smoking an average of 1 pack of cigarettes a day for 1 year (for example: 1 pack a day for 30 years or 2 packs a day for 15 years). Yearly screening should continue until the smoker has stopped smoking for at least 15 years. Yearly screening should also be stopped for people who develop a health problem that would prevent them from having lung cancer treatment.  If you are pregnant, do not drink alcohol. If you are breastfeeding, be very cautious about drinking alcohol. If you are not pregnant and choose to drink alcohol, do not exceed 1 drink per day. One drink is considered to be 12 ounces (355 mL) of beer, 5 ounces (148 mL) of wine, or 1.5 ounces (44 mL) of liquor.  Avoid use of street drugs. Do not share   needles with anyone. Ask for help if you need support or instructions about stopping the use of drugs.  High blood pressure causes heart disease and increases the risk of stroke. Blood pressure should be checked at least every 1 to 2 years. Ongoing high blood pressure should be treated with medicines, if weight loss and exercise are not effective.  If you are 55 to 76 years old, ask your caregiver if you should take aspirin to prevent strokes.  Diabetes screening involves taking a  blood sample to check your fasting blood sugar level. This should be done once every 3 years, after age 45, if you are within normal weight and without risk factors for diabetes. Testing should be considered at a younger age or be carried out more frequently if you are overweight and have at least 1 risk factor for diabetes.  Breast cancer screening is essential preventative care for women. You should practice "breast self-awareness." This means understanding the normal appearance and feel of your breasts and may include breast self-examination. Any changes detected, no matter how small, should be reported to a caregiver. Women in their 20s and 30s should have a clinical breast exam (CBE) by a caregiver as part of a regular health exam every 1 to 3 years. After age 40, women should have a CBE every year. Starting at age 40, women should consider having a mammogram (breast X-ray) every year. Women who have a family history of breast cancer should talk to their caregiver about genetic screening. Women at a high risk of breast cancer should talk to their caregiver about having an MRI and a mammogram every year.  Breast cancer gene (BRCA)-related cancer risk assessment is recommended for women who have family members with BRCA-related cancers. BRCA-related cancers include breast, ovarian, tubal, and peritoneal cancers. Having family members with these cancers may be associated with an increased risk for harmful changes (mutations) in the breast cancer genes BRCA1 and BRCA2. Results of the assessment will determine the need for genetic counseling and BRCA1 and BRCA2 testing.  The Pap test is a screening test for cervical cancer. Women should have a Pap test starting at age 21. Between ages 21 and 29, Pap tests should be repeated every 2 years. Beginning at age 30, you should have a Pap test every 3 years as long as the past 3 Pap tests have been normal. If you had a hysterectomy for a problem that was not cancer or  a condition that could lead to cancer, then you no longer need Pap tests. If you are between ages 65 and 70, and you have had normal Pap tests going back 10 years, you no longer need Pap tests. If you have had past treatment for cervical cancer or a condition that could lead to cancer, you need Pap tests and screening for cancer for at least 20 years after your treatment. If Pap tests have been discontinued, risk factors (such as a new sexual partner) need to be reassessed to determine if screening should be resumed. Some women have medical problems that increase the chance of getting cervical cancer. In these cases, your caregiver may recommend more frequent screening and Pap tests.  The human papillomavirus (HPV) test is an additional test that may be used for cervical cancer screening. The HPV test looks for the virus that can cause the cell changes on the cervix. The cells collected during the Pap test can be tested for HPV. The HPV test could be used to screen women   aged 30 years and older, and should be used in women of any age who have unclear Pap test results. After the age of 30, women should have HPV testing at the same frequency as a Pap test.  Colorectal cancer can be detected and often prevented. Most routine colorectal cancer screening begins at the age of 50 and continues through age 75. However, your caregiver may recommend screening at an earlier age if you have risk factors for colon cancer. On a yearly basis, your caregiver may provide home test kits to check for hidden blood in the stool. Use of a small camera at the end of a tube, to directly examine the colon (sigmoidoscopy or colonoscopy), can detect the earliest forms of colorectal cancer. Talk to your caregiver about this at age 50, when routine screening begins. Direct examination of the colon should be repeated every 5 to 10 years through age 75, unless early forms of pre-cancerous polyps or small growths are found.  Hepatitis C  blood testing is recommended for all people born from 1945 through 1965 and any individual with known risks for hepatitis C.  Practice safe sex. Use condoms and avoid high-risk sexual practices to reduce the spread of sexually transmitted infections (STIs). Sexually active women aged 25 and younger should be checked for Chlamydia, which is a common sexually transmitted infection. Older women with new or multiple partners should also be tested for Chlamydia. Testing for other STIs is recommended if you are sexually active and at increased risk.  Osteoporosis is a disease in which the bones lose minerals and strength with aging. This can result in serious bone fractures. The risk of osteoporosis can be identified using a bone density scan. Women ages 65 and over and women at risk for fractures or osteoporosis should discuss screening with their caregivers. Ask your caregiver whether you should be taking a calcium supplement or vitamin D to reduce the rate of osteoporosis.  Menopause can be associated with physical symptoms and risks. Hormone replacement therapy is available to decrease symptoms and risks. You should talk to your caregiver about whether hormone replacement therapy is right for you.  Use sunscreen. Apply sunscreen liberally and repeatedly throughout the day. You should seek shade when your shadow is shorter than you. Protect yourself by wearing long sleeves, pants, a wide-brimmed hat, and sunglasses year round, whenever you are outdoors.  Notify your caregiver of new moles or changes in moles, especially if there is a change in shape or color. Also notify your caregiver if a mole is larger than the size of a pencil eraser.  Stay current with your immunizations. Document Released: 03/22/2011 Document Revised: 01/01/2013 Document Reviewed: 03/22/2011 [employer] Patient Information 2014 [employer].   

## 2015-05-03 LAB — URINALYSIS W MICROSCOPIC + REFLEX CULTURE
BACTERIA UA: NONE SEEN [HPF]
Bilirubin Urine: NEGATIVE
Casts: NONE SEEN [LPF]
Crystals: NONE SEEN [HPF]
Glucose, UA: NEGATIVE
HGB URINE DIPSTICK: NEGATIVE
Ketones, ur: NEGATIVE
Nitrite: NEGATIVE
PH: 7.5 (ref 5.0–8.0)
PROTEIN: NEGATIVE
RBC / HPF: NONE SEEN RBC/HPF (ref <=2)
SPECIFIC GRAVITY, URINE: 1.004 (ref 1.001–1.035)
Squamous Epithelial / LPF: NONE SEEN [HPF] (ref <=5)
WBC UA: NONE SEEN WBC/HPF (ref <=5)
Yeast: NONE SEEN [HPF]

## 2015-05-05 ENCOUNTER — Encounter: Payer: Self-pay | Admitting: Gynecology

## 2015-05-05 ENCOUNTER — Other Ambulatory Visit: Payer: Self-pay | Admitting: Gynecology

## 2015-05-05 LAB — URINE CULTURE

## 2015-05-05 MED ORDER — AMPICILLIN 500 MG PO CAPS: 500.0000 mg | ORAL_CAPSULE | Freq: Four times a day (QID) | ORAL | Status: DC

## 2015-05-13 ENCOUNTER — Ambulatory Visit (INDEPENDENT_AMBULATORY_CARE_PROVIDER_SITE_OTHER): Payer: Medicare Other | Admitting: Gastroenterology

## 2015-05-13 ENCOUNTER — Other Ambulatory Visit (INDEPENDENT_AMBULATORY_CARE_PROVIDER_SITE_OTHER): Payer: Medicare Other

## 2015-05-13 ENCOUNTER — Encounter: Payer: Self-pay | Admitting: Gastroenterology

## 2015-05-13 VITALS — BP 128/76 | HR 72 | Ht 61.0 in | Wt 127.0 lb

## 2015-05-13 DIAGNOSIS — Z86010 Personal history of colonic polyps: Secondary | ICD-10-CM | POA: Diagnosis not present

## 2015-05-13 DIAGNOSIS — R079 Chest pain, unspecified: Secondary | ICD-10-CM | POA: Diagnosis not present

## 2015-05-13 DIAGNOSIS — R1031 Right lower quadrant pain: Secondary | ICD-10-CM

## 2015-05-13 DIAGNOSIS — K2100 Gastro-esophageal reflux disease with esophagitis, without bleeding: Secondary | ICD-10-CM

## 2015-05-13 LAB — BASIC METABOLIC PANEL
BUN: 12 mg/dL (ref 6–23)
CHLORIDE: 105 meq/L (ref 96–112)
CO2: 31 mEq/L (ref 19–32)
CREATININE: 0.71 mg/dL (ref 0.40–1.20)
Calcium: 9.3 mg/dL (ref 8.4–10.5)
GFR: 85.05 mL/min (ref >60.00)
Glucose, Bld: 85 mg/dL (ref 70–99)
Potassium: 4.2 mEq/L (ref 3.5–5.1)
Sodium: 140 mEq/L (ref 135–145)

## 2015-05-13 NOTE — Assessment & Plan Note (Signed)
Continue pantoprazole 40 mg daily and standard antireflux measures.

## 2015-05-13 NOTE — Assessment & Plan Note (Signed)
Five-year interval surveillance colonoscopy due in January 2020.

## 2015-05-13 NOTE — Patient Instructions (Signed)
Your physician has requested that you go to the basement for the following lab work before leaving today:Bmet.   You have been scheduled for a CT scan of the abdomen and pelvis at [facility] ([address]---this is in the same building as [facility]).   You are scheduled on 05/19/15 at 1:00pm. You should arrive 15 minutes prior to your appointment time for registration. Please follow the written instructions below on the day of your exam:  WARNING: IF YOU ARE ALLERGIC TO IODINE/X-RAY DYE, PLEASE NOTIFY RADIOLOGY IMMEDIATELY AT [phone]! YOU WILL BE GIVEN A 13 HOUR PREMEDICATION PREP.  1) Do not eat or drink anything after 9:00am (4 hours prior to your test) 2) You have been given 2 bottles of oral contrast to drink. The solution may taste better if refrigerated, but do NOT add ice or any other liquid to this solution. Shake well before drinking.    Drink 1 bottle of contrast @ 11:00am (2 hours prior to your exam)  Drink 1 bottle of contrast @ 12:00pm (1 hour prior to your exam)  You may take any medications as prescribed with a small amount of water except for the following: Metformin, Glucophage, Glucovance, Avandamet, Riomet, Fortamet, Actoplus Met, Janumet, Glumetza or Metaglip. The above medications must be held the day of the exam AND 48 hours after the exam.  The purpose of you drinking the oral contrast is to aid in the visualization of your intestinal tract. The contrast solution may cause some diarrhea. Before your exam is started, you will be given a small amount of fluid to drink. Depending on your individual set of symptoms, you may also receive an intravenous injection of x-ray contrast/dye. Plan on being at [facility] for 30 minutes or long, depending on the type of exam you are having performed.  This test typically takes 30-45 minutes to complete.  If you have any questions regarding your exam or if you need to reschedule, you may call the CT  department at [phone] between the hours of 8:00 am and 5:00 pm, Monday-Friday.  ________________________________________________________________________  cc: Gilford Rile, MD

## 2015-05-13 NOTE — Progress Notes (Signed)
    History of Present Illness: This is a 74 year old female with a history of GERD, LA class C erosive esophagitis and esophageal stricture. She complains of intermittent burning pain across her entire chest and back. She also has intermittent leg pain and occasional right lower quadrant pain exacerbated by movement. Her diffuse chest pain is not reminiscent of her reflux symptoms which is a typical burning substernal pain exacerbated by meals and these symptoms appear to be well controlled on daily pantoprazole.  Current Medications, Allergies, Past Medical History, Past Surgical History, Family History and Social History were reviewed in [employer] record.  Physical Exam: General: Well developed , well nourished, no acute distress Head: Normocephalic and atraumatic Eyes:  sclerae anicteric, EOMI Ears: Normal auditory acuity Mouth: No deformity or lesions Lungs: Clear throughout to auscultation Heart: Regular rate and rhythm; no murmurs, rubs or bruits Abdomen: Soft, non tender and non distended. No masses, hepatosplenomegaly or hernias noted. Normal Bowel sounds Musculoskeletal: Symmetrical with no gross deformities  Pulses:  Normal pulses noted Extremities: No clubbing, cyanosis, edema or deformities noted Neurological: Alert oriented x 4, grossly nonfocal Psychological:  Alert and cooperative. Normal mood and affect  Assessment and Recommendations:  1. Chest pain, back pain, leg pain.These symptoms are not related to GERD or any gastrointestinal disorder. Possibly related to statin use.  2. Right lower quadrant pain. Be exacerbated by movement. Schedule abdominal/pelvic CT to evaluate for gastrointestinal etiologies.

## 2015-05-19 ENCOUNTER — Ambulatory Visit (INDEPENDENT_AMBULATORY_CARE_PROVIDER_SITE_OTHER)
Admission: RE | Admit: 2015-05-19 | Discharge: 2015-05-19 | Disposition: A | Discharge status: Home / Self Care | Payer: Medicare Other | Source: Ambulatory Visit | Attending: Gastroenterology | Admitting: Gastroenterology

## 2015-05-19 DIAGNOSIS — R1031 Right lower quadrant pain: Secondary | ICD-10-CM | POA: Diagnosis not present

## 2015-05-19 MED ORDER — IOHEXOL 300 MG/ML  SOLN
100.0000 mL | Freq: Once | INTRAMUSCULAR | Status: AC | PRN
  Administered 2015-05-19: 100 mL via INTRAVENOUS

## 2015-06-01 ENCOUNTER — Other Ambulatory Visit: Payer: Self-pay | Admitting: Cardiovascular Disease

## 2015-06-11 ENCOUNTER — Ambulatory Visit (INDEPENDENT_AMBULATORY_CARE_PROVIDER_SITE_OTHER): Payer: Medicare Other | Admitting: Pulmonary Disease

## 2015-06-11 ENCOUNTER — Encounter: Payer: Self-pay | Admitting: Pulmonary Disease

## 2015-06-11 VITALS — BP 122/80 | HR 72 | Ht 61.5 in | Wt 128.6 lb

## 2015-06-11 DIAGNOSIS — IMO0001 Reserved for inherently not codable concepts without codable children: Secondary | ICD-10-CM

## 2015-06-11 DIAGNOSIS — R911 Solitary pulmonary nodule: Secondary | ICD-10-CM

## 2015-06-11 NOTE — Progress Notes (Signed)
Subjective:    Patient ID: Sarah Ray, female    DOB: [birth year], 74 y.o.   MRN: [id number]  HPI Evaluation of lung nodules.  74 year old with past medical history as below. She had a CT of the abdomen for evaluation of abdominal pain. The scan showed incidental findings of 2 small 4 mm nodules in the right and left lower lobe. She has symptoms of nonproductive cough, occasional hoarseness of voice and occasional pressure and burning sensation in the chest. She denies any dyspnea, sputum production hemoptysis. She does not have any loss of weight or appetite, fatigue or malaise.  She is a never smoker, she has not been exposed to secondhand smoke. 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 lives [place] healthy lifestyle with frequent exercise.   Past Medical History  Diagnosis Date  . Hiatal hernia   . Peptic stricture of esophagus   . GERD (gastroesophageal reflux disease)     LA Class Grade C Erosive esophagitis  . Hyperlipidemia   . Internal hemorrhoids   . External hemorrhoids   . Adenomatous polyps 07/1997  . Osteopenia 01/2014     T score -2.0. FRAX 19.8%/4.8% with statistically significant decrease at measured sites  . Breast fibroadenoma   . CAD (coronary artery disease) 03/18/14    STEMI- stent to RCA and nonobstructive disease LAD and LCX  . 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 Skin cancer 2014    "forehead"  . HTN (hypertension)     Current outpatient prescriptions:  .  carvedilol (COREG) 3.125 MG tablet, TAKE ONE TABLET BY MOUTH TWICE DAILY, Disp: 60 tablet, Rfl: 3 .  clopidogrel (PLAVIX) 75 MG tablet, Take 1 tablet (75 mg total) by mouth at bedtime., Disp: 30 tablet, Rfl: 6 .  hydrOXYzine (VISTARIL) 25 MG capsule, Take 25 mg by mouth as needed., Disp: , Rfl:  .  methocarbamol (ROBAXIN) 500 MG tablet, Take 500 mg by mouth as needed. Pt  taking 1/2 tablet, as needed for muscle spasms, Disp: , Rfl: 0 .  nitroGLYCERIN (NITROSTAT) 0.4 MG SL tablet, Place 1 tablet (0.4 mg total) under the tongue every 5 (five) minutes x 3 doses as needed for chest pain., Disp: 25 tablet, Rfl: 4 .  nystatin-triamcinolone ointment (MYCOLOG), Apply 1 application topically daily as needed (for rash)., Disp: , Rfl:  .  pantoprazole (PROTONIX) 40 MG tablet, TAKE ONE TABLET BY MOUTH ONCE DAILY, Disp: 30 tablet, Rfl: 2 .  simvastatin (ZOCOR) 20 MG tablet, TAKE ONE TABLET BY MOUTH AT BEDTIME, Disp: 30 tablet, Rfl: 5   Review of Systems Positive for nonproductive cough, occasional hoarseness of voice and occasional pressure and burning sensation in the chest. She denies any dyspnea, sputum production hemoptysis. She does not have any loss of weight or appetite, fatigue or malaise. All other review of systems are negative.  CT abd/pelvis (05/19/15) 1. No acute findings identified within the abdomen or pelvis. No explanation for patient's right lower quadrant pain. 2. Nonvisualization of the appendix. There are no secondary signs of acute appendicitis. 3. Aortic atherosclerosis. 4. Liver cysts. 5. Several small nonspecific nodules are noted in both lower lobes the largest measures 4 mm. If the patient is at high risk for bronchogenic carcinoma, follow-up chest CT at 1 year is recommended. If the patient is at low risk, no follow-up is needed. This recommendation follows the consensus statem  Blood pressure 122/80, pulse 72, height 5' 1.5" (1.562 m), weight 128 lb 9.6 oz (58.333 kg), SpO2 100 %.     Objective:   Physical Exam  Constitutional: She is oriented to person, place, and time. She appears well-developed and well-nourished.  HENT:  Mouth/Throat: Oropharynx is clear and moist.  Eyes: Conjunctivae are normal. Pupils are equal, round, and reactive to light.  Neck: Normal range of motion. Neck supple.  Cardiovascular: Normal rate, regular rhythm  and normal heart sounds.   Pulmonary/Chest: Effort normal and breath sounds normal.  Abdominal: Soft. Bowel sounds are normal.  Neurological: She is alert and oriented to person, place, and time.  Skin: Skin is warm and dry.      Assessment & Plan:   Subcentimeter pulmonary nodules.  Found incidentally in a CT of the abdomen. Very few risk factors for lung cancer. Findings are likely benign. We decided to get a dedicated CT of the chest to make sure that there are no other abnormalities. I discussed this with the patient and told her that if there are no other findings on this study then she will not need to follow up.  Plan: - CT of chest without contrast  Return in 1 month.  Marshell Garfinkel MD [facility] Pager [phone] If no answer or after 3pm call: [phone] 06/11/2015, 3:33 PM

## 2015-06-11 NOTE — Patient Instructions (Signed)
Get CT of the chest  Return to clinic in 1 month

## 2015-06-17 ENCOUNTER — Other Ambulatory Visit: Payer: Medicare Other

## 2015-06-18 ENCOUNTER — Ambulatory Visit (INDEPENDENT_AMBULATORY_CARE_PROVIDER_SITE_OTHER)
Admission: RE | Admit: 2015-06-18 | Discharge: 2015-06-18 | Disposition: A | Discharge status: Home / Self Care | Payer: Medicare Other | Source: Ambulatory Visit | Attending: Pulmonary Disease | Admitting: Pulmonary Disease

## 2015-06-18 DIAGNOSIS — R911 Solitary pulmonary nodule: Secondary | ICD-10-CM

## 2015-06-18 DIAGNOSIS — IMO0001 Reserved for inherently not codable concepts without codable children: Secondary | ICD-10-CM

## 2015-06-26 NOTE — Progress Notes (Signed)
Quick Note:  CT results sent to Dr Bea Graff via [facility] ______

## 2015-07-03 ENCOUNTER — Telehealth: Payer: Self-pay | Admitting: Cardiovascular Disease

## 2015-07-03 NOTE — Telephone Encounter (Signed)
New Message  Pt calling to change pharmacy and dosage. Please call back and discuss.  STAT if patient is at the pharmacy , call can be transferred to refill team.   1. Which medications need to be refilled? None- changing pharmacy and dosage-- as below   2. Which pharmacy/location is medication to be sent to? From [facility] in [place] to [facility] in [place]   3. Do they need a 30 day or 90 day supply? Changing Simvostatin (20 mg) and Carvedilol to 90 day supply

## 2015-07-04 ENCOUNTER — Other Ambulatory Visit: Payer: Self-pay

## 2015-07-04 MED ORDER — CARVEDILOL 3.125 MG PO TABS: 3.1250 mg | ORAL_TABLET | Freq: Two times a day (BID) | ORAL | Status: DC

## 2015-07-04 MED ORDER — SIMVASTATIN 20 MG PO TABS: 20.0000 mg | ORAL_TABLET | Freq: Every day | ORAL | Status: DC

## 2015-07-21 ENCOUNTER — Ambulatory Visit: Payer: Medicare Other | Admitting: Pulmonary Disease

## 2015-07-21 ENCOUNTER — Ambulatory Visit (INDEPENDENT_AMBULATORY_CARE_PROVIDER_SITE_OTHER): Payer: Medicare Other | Admitting: Pulmonary Disease

## 2015-07-21 ENCOUNTER — Telehealth: Payer: Self-pay | Admitting: Gastroenterology

## 2015-07-21 ENCOUNTER — Telehealth: Payer: Self-pay | Admitting: Cardiovascular Disease

## 2015-07-21 ENCOUNTER — Encounter: Payer: Self-pay | Admitting: Pulmonary Disease

## 2015-07-21 VITALS — BP 136/82 | HR 66 | Ht 61.5 in | Wt 126.8 lb

## 2015-07-21 DIAGNOSIS — R911 Solitary pulmonary nodule: Secondary | ICD-10-CM | POA: Diagnosis not present

## 2015-07-21 DIAGNOSIS — IMO0001 Reserved for inherently not codable concepts without codable children: Secondary | ICD-10-CM

## 2015-07-21 NOTE — Telephone Encounter (Signed)
NO  SBE REQUIRED  UNLESS PT  HAS  HX  OF ENDOCARDITIS OR VALVE  DR HILL'S  OFFICE NOTIFIED .Sarah Ray

## 2015-07-21 NOTE — Patient Instructions (Addendum)
Your CT scan shows multiple small nodules that do not need follow up as you are at low risk for a malignancy.  Follow up as needed

## 2015-07-21 NOTE — Telephone Encounter (Signed)
LM TO CALL BACK ./CY 

## 2015-07-21 NOTE — Telephone Encounter (Signed)
Request for surgical clearance:  1. What type of surgery is being performed? DENTAL CLEANING  2. When is this surgery scheduled? 07/22/15 @ 11am  3. Are there any medications that need to be held prior to surgery and how long? No medications need to be held  4. Name of physician performing surgery? Dr. Donnelly Angelica office [facility]  5. What is your office phone and fax number?  [phone]   Pt has a stent and she wants to know if Dr.Nishan will put her on an antibiotic or not, office needs to know today by 12pm

## 2015-07-21 NOTE — Progress Notes (Signed)
Subjective:    Patient ID: Sarah Ray, female    DOB: Dec 06, 1940, 74 y.o.   MRN: [id number]  HPI  Evaluation of lung nodules.  74 year old with past medical history as below. She had a CT of the abdomen for evaluation of abdominal pain. The scan showed incidental findings of 2 small 4 mm nodules in the right and left lower lobe. She has symptoms of nonproductive cough, occasional hoarseness of voice and occasional pressure and burning sensation in the chest. She denies any dyspnea, sputum production hemoptysis. She does not have any loss of weight or appetite, fatigue or malaise.  She is a never smoker, she has not been exposed to secondhand smoke. 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 lives a healthy lifestyle with frequent exercise.   Interim History: She had a CT of the chest showed multiple small 4 mm lung nodules. She also had a left thyroid nodule. This was followed up by a ultrasound (07/10/15) which showed a heterogenous and multinodular nodular thyroid similar to a comparison study of 2008. Dominant nodule in both lobes.  Past Medical History  Diagnosis Date  . Hiatal hernia   . Peptic stricture of esophagus   . GERD (gastroesophageal reflux disease)     LA Class Grade C Erosive esophagitis  . Hyperlipidemia   . Internal hemorrhoids   . External hemorrhoids   . Adenomatous polyps 07/1997  . Osteopenia 01/2014     T score -2.0. FRAX 19.8%/4.8% with statistically significant decrease at measured sites  . Breast fibroadenoma   . CAD (coronary artery disease) 03/18/14    STEMI- stent to RCA and nonobstructive disease LAD and LCX  . ST elevation myocardial infarction (STEMI) involving right coronary artery in recovery phase ([place]) 03/18/14  . Skin cancer 2014    "forehead"  . HTN (hypertension)     Current outpatient prescriptions:  .  carvedilol (COREG) 3.125 MG tablet, Take 1 tablet (3.125 mg  total) by mouth 2 (two) times daily., Disp: 180 tablet, Rfl: 1 .  clopidogrel (PLAVIX) 75 MG tablet, Take 1 tablet (75 mg total) by mouth at bedtime., Disp: 30 tablet, Rfl: 6 .  hydrOXYzine (VISTARIL) 25 MG capsule, Take 25 mg by mouth as needed., Disp: , Rfl:  .  methocarbamol (ROBAXIN) 500 MG tablet, Take 500 mg by mouth as needed. Pt taking 1/2 tablet, as needed for muscle spasms, Disp: , Rfl: 0 .  nitroGLYCERIN (NITROSTAT) 0.4 MG SL tablet, Place 1 tablet (0.4 mg total) under the tongue every 5 (five) minutes x 3 doses as needed for chest pain., Disp: 25 tablet, Rfl: 4 .  nystatin-triamcinolone ointment (MYCOLOG), Apply 1 application topically daily as needed (for rash)., Disp: , Rfl:  .  pantoprazole (PROTONIX) 40 MG tablet, TAKE ONE TABLET BY MOUTH ONCE DAILY, Disp: 30 tablet, Rfl: 2 .  simvastatin (ZOCOR) 20 MG tablet, Take 1 tablet (20 mg total) by mouth at bedtime., Disp: 90 tablet, Rfl: 1   Review of Systems  Positive for nonproductive cough, occasional hoarseness of voice and occasional pressure and burning sensation in the chest. She denies any dyspnea, sputum production hemoptysis. She does not have any loss of weight or appetite, fatigue or malaise. All other review of systems are negative.  CT abd/pelvis (05/19/15) 1. No acute findings identified within the abdomen or pelvis. No explanation for patient's right lower quadrant pain. 2. Nonvisualization of the appendix. There are no  secondary signs of acute appendicitis. 3. Aortic atherosclerosis. 4. Liver cysts. 5. Several small nonspecific nodules are noted in both lower lobes the largest measures 4 mm. If the patient is at high risk for bronchogenic carcinoma, follow-up chest CT at 1 year is recommended. If the patient is at low risk, no follow-up is needed. This recommendation follows the consensus statem  CT chest (06/18/15) LEFT thyroid nodule ; followup thyroid sonographic assessment recommended. Scattered coronary  arterial calcifications. Multiple BILATERAL pulmonary nodules 4 mm diameter or less.  Thyroid  ultrasound (07/10/15)  Heterogeneous and multinodular thyroid similar to a comparison study of 2008. Dominant nodule of both right and left lobes would meet criteria for biopsy, although the patient reports a history of prior biopsy., And correlation with prior biopsy results is recommended before a repeat biopsy.  Blood pressure 136/82, pulse 66, height 5' 1.5" (1.562 m), weight 126 lb 12.8 oz (57.516 kg), SpO2 100 %.      Objective:   Physical Exam  Constitutional: She is oriented to person, place, and time. She appears well-developed and well-nourished.  HENT:  Mouth/Throat: Oropharynx is clear and moist.  Eyes: Conjunctivae are normal. Pupils are equal, round, and reactive to light.  Neck: Normal range of motion. Neck supple.  Cardiovascular: Normal rate, regular rhythm and normal heart sounds.   Pulmonary/Chest: Effort normal and breath sounds normal.  Abdominal: Soft. Bowel sounds are normal.  Neurological: She is alert and oriented to person, place, and time.  Skin: Skin is warm and dry.      Assessment & Plan:   Subcentimeter pulmonary nodules.  Findings are likely benign. She is at very low risk for lung cancer as she is a nonsmoker with no obvious exposures. She would not need regular follow-up imaging of her lung nodules. I discussed this with her and told her that she may follow-up with [place] as needed.   Marshell Garfinkel MD [facility] Pager [phone] If no answer or after 3pm call: [phone] 07/21/2015, 9:45 AM

## 2015-07-22 MED ORDER — PANTOPRAZOLE SODIUM 40 MG PO TBEC: 40.0000 mg | DELAYED_RELEASE_TABLET | Freq: Every day | ORAL | Status: DC

## 2015-07-22 NOTE — Telephone Encounter (Signed)
Prescription sent to patient's pharmacy.

## 2015-07-31 NOTE — Telephone Encounter (Signed)
Pt's refills have been sent to her pharmacy. Confirmation received

## 2015-08-18 ENCOUNTER — Other Ambulatory Visit: Payer: Self-pay | Admitting: Cardiovascular Disease

## 2015-09-11 ENCOUNTER — Ambulatory Visit (INDEPENDENT_AMBULATORY_CARE_PROVIDER_SITE_OTHER): Payer: Medicare Other | Admitting: Gastroenterology

## 2015-09-11 ENCOUNTER — Encounter: Payer: Self-pay | Admitting: Gastroenterology

## 2015-09-11 VITALS — BP 120/80 | HR 80 | Ht 60.5 in | Wt 129.2 lb

## 2015-09-11 DIAGNOSIS — R079 Chest pain, unspecified: Secondary | ICD-10-CM | POA: Diagnosis not present

## 2015-09-11 DIAGNOSIS — K2100 Gastro-esophageal reflux disease with esophagitis, without bleeding: Secondary | ICD-10-CM

## 2015-09-11 NOTE — Progress Notes (Signed)
    History of Present Illness: This is a 74 year old female with frequent diffuse chest discomfort that radiates to her back. She also has pain and heaviness in her arms and legs. She states she just does not feel as well as she used to. Her reflux symptoms are under very good control she has a rare episode of mid substernal burning pain that is relieved by Tums that does not relate to her other symptoms. I evaluated her for the same complaints in September.  Current Medications, Allergies, Past Medical History, Past Surgical History, Family History and Social History were reviewed in [employer] record.  Physical Exam: General: Well developed, well nourished, no acute distress Head: Normocephalic and atraumatic Eyes:  sclerae anicteric, EOMI Ears: Normal auditory acuity Mouth: No deformity or lesions Lungs: Clear throughout to auscultation Heart: Regular rate and rhythm; no murmurs, rubs or bruits Abdomen: Soft, non tender and non distended. No masses, hepatosplenomegaly or hernias noted. Normal Bowel sounds Musculoskeletal: Symmetrical with no gross deformities  Pulses:  Normal pulses noted Extremities: No clubbing, cyanosis, edema or deformities noted Neurological: Alert oriented x 4, grossly nonfocal Psychological:  Alert and cooperative. Anxious.   Assessment and Recommendations:  1. GERD with LA class C erosive esophagitis. Well controlled on Protonix 40 mg qam and TUMS prn. REV in 1 year.  2. Chest pain, back pain, arm pain, leg pain.These symptoms are not related to GERD or any gastrointestinal disorder. Possibly related to statin side effects. She had many questions about these symptoms. I reassured her that they are not gastrointestinal in etiology. I advised her to discuss these symptoms with her PCP and Cardiologist.   I spent 15 minutes of face-to-face time with the patient. Greater than 50% of the time was spent counseling and coordinating care.

## 2015-09-11 NOTE — Patient Instructions (Signed)
Stay on PPI.   Follow up with your Primary Care Physician for your chest pain.  Thank you for choosing me and [facility].  [name]. Dagoberto Ligas., MD., Marval Regal  cc: Gilford Rile, MD

## 2015-10-15 NOTE — Progress Notes (Signed)
Patient ID: Sarah Ray, female   DOB: [birth year], 75 y.o.   MRN: [id number]   75 y.o.  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 I take care of her husband [name] and she wanted to f/u with me. 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 Has not taken lisinopril  Reviewed echo from9/28/15   EF normal 55% Trivial MR Inferior wall motion normal   Seen by Dr Fuller Plan 11/15 with mild erosive esophagitis and stricture Rx protonix   9/15  P2Y 91 showing good platlet inhibition on plavix    Tends to hear temporal pulse when BP high  Seeing orthopedist for possible back injections  Still with SSCP radiating down arms Like previous MI  Except no indigestion Exercise helps pain and nitro makes no difference  Seen in ER 02/11/15 with atypical pain r/o d/c.  F/U myovue done 02/14/15 reviewed and normal with no ischemia or infarct    ROS: Denies fever, malais, weight loss, blurry vision, decreased visual acuity, cough, sputum, SOB, hemoptysis, pleuritic pain, palpitaitons, heartburn, abdominal pain, melena, lower extremity edema, claudication, or rash.  All other systems reviewed and negative  General: Affect appropriate Healthy:  appears stated age 8: normal Neck supple with no adenopathy JVP normal no bruits no thyromegaly Lungs clear with no wheezing and good diaphragmatic motion Heart:  S1/S2 no murmur, no rub, gallop  or click PMI normal Abdomen: benighn, BS positve, no tenderness, no AAA no bruit.  No HSM or HJR Distal pulses intact with no bruits No edema Neuro non-focal Skin warm and dry No muscular weakness   Current Outpatient Prescriptions  Medication Sig Dispense Refill  . carvedilol (COREG) 3.125 MG tablet Take 1 tablet (3.125 mg total) by mouth 2 (two) times daily. 180 tablet 1  . clopidogrel (PLAVIX) 75 MG tablet Take 1 tablet (75 mg total) by mouth at bedtime. 30 tablet 5  . hydrOXYzine (VISTARIL) 25 MG capsule Take 25 mg by mouth as directed.     . methocarbamol (ROBAXIN) 500 MG tablet Take 250 mg by mouth as directed.    . nitroGLYCERIN (NITROSTAT) 0.4 MG SL tablet Place 1 tablet (0.4 mg total) under the tongue every 5 (five) minutes x 3 doses as needed for chest pain. 25 tablet 4  . nystatin-triamcinolone ointment (MYCOLOG) Apply 1 application topically daily as needed (for rash).    . pantoprazole (PROTONIX) 40 MG tablet Take 1 tablet (40 mg total) by mouth daily. 30 tablet 5  . simvastatin (ZOCOR) 20 MG tablet Take 1 tablet (20 mg total) by mouth at bedtime. 90 tablet 1   No current facility-administered medications for this visit.    Allergies  Brilinta; Aspirin; Lipitor; and Other  Electrocardiogram: 06/17/14   SR rate 82 LAD nonspecific ST changes  06/17/14   SR rate 59 normal   Assessment and Plan CAD:  Recurrent symptoms not typical for  angina with improvement exercise and no change in nitro 03/18/14 RCA stent with f/u cath  04/22/14 no restenosis Normal myovue 02/14/15 She is allergic to aspirin and will have to stay on plavix  [place] changed to plavix with good P2Y due to cost   Chol:  On zocor labs with primary  Esophagitis:  On protonix improved    F/U with me in 6 months  Jenkins Rouge

## 2015-10-21 ENCOUNTER — Ambulatory Visit (INDEPENDENT_AMBULATORY_CARE_PROVIDER_SITE_OTHER): Payer: PPO | Admitting: Cardiovascular Disease

## 2015-10-21 ENCOUNTER — Encounter: Payer: Self-pay | Admitting: Cardiovascular Disease

## 2015-10-21 VITALS — BP 138/84 | HR 88 | Ht 61.0 in | Wt 126.8 lb

## 2015-10-21 DIAGNOSIS — I2583 Coronary atherosclerosis due to lipid rich plaque: Principal | ICD-10-CM

## 2015-10-21 DIAGNOSIS — I2510 Atherosclerotic heart disease of native coronary artery without angina pectoris: Secondary | ICD-10-CM

## 2015-10-21 NOTE — Patient Instructions (Signed)

## 2015-11-25 DIAGNOSIS — E782 Mixed hyperlipidemia: Secondary | ICD-10-CM | POA: Diagnosis not present

## 2015-11-25 DIAGNOSIS — Z79899 Other long term (current) drug therapy: Secondary | ICD-10-CM | POA: Diagnosis not present

## 2015-11-25 DIAGNOSIS — R5383 Other fatigue: Secondary | ICD-10-CM | POA: Diagnosis not present

## 2015-11-25 DIAGNOSIS — M8580 Other specified disorders of bone density and structure, unspecified site: Secondary | ICD-10-CM | POA: Diagnosis not present

## 2015-11-25 DIAGNOSIS — R5381 Other malaise: Secondary | ICD-10-CM | POA: Diagnosis not present

## 2015-11-25 DIAGNOSIS — E041 Nontoxic single thyroid nodule: Secondary | ICD-10-CM | POA: Diagnosis not present

## 2015-11-25 DIAGNOSIS — F411 Generalized anxiety disorder: Secondary | ICD-10-CM | POA: Diagnosis not present

## 2015-11-25 DIAGNOSIS — I10 Essential (primary) hypertension: Secondary | ICD-10-CM | POA: Diagnosis not present

## 2015-11-25 DIAGNOSIS — M5030 Other cervical disc degeneration, unspecified cervical region: Secondary | ICD-10-CM | POA: Diagnosis not present

## 2015-11-25 DIAGNOSIS — E559 Vitamin D deficiency, unspecified: Secondary | ICD-10-CM | POA: Diagnosis not present

## 2015-12-10 ENCOUNTER — Ambulatory Visit (INDEPENDENT_AMBULATORY_CARE_PROVIDER_SITE_OTHER): Payer: PPO | Admitting: Internal Medicine

## 2015-12-10 ENCOUNTER — Encounter: Payer: Self-pay | Admitting: Internal Medicine

## 2015-12-10 VITALS — BP 110/68 | HR 67 | Temp 98.4°F | Resp 12 | Wt 125.6 lb

## 2015-12-10 DIAGNOSIS — E042 Nontoxic multinodular goiter: Secondary | ICD-10-CM | POA: Insufficient documentation

## 2015-12-10 NOTE — Patient Instructions (Signed)
I will let you know if we need to repeat the biopsies.  We will schedule a new appt as needed.

## 2015-12-10 NOTE — Progress Notes (Addendum)
Patient ID: Sarah Ray, female   DOB: [birth year], 75 y.o.   MRN: [id number]   HPI  Sarah Ray is a 75 y.o.-year-old female, self-referred, for evaluation for thyroid nodules.  Thyroid U/S (07/14/2015):  Right thyroid lobe: 4.3 x 1.9 x 1.7 cm. Heterogeneous. 2 nodules: - Dominant 1.6 x 1.6 x 1.3 cm. No internal micro-calcifications. Hypoechoic margin.  - Inferior 1.1 x 1.0 x 0.8 mm no internal calcifications. Each of these nodules were present on the comparison study of 2008, not significantly changed  Left thyroid lobe: 4.7 x 2.4 x 2.2 cm. Heterogeneous.  - Single dominant nodule, present on the comparison study of 2008:3.3 x 2.2 x 1.9 cm. No internal calcifications.   Isthmus: 4 mm  - Single nodule, 1.4 x 1.1 cm. This nodule was present on the comparison. No internal calcifications.   No lymphadenopathy.  Multinodular thyroid similar to comparison study of 2008.     Pt tells me she had a thyroid nodule resected from the thyroid in the 1960s - this was overactive (has Uptake and Scan then).  She had Bx'es of the dominant 2 nodules in 2008 >> benign.  Pt denies: - feeling nodules in neck - dysphagia - choking - SOB with lying down She does have: + hoarseness  I reviewed pt's thyroid tests: 11/27/2015: TSH 1.079 Lab Results  Component Value Date   TSH 1.100 05/30/2014    Pt denies - fatigue - heat intolerance/cold intolerance - tremors - palpitations - anxiety/depression - hyperdefecation/but she has + constipation - weight loss/weight gain - dry skin - hair loss  No FH of thyroid ds. No FH of thyroid cancer. No h/o radiation tx to head or neck.  No seaweed or kelp. No recent contrast studies. No steroid use. No herbal supplements. No Biotin supplements or Hair, Skin and Nails vitamins.  Pt also has a history of hiatal hernia and esophageal strictures. She also has a history of hypertension, hyperlipidemia, GERD, history of skin cancer.  ROS: Constitutional:  no weight gain/loss, no fatigue, no subjective hyperthermia/hypothermia Eyes: + blurry vision, no xerophthalmia ENT: no sore throat, no nodules palpated in throat, no dysphagia/odynophagia, + decreased hearing, tinnitus, hoarseness Cardiovascular: + CP (unclear if pain referred from cervical spine)/no SOB/palpitations/leg swelling Respiratory: no cough/SOB Gastrointestinal: no N/V/D/C Musculoskeletal: no muscle/joint aches/+ back pain - cervical Skin: + rash, + hair loss, + itching, + easy bruising Neurological: no tremors/numbness/tingling/dizziness Psychiatric: no depression/anxiety  Past Medical History  Diagnosis Date  . Hiatal hernia   . Peptic stricture of esophagus   . GERD (gastroesophageal reflux disease)     LA Class Grade C Erosive esophagitis  . Hyperlipidemia   . Internal hemorrhoids   . External hemorrhoids   . Adenomatous polyps 07/1997  . Osteopenia 01/2014     T score -2.0. FRAX 19.8%/4.8% with statistically significant decrease at measured sites  . Breast fibroadenoma   . CAD (coronary artery disease) 03/18/14    STEMI- stent to RCA and nonobstructive disease LAD and LCX  . ST elevation myocardial infarction (STEMI) involving right coronary artery in recovery phase ([place]) 03/18/14  . Skin cancer 2014    "forehead"  . HTN (hypertension)    Past Surgical History  Procedure Laterality Date  . Thyroid cyst excision  1970's  . Bunionectomy Bilateral 04/04/2013  . Coronary angioplasty with stent placement  03/18/14    Xience DES to totally occl RCA, residual non obstructive disease to LAD and LCX  . Tonsillectomy and adenoidectomy  ~  1950  . Abdominal hysterectomy  1977    Leiomyoma  . Breast cyst aspiration Right ~ 2011  . Esophagogastroduodenoscopy (egd) with esophageal dilation  2000's X 1  . Skin cancer excision  2014  . Left heart cath N/A 03/18/2014    Procedure: LEFT HEART CATH;  Surgeon: Blane Ohara, MD;  Location: [facility] CATH LAB;  Service: Cardiovascular;   Laterality: N/A;  . Left heart catheterization with coronary angiogram N/A 04/22/2014    Procedure: LEFT HEART CATHETERIZATION WITH CORONARY ANGIOGRAM;  Surgeon: Blane Ohara, MD;  Location: [facility] CATH LAB;  Service: Cardiovascular;  Laterality: N/A;   Social History   Social History  . Marital Status: Married    Spouse Name: Jerrianne Morine  . Number of Children: 2   Occupational History  . Dance movement psychotherapist    Social History Main Topics  . Smoking status: Never Smoker   . Smokeless tobacco: Never Used  . Alcohol Use: No  . Drug Use: No   Social History Narrative   Daily caffeine  2 cups per day   Current Outpatient Prescriptions on File Prior to Visit  Medication Sig Dispense Refill  . carvedilol (COREG) 3.125 MG tablet Take 1 tablet (3.125 mg total) by mouth 2 (two) times daily. 180 tablet 1  . clopidogrel (PLAVIX) 75 MG tablet Take 1 tablet (75 mg total) by mouth at bedtime. 30 tablet 5  . hydrOXYzine (VISTARIL) 25 MG capsule Take 25 mg by mouth as directed.     . methocarbamol (ROBAXIN) 500 MG tablet Take 250 mg by mouth as directed.    . nitroGLYCERIN (NITROSTAT) 0.4 MG SL tablet Place 1 tablet (0.4 mg total) under the tongue every 5 (five) minutes x 3 doses as needed for chest pain. 25 tablet 4  . nystatin-triamcinolone ointment (MYCOLOG) Apply 1 application topically daily as needed (for rash).    . pantoprazole (PROTONIX) 40 MG tablet Take 1 tablet (40 mg total) by mouth daily. 30 tablet 5  . simvastatin (ZOCOR) 20 MG tablet Take 1 tablet (20 mg total) by mouth at bedtime. 90 tablet 1   No current facility-administered medications on file prior to visit.   Allergies  Allergen Reactions  . Brilinta [Ticagrelor] Other (See Comments)    Caused chest pains  . Aspirin Hives, Diarrhea and Swelling    Benadryl stops reaction  . Lipitor [Atorvastatin] Other (See Comments)    Myalgias and leg heaviness  . Other Other (See Comments)    Very sensitive to all meds   Family  History  Problem Relation Age of Onset  . Colon cancer Maternal Aunt   . Breast cancer Maternal Aunt     Age 71  . Colon polyps Mother   . Breast cancer Daughter     Age 88  . Heart disease Brother     irregular heart beat   PE: BP 110/68 mmHg  Pulse 67  Temp(Src) 98.4 F (36.9 C) (Oral)  Resp 12  Wt 125 lb 9.6 oz (56.972 kg)  SpO2 98% Wt Readings from Last 3 Encounters:  12/10/15 125 lb 9.6 oz (56.972 kg)  10/21/15 126 lb 12.8 oz (57.516 kg)  09/11/15 129 lb 4 oz (58.627 kg)    Constitutional: overweight, in NAD Eyes: PERRLA, EOMI, no exophthalmos ENT: moist mucous membranes, no thyromegaly, However, her isthmic nodule is palpable and freely movable with the dilatation,  no cervical lymphadenopathy Cardiovascular: RRR, No MRG Respiratory: CTA B Gastrointestinal: abdomen soft, NT, ND, BS+ Musculoskeletal: no  deformities, strength intact in all 4;  Skin: moist, warm, no rashes Neurological: no tremor with outstretched hands, DTR normal in all 4  ASSESSMENT: 1. Thyroid nodules  PLAN: 1. MNG  - I reviewed the report of her 06/2015 thyroid ultrasound along with the patient and her husband. I pointed out that the dominant nodule is large, this being a risk factor for cancer.  Otherwise, the nodules are without microcalcifications, however no further description is available to me in the report, therefore, patient signed a release of information consent form so we can obtain the images of the ultrasound from [facility]. In the report, it was commented that the nodules appear stable compared to the previous ultrasound from 2008. At that time, patient remembers having biopsies of the 2 largest thyroid nodules and these were benign. This is very assuring. Also, She does not have a thyroid cancer family history or a personal history of RxTx to head/neck. All these would favor benignity. I advised the patient that after I review the images of her nodules, depending on the  potential risk of cancer, I may suggest another biopsy. She agrees with this, if needed. - If the nodules do not appear high risk for cancer, since there are no symptoms and they did not appear to have grown since 2008, we probably do not need another ultrasound. However, she understands that she should let me know if she develops neck compression symptoms. Patient accompanies her husband, who is also my patient, to all of his appointments, which I usually scheduled every 3-4 months, so I will continue to be in touch with the patient  Received the CD with the thyroid U/S images from [facility]:  R lobe nodules:   L lobe nodule:   Isthmic nodule:   The R dominant nodule is isoechoic, w/o calcifications or internal blood flow, but it is taller than wide >> will suggest reBx. The R smaller nodule is isoechoic, less well delimited but w/o concerning features. The L nodule is iso-hyperechoic, w/o concerning features other than size. The isthmic nodule is hypoechoic >> will suggest Bx.   Adequacy Reason Satisfactory For Evaluation. Diagnosis THYROID, FINE NEEDLE ASPIRATION, ISTHMUS (SPECIMEN 1 OF 2, COLLECTED ON [id number]): SCANT FOLLICULAR EPITHELIUM PRESENT (BETHESDA CATEGORY I). Enid Cutter MD Pathologist, Electronic Signature (Case signed 01/01/2016) Specimen Clinical Information 1.4 x 1.1 cm isthmus thyroid nodule Source Thyroid, Fine Needle Aspiration, Isthmus, (Specimen 1 of 2, collected on 12/31/2015)   Adequacy Reason Satisfactory For Evaluation. Diagnosis CONSISTENT WITH BENIGN FOLLICULAR NODULE (BETHESDA CATEGORY II). Enid Cutter MD Pathologist, Electronic Signature (Case signed 01/01/2016) Specimen Clinical Information 1.6 x 1.6 x 1.3 cm nodule right lobe thyroid Source Thyroid, Fine Needle Aspiration, Right, (Specimen 2 of 2, collected on 12/31/2015)  The right thyroid nodule appears benign. The isthmic thyroid nodule biopsy was inconclusive due to his content  follicular epithelium. We may need to repeat biopsy of this nodule in the future, but I plan to repeat the thyroid ultrasound in 1 year from now and decide at that time.

## 2015-12-11 IMAGING — CR DG CHEST 2V
2 series · 2 of 2 positions shown · non-contrast
Comparison: 04/22/2014

CLINICAL DATA: Chest pain

EXAM:
CHEST  2 VIEW

[w chest pa]
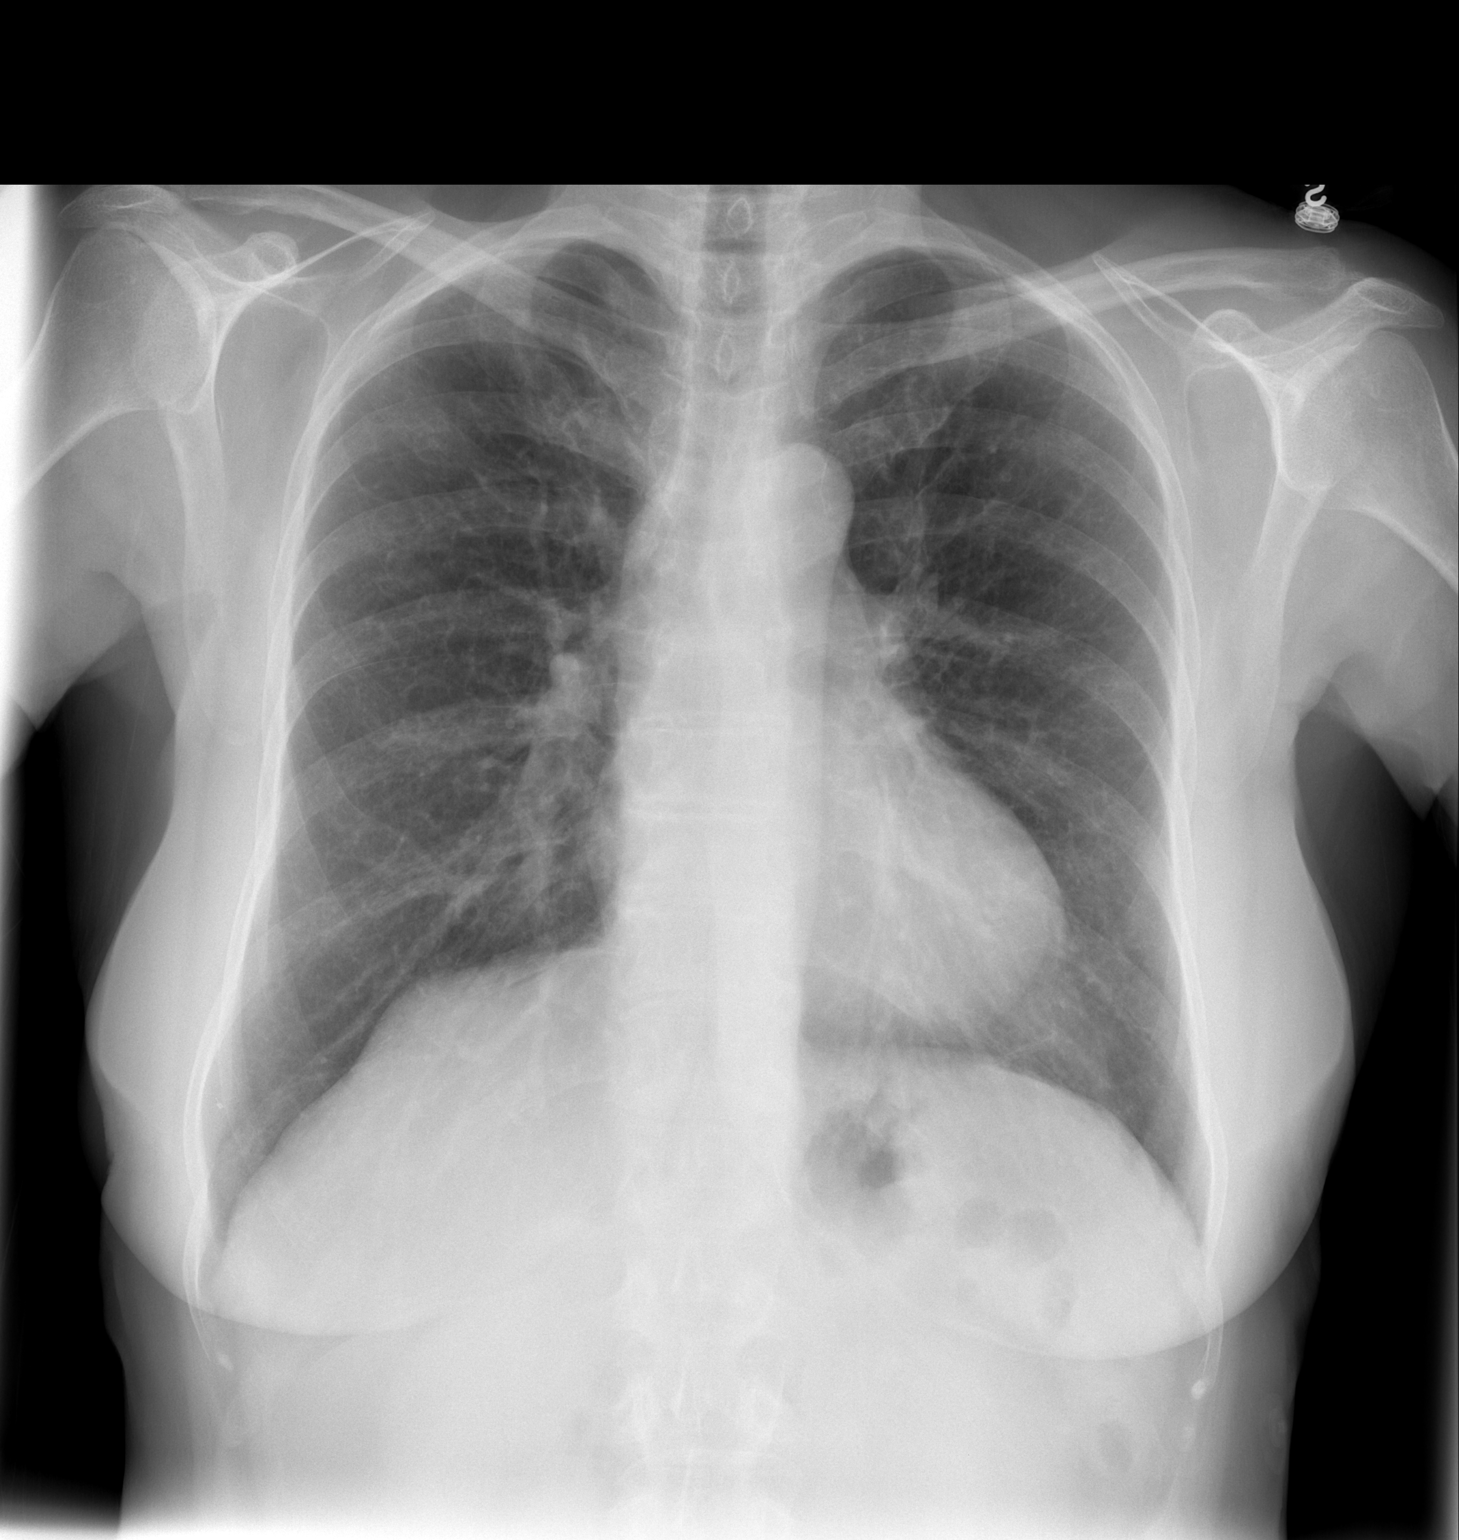

[w chest lat]
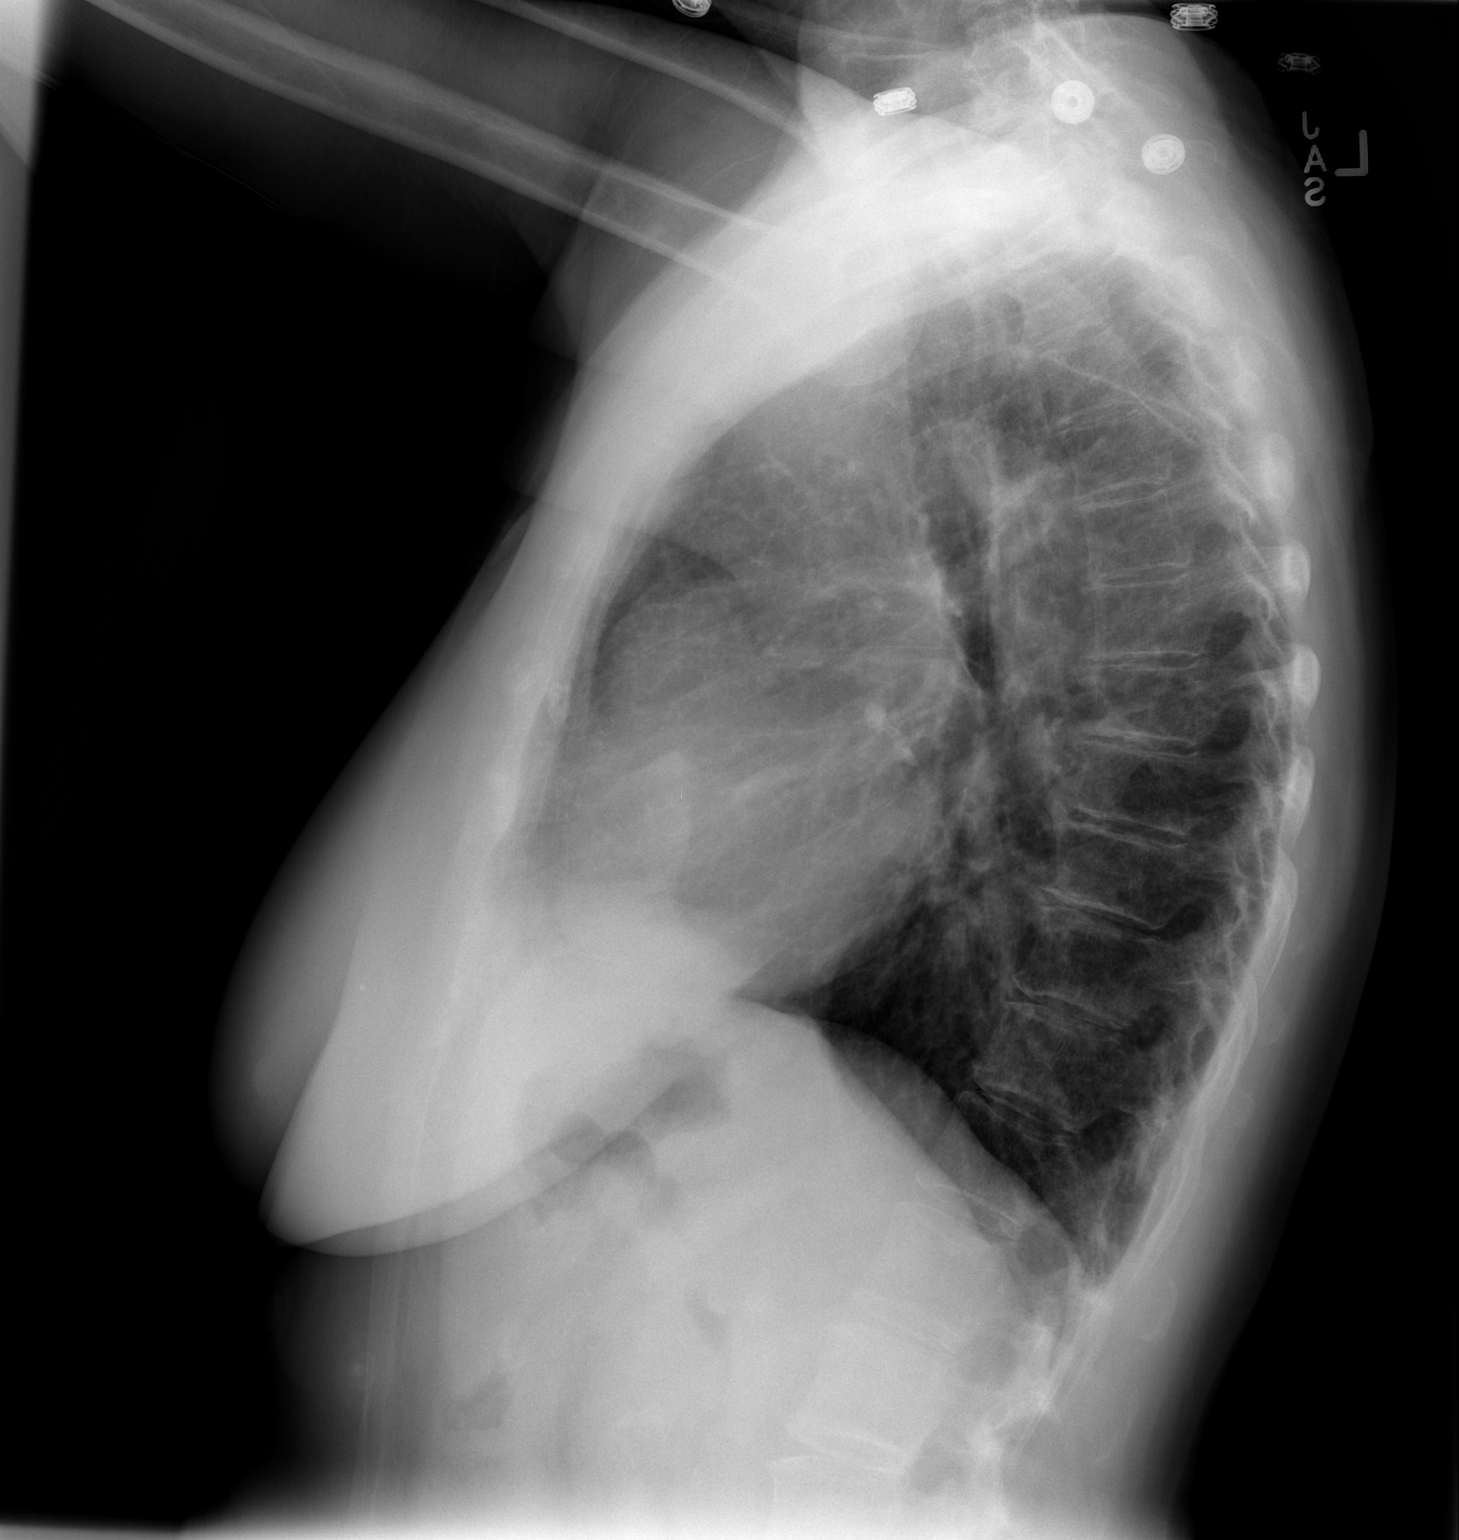

[2 of 2 positions shown; findings below may reference images not displayed]

FINDINGS: Normal heart size and mediastinal contours. No acute infiltrate or
edema. No effusion or pneumothorax. No acute osseous findings.
IMPRESSION: No active cardiopulmonary disease.

## 2015-12-16 ENCOUNTER — Encounter: Payer: Self-pay | Admitting: Internal Medicine

## 2015-12-18 ENCOUNTER — Telehealth: Payer: Self-pay | Admitting: Nurse Practitioner

## 2015-12-18 NOTE — Telephone Encounter (Signed)
Received call from Olin Hauser from [place] who states patient needs clearance to hold Plavix for 5 days prior to thyroid biopsy.  She states the procedure will be scheduled after clearance is received.  Note can be placed in epic and/or faxed to [phone].

## 2015-12-19 NOTE — Telephone Encounter (Signed)
Faxed phone note to [facility] with [place].

## 2015-12-19 NOTE — Telephone Encounter (Signed)
Ok to hold plavix for 5 days 

## 2015-12-28 ENCOUNTER — Other Ambulatory Visit: Payer: Self-pay | Admitting: Cardiovascular Disease

## 2015-12-31 ENCOUNTER — Ambulatory Visit
Admission: RE | Admit: 2015-12-31 | Discharge: 2015-12-31 | Disposition: A | Discharge status: Home / Self Care | Payer: PPO | Source: Ambulatory Visit | Attending: Internal Medicine | Admitting: Internal Medicine

## 2015-12-31 ENCOUNTER — Other Ambulatory Visit (HOSPITAL_COMMUNITY)
Admission: RE | Admit: 2015-12-31 | Discharge: 2015-12-31 | Disposition: A | Discharge status: Home / Self Care | Payer: PPO | Source: Ambulatory Visit | Attending: Interventional Radiology | Admitting: Interventional Radiology

## 2015-12-31 ENCOUNTER — Inpatient Hospital Stay: Admission: RE | Admit: 2015-12-31 | Payer: PPO | Source: Ambulatory Visit

## 2015-12-31 ENCOUNTER — Other Ambulatory Visit: Payer: Self-pay | Admitting: Internal Medicine

## 2015-12-31 DIAGNOSIS — E042 Nontoxic multinodular goiter: Secondary | ICD-10-CM | POA: Insufficient documentation

## 2015-12-31 DIAGNOSIS — E041 Nontoxic single thyroid nodule: Secondary | ICD-10-CM | POA: Diagnosis not present

## 2016-01-10 ENCOUNTER — Other Ambulatory Visit: Payer: Self-pay | Admitting: Cardiovascular Disease

## 2016-01-16 ENCOUNTER — Other Ambulatory Visit: Payer: Self-pay | Admitting: Gastroenterology

## 2016-02-02 ENCOUNTER — Encounter: Payer: Self-pay | Admitting: Gastroenterology

## 2016-02-06 ENCOUNTER — Other Ambulatory Visit: Payer: Self-pay | Admitting: Cardiovascular Disease

## 2016-02-06 NOTE — Telephone Encounter (Signed)
Sarah Hector, MD at 10/15/2015 2:00 PM  clopidogrel (PLAVIX) 75 MG tabletTake 1 tablet (75 mg total) by mouth at bedtime Assessment and Plan CAD: Recurrent symptoms not typical for angina with improvement exercise and no change in nitro 03/18/14 RCA stent with f/u cath 04/22/14 no restenosis Normal myovue 02/14/15 She is allergic to aspirin and will have to stay on plavix  [place] changed to plavix with good P2Y due to cost

## 2016-03-19 DIAGNOSIS — K219 Gastro-esophageal reflux disease without esophagitis: Secondary | ICD-10-CM | POA: Diagnosis not present

## 2016-03-19 DIAGNOSIS — R5383 Other fatigue: Secondary | ICD-10-CM | POA: Diagnosis not present

## 2016-03-19 DIAGNOSIS — E782 Mixed hyperlipidemia: Secondary | ICD-10-CM | POA: Diagnosis not present

## 2016-03-19 DIAGNOSIS — M5030 Other cervical disc degeneration, unspecified cervical region: Secondary | ICD-10-CM | POA: Diagnosis not present

## 2016-03-19 DIAGNOSIS — E538 Deficiency of other specified B group vitamins: Secondary | ICD-10-CM | POA: Diagnosis not present

## 2016-03-19 DIAGNOSIS — I700 Atherosclerosis of aorta: Secondary | ICD-10-CM | POA: Diagnosis not present

## 2016-03-19 DIAGNOSIS — M8589 Other specified disorders of bone density and structure, multiple sites: Secondary | ICD-10-CM | POA: Diagnosis not present

## 2016-03-19 DIAGNOSIS — R5381 Other malaise: Secondary | ICD-10-CM | POA: Diagnosis not present

## 2016-03-19 DIAGNOSIS — Z79899 Other long term (current) drug therapy: Secondary | ICD-10-CM | POA: Diagnosis not present

## 2016-03-19 DIAGNOSIS — I2510 Atherosclerotic heart disease of native coronary artery without angina pectoris: Secondary | ICD-10-CM | POA: Diagnosis not present

## 2016-04-04 ENCOUNTER — Other Ambulatory Visit: Payer: Self-pay | Admitting: Cardiovascular Disease

## 2016-04-14 DIAGNOSIS — Z79899 Other long term (current) drug therapy: Secondary | ICD-10-CM | POA: Diagnosis not present

## 2016-04-14 DIAGNOSIS — I2510 Atherosclerotic heart disease of native coronary artery without angina pectoris: Secondary | ICD-10-CM | POA: Diagnosis not present

## 2016-04-14 DIAGNOSIS — M8589 Other specified disorders of bone density and structure, multiple sites: Secondary | ICD-10-CM | POA: Diagnosis not present

## 2016-04-14 DIAGNOSIS — E782 Mixed hyperlipidemia: Secondary | ICD-10-CM | POA: Diagnosis not present

## 2016-05-05 ENCOUNTER — Ambulatory Visit (INDEPENDENT_AMBULATORY_CARE_PROVIDER_SITE_OTHER): Payer: PPO | Admitting: Gynecology

## 2016-05-05 ENCOUNTER — Encounter: Payer: Self-pay | Admitting: Gynecology

## 2016-05-05 VITALS — BP 120/68 | Ht 60.5 in | Wt 123.0 lb

## 2016-05-05 DIAGNOSIS — R1032 Left lower quadrant pain: Secondary | ICD-10-CM

## 2016-05-05 DIAGNOSIS — N952 Postmenopausal atrophic vaginitis: Secondary | ICD-10-CM

## 2016-05-05 DIAGNOSIS — Z1231 Encounter for screening mammogram for malignant neoplasm of breast: Secondary | ICD-10-CM | POA: Diagnosis not present

## 2016-05-05 DIAGNOSIS — M8589 Other specified disorders of bone density and structure, multiple sites: Secondary | ICD-10-CM | POA: Diagnosis not present

## 2016-05-05 DIAGNOSIS — Z01419 Encounter for gynecological examination (general) (routine) without abnormal findings: Secondary | ICD-10-CM | POA: Diagnosis not present

## 2016-05-05 DIAGNOSIS — N762 Acute vulvitis: Secondary | ICD-10-CM

## 2016-05-05 DIAGNOSIS — M8580 Other specified disorders of bone density and structure, unspecified site: Secondary | ICD-10-CM | POA: Diagnosis not present

## 2016-05-05 DIAGNOSIS — Z803 Family history of malignant neoplasm of breast: Secondary | ICD-10-CM | POA: Diagnosis not present

## 2016-05-05 LAB — WET PREP FOR TRICH, YEAST, CLUE
Clue Cells Wet Prep HPF POC: NONE SEEN
TRICH WET PREP: NONE SEEN
YEAST WET PREP: NONE SEEN

## 2016-05-05 MED ORDER — NYSTATIN-TRIAMCINOLONE 100000-0.1 UNIT/GM-% EX OINT
1.0000 | TOPICAL_OINTMENT | Freq: Every day | CUTANEOUS | 3 refills | Status: DC | PRN
Start: 2016-05-05 — End: 2016-05-06

## 2016-05-05 NOTE — Patient Instructions (Signed)
Follow up for your mammogram and bone density as scheduled.

## 2016-05-05 NOTE — Progress Notes (Signed)
    Sarah Ray 1940-10-03 [id number]        75 y.o.  G2P2002  for breast and pelvic exam.  Several issues noted below.  Past medical history,surgical history, problem list, medications, allergies, family history and social history were all reviewed and documented as reviewed in the EPIC chart.  ROS:  Performed with pertinent positives and negatives included in the history, assessment and plan.   Additional significant findings :  None   Exam: Caryn Bee assistant Vitals:   05/05/16 0943  BP: 120/68  Weight: 123 lb (55.8 kg)  Height: 5' 0.5" (1.537 m)   Body mass index is 23.63 kg/m.  General appearance:  Normal affect, orientation and appearance. Skin: Grossly normal HEENT: Without gross lesions.  No cervical or supraclavicular adenopathy. Thyroid normal.  Lungs:  Clear without wheezing, rales or rhonchi Cardiac: RR, without RMG Abdominal:  Soft, nontender, without masses, guarding, rebound, organomegaly or hernia Breasts:  Examined lying and sitting without masses, retractions, discharge or axillary adenopathy. Pelvic:  Ext/BUS/Vagina With atrophic changes.  Adnexa without masses or tenderness    Anus and perineum normal   Rectovaginal normal sphincter tone without palpated masses or tenderness.    Assessment/Plan:  75 y.o. [id number] female for breast and pelvic exam.   1. Postmenopausal/atrophic genital changes. 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  Without significant hot flushes, night sweats, vaginal dryness. Continue monitor report any issues.  2. Vulvitis. 75 y.o. [id number] female for breast and pelvic exam. intermittent vulvitis that she uses Mytrex cream intermittently. Exam shows no lesions or evidence of lichen sclerosus changes. Wet prep today is negative. Patient will continue with Mytrex cream as it seems to control her symptoms. 30 g tube with 2 refills provided. 3. Chronic left lower quadrant pain. Has been evaluated to include a negative CT scan this past year. It occurs occasionally. She is  having some issues with constipation I suspect is GI related. She'll follow up with her primary physician if this worsens or continues to be a significant issue. CT scan showed no adnexal pathology in the pelvis to suggest ovarian in origin. 4. Osteopenia. DEXA 2015 T score -2.0 FRAX 19.8%/4.8%. Significant decline from prior DEXA.  I reviewed with the patient her increased fracture risk and options for treatment. She had previously decided against treatment. I reviewed risk versus benefit of no treatment and a significant fracture versus treatment with significant side effect from the medication. I reviewed treatment options to include bisphosphonates, Prolia, Evista, Reclast. Patient has follow up bone density already scheduled she's going to follow up for this. Will rediscuss after these results are available either through my office or through her primary physician's office. 5. Mammography due now and patient has scheduled along with her bone density. SBE monthly reviewed. 6. Colonoscopy 2015. Repeat at their recommended interval. 7. Pap smear 2011. No Pap smear done today. Based on current screening guidelines with age and hysterectomy history we both agree to stop screening. 8. Health maintenance. No routine lab work done as patient reports is done elsewhere. Follow up 1 year, sooner as needed.  15 minutes of my time in excess of her breast and pelvic exam was spent in direct face to face counseling and coordination of care in regards to her problems of chronic vulvitis, chronic left lower quadrant pain and osteopenia with treatment options.    Anastasio Auerbach MD, 10:07 AM 05/05/2016

## 2016-05-06 ENCOUNTER — Telehealth: Payer: Self-pay | Admitting: *Deleted

## 2016-05-06 MED ORDER — TRIAMCINOLONE ACETONIDE 0.1 % EX CREA: 1.0000 "application " | TOPICAL_CREAM | Freq: Two times a day (BID) | CUTANEOUS | 3 refills | Status: DC

## 2016-05-06 MED ORDER — NYSTATIN 100000 UNIT/GM EX CREA: 1.0000 "application " | TOPICAL_CREAM | Freq: Two times a day (BID) | CUTANEOUS | 3 refills | Status: DC

## 2016-05-06 NOTE — Telephone Encounter (Signed)
Pt called c/o medication for mycology ointment is not covered. Per note Rx should be for mycology cream, I called pt and told her that I will sent 2 separate  once for nystatin cream and triamcinolone cream, typically it is covered this way and to call if it is not. I left on this on pt voicemail.

## 2016-05-11 ENCOUNTER — Encounter: Payer: Self-pay | Admitting: Gynecology

## 2016-05-13 ENCOUNTER — Other Ambulatory Visit: Payer: Self-pay | Admitting: Gastroenterology

## 2016-05-17 ENCOUNTER — Telehealth: Payer: Self-pay | Admitting: Gynecology

## 2016-05-17 NOTE — Telephone Encounter (Signed)
Pt informed with the below note, pt will think about it more and call back at this time.

## 2016-05-17 NOTE — Telephone Encounter (Signed)
Tell patient her most recent bone density shows relative stability at all sites except her spine where she did lose 8% from her last study. Her 10 year risk of fracture is 21% anywhere and 5.8% at her hip. We talked about treatment options before and she declined treatment. If she is interested in re-discussing treatment options then recommend office visit.

## 2016-05-18 DIAGNOSIS — M8589 Other specified disorders of bone density and structure, multiple sites: Secondary | ICD-10-CM | POA: Diagnosis not present

## 2016-05-18 DIAGNOSIS — R5381 Other malaise: Secondary | ICD-10-CM | POA: Diagnosis not present

## 2016-05-18 DIAGNOSIS — I2510 Atherosclerotic heart disease of native coronary artery without angina pectoris: Secondary | ICD-10-CM | POA: Diagnosis not present

## 2016-05-18 DIAGNOSIS — I10 Essential (primary) hypertension: Secondary | ICD-10-CM | POA: Diagnosis not present

## 2016-05-18 DIAGNOSIS — R5383 Other fatigue: Secondary | ICD-10-CM | POA: Diagnosis not present

## 2016-05-18 DIAGNOSIS — M791 Myalgia: Secondary | ICD-10-CM | POA: Diagnosis not present

## 2016-05-21 ENCOUNTER — Encounter: Payer: Self-pay | Admitting: Gynecology

## 2016-06-09 DIAGNOSIS — L821 Other seborrheic keratosis: Secondary | ICD-10-CM | POA: Diagnosis not present

## 2016-06-09 DIAGNOSIS — L814 Other melanin hyperpigmentation: Secondary | ICD-10-CM | POA: Diagnosis not present

## 2016-06-09 DIAGNOSIS — D2271 Melanocytic nevi of right lower limb, including hip: Secondary | ICD-10-CM | POA: Diagnosis not present

## 2016-06-09 DIAGNOSIS — D2262 Melanocytic nevi of left upper limb, including shoulder: Secondary | ICD-10-CM | POA: Diagnosis not present

## 2016-06-09 DIAGNOSIS — D2372 Other benign neoplasm of skin of left lower limb, including hip: Secondary | ICD-10-CM | POA: Diagnosis not present

## 2016-06-09 DIAGNOSIS — D225 Melanocytic nevi of trunk: Secondary | ICD-10-CM | POA: Diagnosis not present

## 2016-06-09 DIAGNOSIS — D2272 Melanocytic nevi of left lower limb, including hip: Secondary | ICD-10-CM | POA: Diagnosis not present

## 2016-06-09 DIAGNOSIS — D2261 Melanocytic nevi of right upper limb, including shoulder: Secondary | ICD-10-CM | POA: Diagnosis not present

## 2016-06-09 DIAGNOSIS — Z85828 Personal history of other malignant neoplasm of skin: Secondary | ICD-10-CM | POA: Diagnosis not present

## 2016-06-10 NOTE — Progress Notes (Signed)
Patient ID: Sarah Ray, female   DOB: [birth year], 75 y.o.   MRN: [id number]   75 y.o.  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 I take care of her husband [name] and she wanted to f/u with me. 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 Has not taken lisinopril  Reviewed echo from9/28/15   EF normal 55% Trivial MR Inferior wall motion normal   Seen by Dr Fuller Plan 11/15 with mild erosive esophagitis and stricture Rx protonix   9/15  P2Y 91 showing good platlet inhibition on plavix    Tends to hear temporal pulse when BP high  Seeing orthopedist for possible back injections  Still with SSCP radiating down arms Like previous MI  Except no indigestion Exercise helps pain and nitro makes no difference  Seen in ER 02/11/15 with atypical pain r/o d/c.  F/U myovue done 02/14/15 reviewed and normal with no ischemia or infarct  Had thyroid biopsy on 12/31/15:  Benign Statin changed to crestor due to myalgias on zocor   ROS: Denies fever, malais, weight loss, blurry vision, decreased visual acuity, cough, sputum, SOB, hemoptysis, pleuritic pain, palpitaitons, heartburn, abdominal pain, melena, lower extremity edema, claudication, or rash.  All other systems reviewed and negative  General: Affect appropriate Healthy:  appears stated age 62: normal Neck supple with no adenopathy JVP normal no bruits no thyromegaly Lungs  clear with no wheezing and good diaphragmatic motion Heart:  S1/S2 no murmur, no rub, gallop or click PMI normal Abdomen: benighn, BS positve, no tenderness, no AAA no bruit.  No HSM or HJR Distal pulses intact with no bruits No edema Neuro non-focal Skin warm and dry No muscular weakness   Current Outpatient Prescriptions  Medication Sig Dispense Refill  . carvedilol (COREG) 3.125 MG tablet TAKE 1 TABLET(3.125 MG) BY MOUTH TWICE DAILY 180 tablet 1  . clopidogrel (PLAVIX) 75 MG tablet TAKE 1 TABLET(75 MG) BY MOUTH AT BEDTIME 30 tablet 5  . DOCOSAHEXAENOIC ACID PO Take 1 g by mouth 2 (two) times daily.    . hydrOXYzine (VISTARIL) 25 MG capsule Take 25 mg by mouth as directed.     . methocarbamol (ROBAXIN) 500 MG tablet Take 250 mg by mouth as directed.    . nitroGLYCERIN (NITROSTAT) 0.4 MG SL tablet Place 1 tablet (0.4 mg total) under the tongue every 5 (five) minutes x 3 doses as needed for chest pain. 25 tablet 4  . nystatin cream (MYCOSTATIN) Apply 1 application topically 2 (two) times daily. 30 g 3  . pantoprazole (PROTONIX) 40 MG tablet TAKE 1 TABLET(40 MG) BY MOUTH DAILY 90 tablet 0  . pantoprazole (PROTONIX) 40 MG tablet TAKE 1 TABLET(40 MG) BY MOUTH DAILY 90 tablet 0  . rosuvastatin (CRESTOR) 5 MG tablet Take 5 mg by mouth daily.    Marland Kitchen triamcinolone cream (KENALOG) 0.1 % Apply 1 application topically 2 (two) times daily.  30 g 3   No current facility-administered medications for this visit.     Allergies  Brilinta [ticagrelor]; Aspirin; Lipitor [atorvastatin]; and Other  Electrocardiogram: 06/17/14   SR rate 82 LAD nonspecific ST changes  06/17/14   SR rate 59 normal  06/17/16  SR rate 58 normal ECG   Assessment and Plan CAD:  Recurrent symptoms not typical for angina with improvement exercise and no change in nitro 03/18/14 RCA stent with f/u cath  04/22/14 no restenosis Normal myovue 02/14/15 She is allergic to aspirin and will have to stay on plavix  [place] changed to plavix with good P2Y due to cost   Chol:  On crestor now with less myalgias check chol/LFTls today  Esophagitis:  On protonix improved    Thyroid:  Post biopsy April 2017  Will check labs today biopsy benign   Health Maint:  Flu shot given today   F/U with me in 6 months  Jenkins Rouge

## 2016-06-11 ENCOUNTER — Other Ambulatory Visit: Payer: Self-pay | Admitting: Gastroenterology

## 2016-06-17 ENCOUNTER — Encounter: Payer: Self-pay | Admitting: Cardiovascular Disease

## 2016-06-17 ENCOUNTER — Ambulatory Visit (INDEPENDENT_AMBULATORY_CARE_PROVIDER_SITE_OTHER): Payer: PPO | Admitting: Cardiovascular Disease

## 2016-06-17 VITALS — BP 120/60 | HR 60 | Ht 62.0 in | Wt 121.6 lb

## 2016-06-17 DIAGNOSIS — I2583 Coronary atherosclerosis due to lipid rich plaque: Principal | ICD-10-CM

## 2016-06-17 DIAGNOSIS — I2510 Atherosclerotic heart disease of native coronary artery without angina pectoris: Secondary | ICD-10-CM | POA: Diagnosis not present

## 2016-06-17 DIAGNOSIS — Z23 Encounter for immunization: Secondary | ICD-10-CM

## 2016-06-17 LAB — T4, FREE: Free T4: 0.9 ng/dL (ref 0.8–1.8)

## 2016-06-17 LAB — LIPID PANEL
CHOLESTEROL: 147 mg/dL (ref 125–200)
HDL: 53 mg/dL (ref >=46)
LDL Cholesterol: 78 mg/dL (ref <130)
TRIGLYCERIDES: 82 mg/dL (ref <150)
Total CHOL/HDL Ratio: 2.8 Ratio (ref <=5.0)
VLDL: 16 mg/dL (ref <30)

## 2016-06-17 LAB — TSH: TSH: 0.89 mIU/L

## 2016-06-17 LAB — HEPATIC FUNCTION PANEL
ALT: 12 U/L (ref 6–29)
AST: 17 U/L (ref 10–35)
Albumin: 4 g/dL (ref 3.6–5.1)
Alkaline Phosphatase: 54 U/L (ref 33–130)
BILIRUBIN DIRECT: 0.1 mg/dL (ref <=0.2)
BILIRUBIN INDIRECT: 0.6 mg/dL (ref 0.2–1.2)
BILIRUBIN TOTAL: 0.7 mg/dL (ref 0.2–1.2)
Total Protein: 6.4 g/dL (ref 6.1–8.1)

## 2016-06-17 NOTE — Patient Instructions (Addendum)
Medication Instructions:  Your physician recommends that you continue on your current medications as directed. Please refer to the Current Medication list given to you today.  Labwork: Your physician recommends that you have lab work today- TSH, T4, Lipid and Liver panel   Testing/Procedures: NONE  Follow-Up: Your physician wants you to follow-up in: 6 months with Dr. Johnsie Cancel. You will receive a reminder letter in the mail two months in advance. If you don't receive a letter, please call our office to schedule the follow-up appointment.   If you need a refill on your cardiac medications before your next appointment, please call your pharmacy.

## 2016-06-26 ENCOUNTER — Other Ambulatory Visit: Payer: Self-pay | Admitting: Cardiovascular Disease

## 2016-07-01 NOTE — Addendum Note (Signed)
Addended by: Derl Barrow on: 07/01/2016 11:33 AM   Modules accepted: Orders

## 2016-07-19 DIAGNOSIS — M791 Myalgia: Secondary | ICD-10-CM | POA: Diagnosis not present

## 2016-07-19 DIAGNOSIS — M8589 Other specified disorders of bone density and structure, multiple sites: Secondary | ICD-10-CM | POA: Diagnosis not present

## 2016-07-19 DIAGNOSIS — I2510 Atherosclerotic heart disease of native coronary artery without angina pectoris: Secondary | ICD-10-CM | POA: Diagnosis not present

## 2016-07-19 DIAGNOSIS — R5383 Other fatigue: Secondary | ICD-10-CM | POA: Diagnosis not present

## 2016-07-19 DIAGNOSIS — R5381 Other malaise: Secondary | ICD-10-CM | POA: Diagnosis not present

## 2016-08-10 DIAGNOSIS — H5203 Hypermetropia, bilateral: Secondary | ICD-10-CM | POA: Diagnosis not present

## 2016-08-10 DIAGNOSIS — D3132 Benign neoplasm of left choroid: Secondary | ICD-10-CM | POA: Diagnosis not present

## 2016-08-10 DIAGNOSIS — H2513 Age-related nuclear cataract, bilateral: Secondary | ICD-10-CM | POA: Diagnosis not present

## 2016-09-07 ENCOUNTER — Other Ambulatory Visit: Payer: Self-pay | Admitting: Gastroenterology

## 2016-09-14 ENCOUNTER — Other Ambulatory Visit: Payer: Self-pay | Admitting: Gastroenterology

## 2016-09-14 MED ORDER — PANTOPRAZOLE SODIUM 40 MG PO TBEC: 40.0000 mg | DELAYED_RELEASE_TABLET | Freq: Every day | ORAL | 1 refills | Status: DC

## 2016-09-14 NOTE — Telephone Encounter (Signed)
Sent in protonix for patient and called patient to inform to make sure keeps in her follow up appointment with Dr Fuller Plan

## 2016-09-27 ENCOUNTER — Telehealth: Payer: Self-pay | Admitting: Cardiovascular Disease

## 2016-09-27 ENCOUNTER — Ambulatory Visit (INDEPENDENT_AMBULATORY_CARE_PROVIDER_SITE_OTHER): Payer: PPO | Admitting: Cardiovascular Disease

## 2016-09-27 ENCOUNTER — Encounter: Payer: Self-pay | Admitting: Cardiovascular Disease

## 2016-09-27 ENCOUNTER — Other Ambulatory Visit: Payer: Self-pay | Admitting: Cardiovascular Disease

## 2016-09-27 ENCOUNTER — Encounter (INDEPENDENT_AMBULATORY_CARE_PROVIDER_SITE_OTHER): Payer: Self-pay

## 2016-09-27 VITALS — BP 150/82 | HR 69 | Ht 61.0 in | Wt 124.4 lb

## 2016-09-27 DIAGNOSIS — I2510 Atherosclerotic heart disease of native coronary artery without angina pectoris: Secondary | ICD-10-CM

## 2016-09-27 DIAGNOSIS — I2583 Coronary atherosclerosis due to lipid rich plaque: Secondary | ICD-10-CM | POA: Diagnosis not present

## 2016-09-27 DIAGNOSIS — Z01812 Encounter for preprocedural laboratory examination: Secondary | ICD-10-CM | POA: Diagnosis not present

## 2016-09-27 DIAGNOSIS — I208 Other forms of angina pectoris: Secondary | ICD-10-CM

## 2016-09-27 NOTE — Telephone Encounter (Signed)
°  New Prob   Pt c/o of Chest Pain: STAT if CP now or developed within 24 hours  1. Are you having CP right now? Yes. Pt described pain as pressure in her chest and back.  2. Are you experiencing any other symptoms (ex. SOB, nausea, vomiting, sweating)? Heaviness in legs and arms.  3. How long have you been experiencing CP? 2 weeks  4. Is your CP continuous or coming and going? Comes and goes but has been more constant in the last several days.  5. Have you taken Nitroglycerin? No ?

## 2016-09-27 NOTE — Patient Instructions (Addendum)
Medication Instructions:  Your physician recommends that you continue on your current medications as directed. Please refer to the Current Medication list given to you today.  Labwork: Your physician recommends that you have lab work today-  BMET, CBC, PT/INR  Testing/Procedures: Your physician has requested that you have a cardiac catheterization. Cardiac catheterization is used to diagnose and/or treat various heart conditions. Doctors may recommend this procedure for a number of different reasons. The most common reason is to evaluate chest pain. Chest pain can be a symptom of coronary artery disease (CAD), and cardiac catheterization can show whether plaque is narrowing or blocking your heart's arteries. This procedure is also used to evaluate the valves, as well as measure the blood flow and oxygen levels in different parts of your heart. For further information please visit [email or web address]. Please follow instruction sheet, as given.  Follow-Up: Your physician wants you to follow-up in: 6 months with Dr. Johnsie Cancel. You will receive a reminder letter in the mail two months in advance. If you don't receive a letter, please call our office to schedule the follow-up appointment.   If you need a refill on your cardiac medications before your next appointment, please call your pharmacy.

## 2016-09-27 NOTE — Progress Notes (Signed)
Patient ID: Sarah Ray, female   DOB: Feb 20, 1941, 76 y.o.   MRN: [id number]   76 y.o.  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 I take care of her husband [name] and she wanted to f/u with me. 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 Has not taken lisinopril  Reviewed echo from9/28/15   EF normal 55% Trivial MR Inferior wall motion normal   Seen by Dr Fuller Plan 11/15 with mild erosive esophagitis and stricture Rx protonix   9/15  P2Y 91 showing good platlet inhibition on plavix    Tends to hear temporal pulse when BP high  Seeing orthopedist for possible back injections  Still with SSCP radiating down arms Like previous MI  Except no indigestion Exercise helps pain and nitro makes no difference  Seen in ER 02/11/15 with atypical pain r/o d/c.  F/U myovue done 02/14/15 reviewed and normal with no ischemia or infarct  Had thyroid biopsy on 12/31/15:  Benign Statin changed to crestor due to myalgias on zocor but still complaining of heaviness in legs  She was added on to schedule today for 2 weeks of worsening chest pain. Not always exertional Radiates to neck back and arms Nitro not very helpful. Doing cardiac rehab and feels better  When she's active .  Her symptoms prior to her MI in 2015 were atypical as well   ROS: Denies fever, malais, weight loss, blurry vision, decreased visual acuity, cough, sputum, SOB,  hemoptysis, pleuritic pain, palpitaitons, heartburn, abdominal pain, melena, lower extremity edema, claudication, or rash.  All other systems reviewed and negative  General: Affect appropriate Healthy:  appears stated age 32: normal Neck supple with no adenopathy JVP normal no bruits no thyromegaly Lungs clear with no wheezing and good diaphragmatic motion Heart:  S1/S2 no murmur, no rub, gallop or click PMI normal Abdomen: benighn, BS positve, no tenderness, no AAA no bruit.  No HSM or HJR Distal pulses intact with no bruits No edema Neuro non-focal Skin warm and dry No muscular weakness   Current Outpatient Prescriptions  Medication Sig Dispense Refill  . carvedilol (COREG) 3.125 MG tablet TAKE 1 TABLET(3.125 MG) BY MOUTH TWICE DAILY 180 tablet 3  . clopidogrel (PLAVIX) 75 MG tablet TAKE 1 TABLET(75 MG) BY MOUTH AT BEDTIME 30 tablet 5  . DOCOSAHEXAENOIC ACID PO Take 1 g by mouth 2 (two) times daily.    . hydrOXYzine (VISTARIL) 25 MG capsule Take 25 mg by mouth as directed.     . methocarbamol (ROBAXIN) 500 MG tablet Take 250 mg by mouth as directed.    . nitroGLYCERIN (NITROSTAT) 0.4 MG SL tablet Place 1 tablet (0.4 mg total) under the tongue every 5 (five) minutes x 3 doses as needed for chest pain. 25 tablet 4  . nystatin cream (MYCOSTATIN) Apply 1 application topically 2 (two) times daily. 30 g 3  . pantoprazole (PROTONIX) 40  MG tablet TAKE 1 TABLET BY MOUTH EVERY DAY 90 tablet 1  . rosuvastatin (CRESTOR) 5 MG tablet Take 5 mg by mouth every other day.    . triamcinolone cream (KENALOG) 0.1 % Apply 1 application topically 2 (two) times daily. 30 g 3   No current facility-administered medications for this visit.     Allergies  Brilinta [ticagrelor]; Aspirin; Lipitor [atorvastatin]; and Other  Electrocardiogram: 06/17/14   SR rate 82 LAD nonspecific ST changes  06/17/14   SR rate 59 normal  06/17/16  SR rate 58 normal ECG 09/27/16  SR rate 60 T wave inversion 3,F    Assessment and Plan CAD:  Recurrent symptoms not typical for angina with improvement exercise and no change in nitro 03/18/14 RCA stent with f/u cath  04/22/14 no restenosis Normal myovue 02/14/15 She is allergic to aspirin and will have to stay on plavix Concern for multiple recurrences ECG today With new inferior T wave inversions in 3,F Given her personality and possible need for cervical neck Injections favor diagnostic cath to clarify issue. Risks discussed willing to proceed Lab called Set up for Dr Tamala Julian on 09/29/16    Chol:  Hold crestor see if myalgias improve may need to change to PSK 9 Esophagitis:  On protonix improved    Thyroid:  Post biopsy April 2017  Will check labs today biopsy benign   Lab called orders written discussed with Dr Tamala Julian and had him meet patient   F/U with me post cath  [facility]

## 2016-09-27 NOTE — Telephone Encounter (Signed)
I spoke with the pt and she complains of ongoing symptoms for the last few weeks that has worsened over the past few days.  The pt complains of burning in her back, pressure in chest, pain in arms from elbows to hands, leg heaviness from knees to feet.  The pt denies SOB and diaphoresis.  The pt denies symptoms at this moment but has had symptoms within the past hour and the length of symptoms vary.  The pt went to cardiac rehab this morning and was able to do all her exercises without any symptoms. The pt also said the more active she is the better she feels. The pt wonders if her symptoms are related to being inactive due to the weather. The pt said it is hard to believe that these symptoms are related to her heart but she would like to get things checked. The pt would like to rule out her heart before she follows up with orthopaedic.

## 2016-09-27 NOTE — Telephone Encounter (Signed)
Pt scheduled to see Dr Johnsie Cancel today for evaluation.

## 2016-09-28 LAB — CBC WITH DIFFERENTIAL/PLATELET
Basophils Absolute: 0 10*3/uL (ref 0.0–0.2)
Basos: 1 %
EOS (ABSOLUTE): 0.1 10*3/uL (ref 0.0–0.4)
EOS: 2 %
HEMATOCRIT: 38.1 % (ref 34.0–46.6)
Hemoglobin: 12.8 g/dL (ref 11.1–15.9)
Immature Grans (Abs): 0 10*3/uL (ref 0.0–0.1)
Immature Granulocytes: 0 %
Lymphocytes Absolute: 1 10*3/uL (ref 0.7–3.1)
Lymphs: 22 %
MCH: 32.9 pg (ref 26.6–33.0)
MCHC: 33.6 g/dL (ref 31.5–35.7)
MCV: 98 fL — ABNORMAL HIGH (ref 79–97)
MONOS ABS: 0.7 10*3/uL (ref 0.1–0.9)
Monocytes: 14 %
Neutrophils Absolute: 2.8 10*3/uL (ref 1.4–7.0)
Neutrophils: 61 %
PLATELETS: 229 10*3/uL (ref 150–379)
RBC: 3.89 x10E6/uL (ref 3.77–5.28)
RDW: 13.6 % (ref 12.3–15.4)
WBC: 4.6 10*3/uL (ref 3.4–10.8)

## 2016-09-28 LAB — BASIC METABOLIC PANEL
BUN/Creatinine Ratio: 16 (ref 12–28)
BUN: 12 mg/dL (ref 8–27)
CALCIUM: 9.7 mg/dL (ref 8.7–10.3)
CO2: 24 mmol/L (ref 18–29)
CREATININE: 0.77 mg/dL (ref 0.57–1.00)
Chloride: 102 mmol/L (ref 96–106)
GFR calc Af Amer: 86 mL/min/{1.73_m2} (ref >59)
GFR calc non Af Amer: 75 mL/min/{1.73_m2} (ref >59)
GLUCOSE: 79 mg/dL (ref 65–99)
Potassium: 3.7 mmol/L (ref 3.5–5.2)
SODIUM: 143 mmol/L (ref 134–144)

## 2016-09-28 LAB — PROTIME-INR
INR: 1.1 (ref 0.8–1.2)
Prothrombin Time: 11.3 s (ref 9.1–12.0)

## 2016-09-29 ENCOUNTER — Encounter (HOSPITAL_COMMUNITY)
Admission: RE | Disposition: A | Discharge status: Home / Self Care | Payer: Self-pay | Source: Ambulatory Visit | Attending: Interventional Cardiology

## 2016-09-29 ENCOUNTER — Ambulatory Visit (HOSPITAL_COMMUNITY)
Admission: RE | Admit: 2016-09-29 | Discharge: 2016-09-29 | Disposition: A | Discharge status: Home / Self Care | Payer: PPO | Source: Ambulatory Visit | Attending: Interventional Cardiology | Admitting: Interventional Cardiology

## 2016-09-29 DIAGNOSIS — R0789 Other chest pain: Secondary | ICD-10-CM | POA: Diagnosis not present

## 2016-09-29 DIAGNOSIS — I252 Old myocardial infarction: Secondary | ICD-10-CM | POA: Diagnosis not present

## 2016-09-29 DIAGNOSIS — I208 Other forms of angina pectoris: Secondary | ICD-10-CM

## 2016-09-29 DIAGNOSIS — I2510 Atherosclerotic heart disease of native coronary artery without angina pectoris: Secondary | ICD-10-CM | POA: Diagnosis not present

## 2016-09-29 DIAGNOSIS — K209 Esophagitis, unspecified: Secondary | ICD-10-CM | POA: Insufficient documentation

## 2016-09-29 DIAGNOSIS — R079 Chest pain, unspecified: Secondary | ICD-10-CM | POA: Diagnosis present

## 2016-09-29 DIAGNOSIS — Z955 Presence of coronary angioplasty implant and graft: Secondary | ICD-10-CM | POA: Diagnosis not present

## 2016-09-29 DIAGNOSIS — I2119 ST elevation (STEMI) myocardial infarction involving other coronary artery of inferior wall: Secondary | ICD-10-CM | POA: Diagnosis present

## 2016-09-29 DIAGNOSIS — Z7902 Long term (current) use of antithrombotics/antiplatelets: Secondary | ICD-10-CM | POA: Diagnosis not present

## 2016-09-29 DIAGNOSIS — Z9861 Coronary angioplasty status: Secondary | ICD-10-CM

## 2016-09-29 HISTORY — PX: CARDIAC CATHETERIZATION: SHX172

## 2016-09-29 SURGERY — LEFT HEART CATH AND CORONARY ANGIOGRAPHY

## 2016-09-29 MED ORDER — HEPARIN (PORCINE) IN NACL 2-0.9 UNIT/ML-% IJ SOLN
INTRAMUSCULAR | Status: DC | PRN
  Administered 2016-09-29: 1000 mL

## 2016-09-29 MED ORDER — SODIUM CHLORIDE 0.9% FLUSH: 3.0000 mL | INTRAVENOUS | Status: DC | PRN

## 2016-09-29 MED ORDER — SODIUM CHLORIDE 0.9 % WEIGHT BASED INFUSION: 1.0000 mL/kg/h | INTRAVENOUS | Status: AC

## 2016-09-29 MED ORDER — LIDOCAINE HCL (PF) 1 % IJ SOLN
INTRAMUSCULAR | Status: AC
  Filled 2016-09-29: qty 30

## 2016-09-29 MED ORDER — FENTANYL CITRATE (PF) 100 MCG/2ML IJ SOLN
INTRAMUSCULAR | Status: DC | PRN
  Administered 2016-09-29: 50 ug via INTRAVENOUS

## 2016-09-29 MED ORDER — IOPAMIDOL (ISOVUE-370) INJECTION 76%
INTRAVENOUS | Status: DC | PRN
  Administered 2016-09-29: 70 mL

## 2016-09-29 MED ORDER — ONDANSETRON HCL 4 MG/2ML IJ SOLN: 4.0000 mg | Freq: Four times a day (QID) | INTRAMUSCULAR | Status: DC | PRN

## 2016-09-29 MED ORDER — MIDAZOLAM HCL 2 MG/2ML IJ SOLN
INTRAMUSCULAR | Status: AC
  Filled 2016-09-29: qty 2

## 2016-09-29 MED ORDER — SODIUM CHLORIDE 0.9 % WEIGHT BASED INFUSION: 1.0000 mL/kg/h | INTRAVENOUS | Status: DC

## 2016-09-29 MED ORDER — OXYCODONE-ACETAMINOPHEN 5-325 MG PO TABS: 1.0000 | ORAL_TABLET | ORAL | Status: DC | PRN

## 2016-09-29 MED ORDER — SODIUM CHLORIDE 0.9 % WEIGHT BASED INFUSION
3.0000 mL/kg/h | INTRAVENOUS | Status: DC
  Administered 2016-09-29: 3 mL/kg/h via INTRAVENOUS

## 2016-09-29 MED ORDER — IOPAMIDOL (ISOVUE-370) INJECTION 76%
INTRAVENOUS | Status: AC
  Filled 2016-09-29: qty 100

## 2016-09-29 MED ORDER — HEPARIN SODIUM (PORCINE) 1000 UNIT/ML IJ SOLN
INTRAMUSCULAR | Status: DC | PRN
  Administered 2016-09-29: 3000 [IU] via INTRAVENOUS

## 2016-09-29 MED ORDER — HEPARIN SODIUM (PORCINE) 1000 UNIT/ML IJ SOLN
INTRAMUSCULAR | Status: AC
  Filled 2016-09-29: qty 1

## 2016-09-29 MED ORDER — SODIUM CHLORIDE 0.9 % IV SOLN: 250.0000 mL | INTRAVENOUS | Status: DC | PRN

## 2016-09-29 MED ORDER — ACETAMINOPHEN 325 MG PO TABS: 650.0000 mg | ORAL_TABLET | ORAL | Status: DC | PRN

## 2016-09-29 MED ORDER — MIDAZOLAM HCL 2 MG/2ML IJ SOLN
INTRAMUSCULAR | Status: DC | PRN
  Administered 2016-09-29 (×2): 1 mg via INTRAVENOUS

## 2016-09-29 MED ORDER — VERAPAMIL HCL 2.5 MG/ML IV SOLN
INTRAVENOUS | Status: AC
  Filled 2016-09-29: qty 2

## 2016-09-29 MED ORDER — SODIUM CHLORIDE 0.9% FLUSH: 3.0000 mL | Freq: Two times a day (BID) | INTRAVENOUS | Status: DC

## 2016-09-29 MED ORDER — LIDOCAINE HCL (PF) 1 % IJ SOLN
INTRAMUSCULAR | Status: DC | PRN
  Administered 2016-09-29: 2 mL

## 2016-09-29 MED ORDER — FENTANYL CITRATE (PF) 100 MCG/2ML IJ SOLN
INTRAMUSCULAR | Status: AC
  Filled 2016-09-29: qty 2

## 2016-09-29 MED ORDER — HEPARIN (PORCINE) IN NACL 2-0.9 UNIT/ML-% IJ SOLN
INTRAMUSCULAR | Status: AC
  Filled 2016-09-29: qty 500

## 2016-09-29 MED ORDER — VERAPAMIL HCL 2.5 MG/ML IV SOLN
INTRAVENOUS | Status: DC | PRN
  Administered 2016-09-29: 10 mL via INTRA_ARTERIAL

## 2016-09-29 SURGICAL SUPPLY — 10 items
CATH INFINITI 5 FR JL3.5 (CATHETERS) ×2 IMPLANT
CATH INFINITI JR4 5F (CATHETERS) ×2 IMPLANT
DEVICE RAD COMP TR BAND LRG (VASCULAR PRODUCTS) ×2 IMPLANT
GLIDESHEATH SLEND A-KIT 6F 22G (SHEATH) ×2 IMPLANT
GUIDEWIRE INQWIRE 1.5J.035X260 (WIRE) IMPLANT
INQWIRE 1.5J .035X260CM (WIRE) ×3
KIT HEART LEFT (KITS) ×3 IMPLANT
PACK CARDIAC CATHETERIZATION (CUSTOM PROCEDURE TRAY) ×3 IMPLANT
TRANSDUCER W/STOPCOCK (MISCELLANEOUS) ×3 IMPLANT
TUBING CIL FLEX 10 FLL-RA (TUBING) ×3 IMPLANT

## 2016-09-29 NOTE — CV Procedure (Signed)
   Widely patent stent in the mid RCA.  40-50% proximal LAD. 50% second diagonal ostial stenosis. 30-40% mid circumflex stenosis.  No significant obstructive disease is noted.  Low normal LV systolic function. Normal LVEDP.

## 2016-09-29 NOTE — H&P (View-Only) (Signed)
Patient ID: Sarah Ray, female   DOB: 1941/03/09, 76 y.o.   MRN: [id number]   76 y.o.  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 I take care of her husband Sarah Ray and she wanted to f/u with me. 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 Has not taken lisinopril  Reviewed echo from9/28/15   EF normal 55% Trivial MR Inferior wall motion normal   Seen by Dr Fuller Plan 11/15 with mild erosive esophagitis and stricture Rx protonix   9/15  P2Y 91 showing good platlet inhibition on plavix    Tends to hear temporal pulse when BP high  Seeing orthopedist for possible back injections  Still with SSCP radiating down arms Like previous MI  Except no indigestion Exercise helps pain and nitro makes no difference  Seen in ER 02/11/15 with atypical pain r/o d/c.  F/U myovue done 02/14/15 reviewed and normal with no ischemia or infarct  Had thyroid biopsy on 12/31/15:  Benign Statin changed to crestor due to myalgias on zocor but still complaining of heaviness in legs  She was added on to schedule today for 2 weeks of worsening chest pain. Not always exertional Radiates to neck back and arms Nitro not very helpful. Doing cardiac rehab and feels better  When she's active .  Her symptoms prior to her MI in 2015 were atypical as well   ROS: Denies fever, malais, weight loss, blurry vision, decreased visual acuity, cough, sputum, SOB,  hemoptysis, pleuritic pain, palpitaitons, heartburn, abdominal pain, melena, lower extremity edema, claudication, or rash.  All other systems reviewed and negative  General: Affect appropriate Healthy:  appears stated age 76: normal Neck supple with no adenopathy JVP normal no bruits no thyromegaly Lungs clear with no wheezing and good diaphragmatic motion Heart:  S1/S2 no murmur, no rub, gallop or click PMI normal Abdomen: benighn, BS positve, no tenderness, no AAA no bruit.  No HSM or HJR Distal pulses intact with no bruits No edema Neuro non-focal Skin warm and dry No muscular weakness   Current Outpatient Prescriptions  Medication Sig Dispense Refill  . carvedilol (COREG) 3.125 MG tablet TAKE 1 TABLET(3.125 MG) BY MOUTH TWICE DAILY 180 tablet 3  . clopidogrel (PLAVIX) 75 MG tablet TAKE 1 TABLET(75 MG) BY MOUTH AT BEDTIME 30 tablet 5  . DOCOSAHEXAENOIC ACID PO Take 1 g by mouth 2 (two) times daily.    . hydrOXYzine (VISTARIL) 25 MG capsule Take 25 mg by mouth as directed.     . methocarbamol (ROBAXIN) 500 MG tablet Take 250 mg by mouth as directed.    . nitroGLYCERIN (NITROSTAT) 0.4 MG SL tablet Place 1 tablet (0.4 mg total) under the tongue every 5 (five) minutes x 3 doses as needed for chest pain. 25 tablet 4  . nystatin cream (MYCOSTATIN) Apply 1 application topically 2 (two) times daily. 30 g 3  . pantoprazole (PROTONIX) 40  MG tablet TAKE 1 TABLET BY MOUTH EVERY DAY 90 tablet 1  . rosuvastatin (CRESTOR) 5 MG tablet Take 5 mg by mouth every other day.    . triamcinolone cream (KENALOG) 0.1 % Apply 1 application topically 2 (two) times daily. 30 g 3   No current facility-administered medications for this visit.     Allergies  Brilinta [ticagrelor]; Aspirin; Lipitor [atorvastatin]; and Other  Electrocardiogram: 06/17/14   SR rate 82 LAD nonspecific ST changes  06/17/14   SR rate 59 normal  06/17/16  SR rate 58 normal ECG 09/27/16  SR rate 60 T wave inversion 3,F    Assessment and Plan CAD:  Recurrent symptoms not typical for angina with improvement exercise and no change in nitro 03/18/14 RCA stent with f/u cath  04/22/14 no restenosis Normal myovue 02/14/15 She is allergic to aspirin and will have to stay on plavix Concern for multiple recurrences ECG today With new inferior T wave inversions in 3,F Given her personality and possible need for cervical neck Injections favor diagnostic cath to clarify issue. Risks discussed willing to proceed Lab called Set up for Dr Tamala Julian on 09/29/16    Chol:  Hold crestor see if myalgias improve may need to change to PSK 9 Esophagitis:  On protonix improved    Thyroid:  Post biopsy April 2017  Will check labs today biopsy benign   Lab called orders written discussed with Dr Tamala Julian and had him meet patient   F/U with me post cath  [facility]

## 2016-09-29 NOTE — Interval H&P Note (Signed)
Cath Lab Visit (complete for each Cath Lab visit)  Clinical Evaluation Leading to the Procedure:   ACS: No.  Non-ACS:    Anginal Classification: CCS III  Anti-ischemic medical therapy: Minimal Therapy (1 class of medications)  Non-Invasive Test Results: No non-invasive testing performed  Prior CABG: No previous CABG      History and Physical Interval Note:  09/29/2016 4:58 PM  Sarah Ray  has presented today for surgery, with the diagnosis of cp  The various methods of treatment have been discussed with the patient and family. After consideration of risks, benefits and other options for treatment, the patient has consented to  Procedure(s): Left Heart Cath and Coronary Angiography (N/A) as a surgical intervention .  The patient's history has been reviewed, patient examined, no change in status, stable for surgery.  I have reviewed the patient's chart and labs.  Questions were answered to the patient's satisfaction.     Belva Crome III

## 2016-09-29 NOTE — Discharge Instructions (Signed)

## 2016-09-30 ENCOUNTER — Encounter (HOSPITAL_COMMUNITY): Payer: Self-pay | Admitting: Interventional Cardiology

## 2016-10-01 ENCOUNTER — Telehealth: Payer: Self-pay | Admitting: Cardiovascular Disease

## 2016-10-01 NOTE — Telephone Encounter (Signed)
Patient can see me in march. Stay off statin for 3 more weeks see if her muscle aches / pains improve If not should be back on it Ok to get injection from orthopedic doctor

## 2016-10-01 NOTE — Telephone Encounter (Signed)
New Message  Pt voiced had a call and not sure if it was [employer] or insuranance company stating she was approved to have pictures of her heart.   Pt calling to see if nurse can check with MD-Nishan.  Please f/u

## 2016-10-01 NOTE — Telephone Encounter (Signed)
Pt is calling to follow-up with Dr Johnsie Cancel and RN about her recent cath report from 1/10, and statin therapy.  Pt also asking when she should follow back up in the clinic with Dr Johnsie Cancel, post cath on 1/10.   Pt states that she had a cath on 1/10, as advised by Dr Johnsie Cancel.  Pt states this was done as a work-up piece to aid in helping her obtain clearance for an Orthopedic issue, which will require injections to treat.  Pt states that Dr Johnsie Cancel stopped her Statin prior to her cath, and advised that starting this back would be based on her cath report.  Pt states she is "thrilled to be off of a statin" but will follow Dr Kyla Balzarine recommendations if he wants her to start back taking this.   Pt also states that she is already scheduled to see Dr Johnsie Cancel on 3/21, as this was previously scheduled months ago.  Pt wanted to know if Dr Johnsie Cancel wanted to see her sooner, for post-cath follow-up, or just follow-up as already scheduled for 3/21.   Informed the pt that Dr Johnsie Cancel and RN are out of the office today but I will route this message to both parties to review, advise, and follow-up with the pt thereafter.   Pt verbalized understanding and agrees with this plan.

## 2016-10-04 NOTE — Telephone Encounter (Signed)
Pt advised, verbalized understanding. Pt will call in about 3 weeks to let Dr Johnsie Cancel know if muscle aches/pains improved off statin.

## 2016-10-10 ENCOUNTER — Other Ambulatory Visit: Payer: Self-pay | Admitting: Cardiovascular Disease

## 2016-10-16 DIAGNOSIS — M50823 Other cervical disc disorders at C6-C7 level: Secondary | ICD-10-CM | POA: Diagnosis not present

## 2016-10-16 DIAGNOSIS — M542 Cervicalgia: Secondary | ICD-10-CM | POA: Diagnosis not present

## 2016-10-16 DIAGNOSIS — M50822 Other cervical disc disorders at C5-C6 level: Secondary | ICD-10-CM | POA: Diagnosis not present

## 2016-10-24 DIAGNOSIS — S0191XA Laceration without foreign body of unspecified part of head, initial encounter: Secondary | ICD-10-CM | POA: Diagnosis not present

## 2016-10-24 DIAGNOSIS — S0091XA Abrasion of unspecified part of head, initial encounter: Secondary | ICD-10-CM | POA: Diagnosis not present

## 2016-10-24 DIAGNOSIS — W19XXXA Unspecified fall, initial encounter: Secondary | ICD-10-CM | POA: Diagnosis not present

## 2016-10-24 DIAGNOSIS — R51 Headache: Secondary | ICD-10-CM | POA: Diagnosis not present

## 2016-10-24 DIAGNOSIS — R55 Syncope and collapse: Secondary | ICD-10-CM | POA: Diagnosis not present

## 2016-10-24 DIAGNOSIS — R197 Diarrhea, unspecified: Secondary | ICD-10-CM | POA: Diagnosis not present

## 2016-10-24 DIAGNOSIS — S0990XA Unspecified injury of head, initial encounter: Secondary | ICD-10-CM | POA: Diagnosis not present

## 2016-10-24 DIAGNOSIS — R531 Weakness: Secondary | ICD-10-CM | POA: Diagnosis not present

## 2016-10-24 DIAGNOSIS — R1110 Vomiting, unspecified: Secondary | ICD-10-CM | POA: Diagnosis not present

## 2016-10-24 DIAGNOSIS — R112 Nausea with vomiting, unspecified: Secondary | ICD-10-CM | POA: Diagnosis not present

## 2016-10-26 ENCOUNTER — Telehealth: Payer: Self-pay | Admitting: Cardiovascular Disease

## 2016-10-26 NOTE — Telephone Encounter (Signed)
Ok to take crestor qod Symptoms not from BP meds

## 2016-10-26 NOTE — Telephone Encounter (Signed)
Called patient back. Patient stated her muscle aches and pains have not improve with being off Crestor. Informed patient according to last phone note that Dr. Johnsie Cancel recommended she should be back on her Crestor. Patient stated she would like to start back, but wants to take it every other day. Patient also wanted to know if any of her symptoms she has been having are from her BP medications. Patient stated ever since her heart attack in August, that she has been having heaviness in her BLE, ringing in her ears, her back gets this burning sensation that radiates to her chest and down her arms. Patient's orthopedic doctor has patient seeing a neurologist. Encouraged patient to keep her neurology appointment, that her symptoms could be neurological. Will forward to Dr. Johnsie Cancel for advisement.

## 2016-10-26 NOTE — Telephone Encounter (Signed)
New message    Pt calling stating she was supposed to call and speak to the nurse about the medication she was taken off of.

## 2016-10-27 ENCOUNTER — Ambulatory Visit (INDEPENDENT_AMBULATORY_CARE_PROVIDER_SITE_OTHER): Payer: PPO | Admitting: Gastroenterology

## 2016-10-27 ENCOUNTER — Encounter: Payer: Self-pay | Admitting: Gastroenterology

## 2016-10-27 VITALS — Ht 60.24 in | Wt 125.2 lb

## 2016-10-27 DIAGNOSIS — Z8719 Personal history of other diseases of the digestive system: Secondary | ICD-10-CM | POA: Diagnosis not present

## 2016-10-27 DIAGNOSIS — K219 Gastro-esophageal reflux disease without esophagitis: Secondary | ICD-10-CM

## 2016-10-27 DIAGNOSIS — Z86010 Personal history of colonic polyps: Secondary | ICD-10-CM | POA: Diagnosis not present

## 2016-10-27 DIAGNOSIS — R112 Nausea with vomiting, unspecified: Secondary | ICD-10-CM | POA: Diagnosis not present

## 2016-10-27 MED ORDER — ROSUVASTATIN CALCIUM 5 MG PO TABS: 5.0000 mg | ORAL_TABLET | ORAL | 3 refills | Status: DC

## 2016-10-27 MED ORDER — PANTOPRAZOLE SODIUM 40 MG PO TBEC: 40.0000 mg | DELAYED_RELEASE_TABLET | Freq: Every day | ORAL | 3 refills | Status: DC

## 2016-10-27 NOTE — Patient Instructions (Signed)
We have sent the following medications to your pharmacy for you to pick up at your convenience: Protonix.   Thank you for choosing me and [facility].  [name]. Dagoberto Ligas., MD., Marval Regal

## 2016-10-27 NOTE — Progress Notes (Signed)
    History of Present Illness: This is a 76 year old female returning for follow-up of GERD with a history of an esophageal stricture. Her reflux symptoms are under excellent control on daily pantoprazole.  She relates the acute onset of nausea, vomiting and diarrhea that subsided within 24 hours. Her symptoms began acutely on Saturday night and unfortunately she passed out and hit her head sustaining a small laceration. She was evaluated in [facility] ED and was hydrated and her laceration was closed with one staple. She relates that dehydration was felt to be the cause of her syncopal episode. She was sent home from the ED. All her GI symptoms have resolved.  Current Medications, Allergies, Past Medical History, Past Surgical History, Family History and Social History were reviewed in [employer] record.  Physical Exam: General: Well developed, well nourished, no acute distress Head: Normocephalic and atraumatic Eyes:  sclerae anicteric, EOMI Ears: Normal auditory acuity Mouth: No deformity or lesions Lungs: Clear throughout to auscultation Heart: Regular rate and rhythm; no murmurs, rubs or bruits Abdomen: Soft, non tender and non distended. No masses, hepatosplenomegaly or hernias noted. Normal Bowel sounds Musculoskeletal: Symmetrical with no gross deformities  Pulses:  Normal pulses noted Extremities: No clubbing, cyanosis, edema or deformities noted Neurological: Alert oriented x 4, grossly nonfocal Psychological:  Alert and cooperative. Normal mood and affect  Assessment and Recommendations:  1. GERD with a history of an esophageal stricture. Continue pantoprazole 40 mg daily long-term. Follow all standard antireflux measures  2. N/V/D, acute infectious process, resolved.   3. Personal history of adenomatous colon polyps. Last colonoscopy in April 2015 was normal. Consider repeat surveillance colonoscopy in April 2015 however she will be age 48 and  with her last colonoscopy not showing polyps may not be necessary to undergo another surveillance colonoscopy.

## 2016-10-27 NOTE — Telephone Encounter (Signed)
Left message for patient to call back  

## 2016-10-27 NOTE — Telephone Encounter (Signed)
Called patient back about Dr. Nishan's recommendations. Patient verbalized understanding.  

## 2016-10-27 NOTE — Telephone Encounter (Signed)
Sarah Ray is returning your call . Thanks

## 2016-11-09 ENCOUNTER — Ambulatory Visit (INDEPENDENT_AMBULATORY_CARE_PROVIDER_SITE_OTHER): Payer: PPO | Admitting: Neurology

## 2016-11-09 ENCOUNTER — Encounter: Payer: Self-pay | Admitting: Neurology

## 2016-11-09 VITALS — BP 155/85 | HR 67 | Ht 60.24 in | Wt 124.0 lb

## 2016-11-09 DIAGNOSIS — R202 Paresthesia of skin: Secondary | ICD-10-CM

## 2016-11-09 DIAGNOSIS — E538 Deficiency of other specified B group vitamins: Secondary | ICD-10-CM | POA: Diagnosis not present

## 2016-11-09 NOTE — Progress Notes (Signed)
Reason for visit: Paresthesias  Referring physician: Dr. Gigi Gin is a 76 y.o. female  History of present illness:  Sarah Ray is a 76 year old right-handed white female with a history of a myocardial infarction that occurred in June 2015. The patient had a stent placement, and seemed to recover quite well following the procedure. She was a symptomatic for one month, but on 04/21/2014 she began having burning sensations that started in the back of the neck and went down into the thoracic area, with chest pressure sensations and burning discomfort that went around the chest anteriorly. The patient also got tingling sensations and heavy sensations of both arms and both legs that would come on together. The events may last for several hours. The events are continuing and are almost daily in nature. The patient generally feels better in the morning and she is able to remain quite active, she exercises on a regular basis without difficulty. In the evening hours when she is inactive, she is more likely to have these events. She believes that there may be an association with neck flexion while she is reading that may bring on an episode. The patient never gets to the point where she is unable to ambulate, she does not report any bowel or bladder control issues. She is not having falls. She reports no paresthesias on the head or face, she denies any vision changes, speech changes, blackout episodes, or confusion. The patient denies headaches, she may have some tinnitus at times. The patient has undergone MRI evaluation of the cervical spine that was brought for my review. This does show some areas of spinal stenosis without spinal cord compression. There is a disc protrusion at the C4-5, C 5-6, and C6-7 levels. The C7 nerve roots and the right C6 nerve root may be at risk for compression. There is nothing on the MRI that would explain her current symptomatology. The patient is sent to this office for  an evaluation.  Past Medical History:  Diagnosis Date  . Adenomatous polyps 07/1997  . Breast fibroadenoma   . CAD (coronary artery disease) 03/18/14   STEMI- stent to RCA and nonobstructive disease LAD and LCX  . External hemorrhoids   . GERD (gastroesophageal reflux disease)    LA Class Grade C Erosive esophagitis  . Hiatal hernia   . HTN (hypertension)   . Hyperlipidemia   . Internal hemorrhoids   . Osteopenia 01/2014    T score -2.0. FRAX 19.8%/4.8% with statistically significant decrease at measured sites  . Peptic stricture of esophagus   . Skin cancer 2014   "forehead"  . ST elevation myocardial infarction (STEMI) involving right coronary artery in recovery phase ([place]) 03/18/14    Past Surgical History:  Procedure Laterality Date  . ABDOMINAL HYSTERECTOMY  1977   Leiomyoma  . BREAST CYST ASPIRATION Right ~ 2011  . BUNIONECTOMY Bilateral 04/04/2013  . CARDIAC CATHETERIZATION N/A 09/29/2016   Procedure: Left Heart Cath and Coronary Angiography;  Surgeon: Belva Crome, MD;  Location: [facility];  Service: Cardiovascular;  Laterality: N/A;  . CORONARY ANGIOPLASTY WITH STENT PLACEMENT  03/18/14   Xience DES to totally occl RCA, residual non obstructive disease to LAD and LCX  . ESOPHAGOGASTRODUODENOSCOPY (EGD) WITH ESOPHAGEAL DILATION  2000's X 1  . LEFT HEART CATH N/A 03/18/2014   Procedure: LEFT HEART CATH;  Surgeon: Blane Ohara, MD;  Location: [facility];  Service: Cardiovascular;  Laterality: N/A;  . LEFT  HEART CATHETERIZATION WITH CORONARY ANGIOGRAM N/A 04/22/2014   Procedure: LEFT HEART CATHETERIZATION WITH CORONARY ANGIOGRAM;  Surgeon: Blane Ohara, MD;  Location[facility] C[facility];  Service: Cardiovascular;  Laterality: N/A;  . SKIN CANCER EXCISION  2014  . THYROID CYST EXCISION  1970's  . TONSILLECTOMY AND ADENOIDECTOMY  ~ 1950    Family History  Problem Relation Age of Onset  . Colon polyps Mother   . Colon cancer Maternal Aunt   . Breast cancer  Maternal Aunt     Age 58  . Breast cancer Daughter     Age 73  . Heart disease Brother     irregular heart beat    Social history:  reports that she has never smoked. She has never used smokeless tobacco. She reports that she does not drink alcohol or use drugs.  Medications:  Prior to Admission medications   Medication Sig Start Date End Date Taking? Authorizing Provider  b complex vitamins tablet Take 1 tablet by mouth daily.   Yes Historical Provider, MD  CALCIUM-VITAMIN D PO Take 1 tablet by mouth daily.   Yes Historical Provider, MD  carvedilol (COREG) 3.125 MG tablet TAKE 1 TABLET(3.125 MG) BY MOUTH TWICE DAILY 06/28/16  Yes [name], MD  clopidogrel (PLAVIX) 75 MG tablet TAKE 1 TABLET(75 MG) BY MOUTH AT BEDTIME 10/11/16  Yes [name], MD  methocarbamol (ROBAXIN) 500 MG tablet Take 250 mg by mouth daily as needed for muscle spasms.    Yes Historical Provider, MD  nitroGLYCERIN (NITROSTAT) 0.4 MG SL tablet Place 1 tablet (0.4 mg total) under the tongue every 5 (five) minutes x 3 doses as needed for chest pain. 03/20/14  Yes Isaiah Serge, NP  nystatin cream (MYCOSTATIN) Apply 1 application topically 2 (two) times daily. Patient taking differently: Apply 1 application topically 2 (two) times daily as needed (rash).  05/06/16  Yes Anastasio Auerbach, MD  Omega-3 Fatty Acids (FISH OIL PO) Take 1 capsule by mouth daily.   Yes Historical Provider, MD  pantoprazole (PROTONIX) 40 MG tablet Take 1 tablet (40 mg total) by mouth daily. 10/27/16  Yes Ladene Artist, MD  rosuvastatin (CRESTOR) 5 MG tablet Take 1 tablet (5 mg total) by mouth every other day. 10/27/16 01/25/17 Yes [name], MD  sodium chloride (OCEAN) 0.65 % SOLN nasal spray Place 1 spray into both nostrils 2 (two) times daily.   Yes Historical Provider, MD  triamcinolone cream (KENALOG) 0.1 % Apply 1 application topically 2 (two) times daily. Patient taking differently: Apply 1 application topically 2 (two) times daily as  needed (rash).  05/06/16  Yes Anastasio Auerbach, MD      Allergies  Allergen Reactions  . Brilinta [Ticagrelor] Shortness Of Breath and Other (See Comments)    Caused chest pains  . Naprosyn [Naproxen] Hives  . Aspirin Hives, Diarrhea and Swelling    Benadryl stops reaction  . Lipitor [Atorvastatin] Other (See Comments)    Myalgias and leg heaviness   . Other Other (See Comments)    Very sensitive to all meds    ROS:  Out of a complete 14 system review of symptoms, the patient complains only of the following symptoms, and all other reviewed systems are negative.  Ringing in the ears Skin rash, itching Constipation Easy bruising, easy bleeding Achy muscles Runny nose, skin sensitivity Numbness, dizziness Restless legs  Blood pressure (!) 155/85, pulse 67, height 5' 0.24" (1.53 m), weight 124 lb (56.2 kg), SpO2  98 %.  Physical Exam  General: The patient is alert and cooperative at the time of the examination.  Eyes: Pupils are equal, round, and reactive to light. Discs are flat bilaterally.  Neck: The neck is supple, no carotid bruits are noted.  Respiratory: The respiratory examination is clear.  Cardiovascular: The cardiovascular examination reveals a regular rate and rhythm, no obvious murmurs or rubs are noted.  Neuromuscular: Range of movement of the cervical spine is full.  Skin: Extremities are without significant edema.  Neurologic Exam  Mental status: The patient is alert and oriented x 3 at the time of the examination. The patient has apparent normal recent and remote memory, with an apparently normal attention span and concentration ability.  Cranial nerves: Facial symmetry is present. There is good sensation of the face to pinprick and soft touch bilaterally. The strength of the facial muscles and the muscles to head turning and shoulder shrug are normal bilaterally. Speech is well enunciated, no aphasia or dysarthria is noted. Extraocular movements are  full. Visual fields are full. The tongue is midline, and the patient has symmetric elevation of the soft palate. No obvious hearing deficits are noted.  Motor: The motor testing reveals 5 over 5 strength of all 4 extremities. Good symmetric motor tone is noted throughout.  Sensory: Sensory testing is intact to pinprick, soft touch, vibration sensation, and position sense on all 4 extremities. No evidence of extinction is noted.  Coordination: Cerebellar testing reveals good finger-nose-finger and heel-to-shin bilaterally.  Gait and station: Gait is normal. Tandem gait is normal. Romberg is negative. No drift is seen.  Reflexes: Deep tendon reflexes are symmetric and normal bilaterally. Toes are downgoing bilaterally.   Assessment/Plan:  1. Intermittent paresthesias, heavy sensations of extremities  The clinical examination today is completely normal. The patient reports intermittent episodes that are almost daily of sensory symptoms that affect all 4 extremities with burning sensations in the neck and thoracic area and around the chest. The symptoms appear to be referable to the cervical spinal cord, but there is no evidence of compression of the cord. The possibility of intermittent ischemic events should be considered, but the etiology of the above events is unknown. The patient will be sent for blood work today, she will be sent for CT angiogram of the head and neck to evaluate for significant atherosclerosis of the vertebral arteries or to look for the presence of a dural AVM. I suspect the etiology of her symptoms will not be delineated.  Sarah Alexanders MD 11/09/2016 9:49 AM  [facility] [address] [place], [place] [address]  Phone [phone] Fax [phone]

## 2016-11-09 NOTE — Patient Instructions (Signed)
   WE will check blood work today and get CTA of the head and neck.

## 2016-11-11 LAB — PAN-ANCA

## 2016-11-11 LAB — ANGIOTENSIN CONVERTING ENZYME: Angio Convert Enzyme: 60 U/L (ref 14–82)

## 2016-11-11 LAB — SEDIMENTATION RATE: SED RATE: 2 mm/h (ref 0–40)

## 2016-11-11 LAB — B. BURGDORFI ANTIBODIES: Lyme IgG/IgM Ab: <0.91 {ISR} (ref 0.00–0.90)

## 2016-11-11 LAB — ANA W/REFLEX: Anti Nuclear Antibody(ANA): NEGATIVE

## 2016-11-11 LAB — VITAMIN B12: Vitamin B-12: 492 pg/mL (ref 232–1245)

## 2016-11-30 NOTE — Progress Notes (Signed)
Patient ID: Sarah Ray, female   DOB: 1941-01-29, 76 y.o.   MRN: [id number]   76 y.o.  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  Had single vessel disease with mid RCA occlusion No left sided disease no left to right collaterals Excellent angiographic result EF in normal range  Has had angioedema with aspirin  Readmitted cath 04/22/14    Dr Burt Knack with no restenosis Turned out to be cervical neck issue Brillinta caused dyspnea and now on plavix. Seen by Dr Fuller Plan 11/15 with mild erosive esophagitis and stricture Rx protonix   9/15  P2Y 91 showing good platlet inhibition on plavix  Had thyroid biopsy on 12/31/15:  Benign Statin changed to crestor due to myalgias on zocor but still complaining of heaviness in legs Taking crestor qod now.   Had another cath for chest pain 09/29/16 Personally reviewed cath films No hemodynamically significant symptoms  Conclusion    Widely patent mid RCA stent. 40-50% distal RCA narrowing.  40-50% eccentric proximal LAD narrowing. 50-70% second diagonal narrowing.  40% mid circumflex narrowing.  Low normal LV systolic function.  No significant coronary obstruction is noted.  RECOMMENDATIONS:   Continue current management strategy.  Current anatomy would not support ischemia as a cause of her atypical chest pain.   Since cath has seen Dr Jannifer Franklin for the pain in her shoulders and ? Cervical spine issues He started her on gabapentin    ROS: Denies fever, malais, weight loss, blurry vision, decreased visual acuity, cough, sputum, SOB, hemoptysis, pleuritic pain, palpitaitons, heartburn, abdominal pain, melena, lower extremity edema, claudication, or rash.  All other systems reviewed and negative  General: Affect appropriate Healthy:  appears stated age 76: normal Neck supple with no adenopathy JVP normal no bruits no thyromegaly Lungs clear with no wheezing and good diaphragmatic motion Heart:  S1/S2 no murmur, no rub, gallop or  click PMI normal Abdomen: benighn, BS positve, no tenderness, no AAA no bruit.  No HSM or HJR Distal pulses intact with no bruits No edema Neuro non-focal Skin warm and dry No muscular weakness   Current Outpatient Prescriptions  Medication Sig Dispense Refill  . b complex vitamins tablet Take 1 tablet by mouth daily.    Marland Kitchen CALCIUM-VITAMIN D PO Take 1 tablet by mouth daily.    . carvedilol (COREG) 3.125 MG tablet TAKE 1 TABLET(3.125 MG) BY MOUTH TWICE DAILY 180 tablet 3  . clopidogrel (PLAVIX) 75 MG tablet TAKE 1 TABLET(75 MG) BY MOUTH AT BEDTIME 30 tablet 11  . gabapentin (NEURONTIN) 100 MG capsule Take 1 capsule (100 mg total) by mouth 3 (three) times daily. 90 capsule 1  . methocarbamol (ROBAXIN) 500 MG tablet Take 250 mg by mouth daily as needed for muscle spasms.     . nitroGLYCERIN (NITROSTAT) 0.4 MG SL tablet Place 1 tablet (0.4 mg total) under the tongue every 5 (five) minutes x 3 doses as needed for chest pain. 25 tablet 4  . Omega-3 Fatty Acids (FISH OIL PO) Take 1 capsule by mouth daily.    . pantoprazole (PROTONIX) 40 MG tablet Take 1 tablet (40 mg total) by mouth daily. 90 tablet 3  . rosuvastatin (CRESTOR) 5 MG tablet Take 1 tablet (5 mg total) by mouth every other day. 45 tablet 3  . sodium chloride (OCEAN) 0.65 % SOLN nasal spray Place 1 spray into both nostrils 2 (two) times daily.     No current facility-administered medications for this visit.  Allergies  Brilinta [ticagrelor]; Naprosyn [naproxen]; Aspirin; Lipitor [atorvastatin]; and Other  Electrocardiogram: 06/17/14   SR rate 82 LAD nonspecific ST changes  06/17/14   SR rate 59 normal  06/17/16  SR rate 58 normal ECG 09/27/16  SR rate 60 T wave inversion 3,F   Assessment and Plan  CAD:   Cath 09/29/16 with patent RCA stent moderate residual disease continue medical Rx Allergic Aspirin on Plavix   Chol:  Tolerating crestor qod  Esophagitis:  On protonix improved    Thyroid:  Post biopsy April 2017 biopsy  benign TSH normal 06/17/16   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 On gabapentin f/u Dr Legrand Pitts

## 2016-12-06 ENCOUNTER — Telehealth: Payer: Self-pay | Admitting: Neurology

## 2016-12-06 ENCOUNTER — Ambulatory Visit
Admission: RE | Admit: 2016-12-06 | Discharge: 2016-12-06 | Disposition: A | Discharge status: Home / Self Care | Payer: PPO | Source: Ambulatory Visit | Attending: Neurology | Admitting: Neurology

## 2016-12-06 DIAGNOSIS — R202 Paresthesia of skin: Secondary | ICD-10-CM

## 2016-12-06 MED ORDER — IOPAMIDOL (ISOVUE-370) INJECTION 76%
75.0000 mL | Freq: Once | INTRAVENOUS | Status: AC | PRN
  Administered 2016-12-06: 75 mL via INTRAVENOUS

## 2016-12-06 MED ORDER — GABAPENTIN 100 MG PO CAPS: 100.0000 mg | ORAL_CAPSULE | Freq: Three times a day (TID) | ORAL | 1 refills | Status: DC

## 2016-12-06 NOTE — Telephone Encounter (Signed)
I called patient. The CT angiogram is completely normal of the head and neck. We will try low-dose gabapentin to see if this symptoms improve, she will call for any dose adjustments.   CTA of the head and neck 12/06/16:  IMPRESSION: Negative CTA head and neck. No significant arterial stenosis or large vessel occlusion  Chronic microvascular ischemic change in the white matter.

## 2016-12-08 ENCOUNTER — Ambulatory Visit (INDEPENDENT_AMBULATORY_CARE_PROVIDER_SITE_OTHER): Payer: PPO | Admitting: Cardiovascular Disease

## 2016-12-08 VITALS — BP 132/78 | HR 71 | Ht 61.0 in | Wt 126.0 lb

## 2016-12-08 DIAGNOSIS — I480 Paroxysmal atrial fibrillation: Secondary | ICD-10-CM | POA: Diagnosis not present

## 2016-12-08 NOTE — Patient Instructions (Signed)

## 2016-12-29 ENCOUNTER — Telehealth: Payer: Self-pay | Admitting: Neurology

## 2016-12-29 NOTE — Telephone Encounter (Signed)
I called the patient. She cannot tolerate the gabapentin due to dizziness. She will stop the medication and call me if the symptoms worsen and she thinks that she needs a medication.

## 2016-12-29 NOTE — Telephone Encounter (Signed)
Pt wants Dr Jannifer Franklin to know that the gabapentin (NEURONTIN) 100 MG capsule is too strong for her and that she is very sensitive to medications, can be reached on mobile#

## 2016-12-29 NOTE — Addendum Note (Signed)
Addended by: Kathrynn Ducking on: 12/29/2016 04:51 PM   Modules accepted: Orders

## 2017-01-18 DIAGNOSIS — H6123 Impacted cerumen, bilateral: Secondary | ICD-10-CM | POA: Diagnosis not present

## 2017-01-22 ENCOUNTER — Other Ambulatory Visit: Payer: Self-pay | Admitting: Cardiovascular Disease

## 2017-02-02 DIAGNOSIS — Z79899 Other long term (current) drug therapy: Secondary | ICD-10-CM | POA: Diagnosis not present

## 2017-02-02 DIAGNOSIS — D508 Other iron deficiency anemias: Secondary | ICD-10-CM | POA: Diagnosis not present

## 2017-02-02 DIAGNOSIS — I10 Essential (primary) hypertension: Secondary | ICD-10-CM | POA: Diagnosis not present

## 2017-02-02 DIAGNOSIS — E782 Mixed hyperlipidemia: Secondary | ICD-10-CM | POA: Diagnosis not present

## 2017-05-09 ENCOUNTER — Encounter: Payer: Self-pay | Admitting: Gynecology

## 2017-05-09 ENCOUNTER — Ambulatory Visit (INDEPENDENT_AMBULATORY_CARE_PROVIDER_SITE_OTHER): Payer: PPO | Admitting: Gynecology

## 2017-05-09 VITALS — BP 118/76 | Ht 61.0 in | Wt 128.0 lb

## 2017-05-09 DIAGNOSIS — M8580 Other specified disorders of bone density and structure, unspecified site: Secondary | ICD-10-CM | POA: Diagnosis not present

## 2017-05-09 DIAGNOSIS — Z01411 Encounter for gynecological examination (general) (routine) with abnormal findings: Secondary | ICD-10-CM | POA: Diagnosis not present

## 2017-05-09 DIAGNOSIS — K469 Unspecified abdominal hernia without obstruction or gangrene: Secondary | ICD-10-CM

## 2017-05-09 DIAGNOSIS — Z1231 Encounter for screening mammogram for malignant neoplasm of breast: Secondary | ICD-10-CM | POA: Diagnosis not present

## 2017-05-09 DIAGNOSIS — N952 Postmenopausal atrophic vaginitis: Secondary | ICD-10-CM | POA: Diagnosis not present

## 2017-05-09 NOTE — Patient Instructions (Signed)
Follow up in one year, sooner as needed. 

## 2017-05-09 NOTE — Progress Notes (Signed)
    TEVA BRONKEMA [birth year] [id number]        76 y.o.  [id number] for breast and pelvic exam.  Past medical history,surgical history, problem list, medications, allergies, family history and social history were all reviewed and documented as reviewed in the EPIC chart.  ROS:  Performed with pertinent positives and negatives included in the history, assessment and plan.   Additional significant findings :  None   Exam: Caryn Bee assistant Vitals:   05/09/17 0954  BP: 118/76  Weight: 128 lb (58.1 kg)  Height: 5\' 1"  (1.549 m)   Body mass index is 24.19 kg/m.  General appearance:  Normal affect, orientation and appearance. Skin: Grossly normal HEENT: Without gross lesions.  No cervical or supraclavicular adenopathy. Thyroid normal.  Lungs:  Clear without wheezing, rales or rhonchi Cardiac: RR, without RMG Abdominal:  Soft, nontender, without masses, guarding, rebound, organomegaly or hernia Breasts:  Examined lying and sitting without masses, retractions, discharge or axillary adenopathy. Pelvic:  Ext, BUS, Vagina: With atrophic changes. Mild enterocele noted at vaginal cuff. No significant cystocele or rectocele.  Adnexa: Without masses or tenderness    Anus and perineum: Normal   Rectovaginal: Normal sphincter tone without palpated masses or tenderness.    Assessment/Plan:  76 y.o. [id number] female for breast and pelvic exam.   1. Postmenopausal/atrophic genital changes/enterocele. Status post TAH for leiomyoma. Does have mild enterocele noted this year and not noted previously. Involves the upper third of her vagina. No significant cystocele or rectocele. I discussed enterocele with the patient in detail as far as the anatomy and possible consequences. She is asymptomatic from this. We'll continue expectant management for now and follow up if she has any symptoms of pressure or protrusion. 2. Osteopenia. DEXA 2017 T score -2.2 FRAX 21%/5.8%. I reviewed her decline from her prior DEXA  and her increased FRAX fracture risk to include both overall fracture risk and hip fracture risk. I reviewed treatment options with her to include oral medications, Prolia, Reclast, Evista. Risks/benefits reviewed. Discussed that she is it much higher risk of fracture then risk of complications from the medication. Devastating sequelae from fractures also reviewed. Patient clearly understands the issues and declines treatment. I encouraged her to call me if she changes her mind. 3. Chronic right lower quadrant pain. Has been going on for over 3 years. Comes and goes. She may go weeks without pain. Tender to palpation when she does have the pain. Has undergone evaluation to include GI workup and negative CT scan. Exam is normal today. They did visualize her pelvis with her CT scan which was negative. I offered ultrasound now for ovarian surveillance although historically this sounds more GI. Patient declines ultrasound at this time. 4. Mammography 04/2017. Continue with annual mammography next year. Breast exam normal today. 5. Colonoscopy 2015. Repeat at their recommended interval. 6. Pap smear 2011. No Pap smear done today. No history of significant abnormal Pap smears. We both agree to stop screening per current screening guidelines based on age and hysterectomy history. 7. Health maintenance. No routine lab work done as patient does this elsewhere. Follow up 1 year, sooner as needed.  Additional time in excess of her breast and pelvic exam was spent in direct face to face counseling and coordination of care in regards to her enterocele discussion, osteopenia fracture risk discussion and right lower quadrant pain.    Anastasio Auerbach MD, 10:30 AM 05/09/2017

## 2017-05-24 DIAGNOSIS — L814 Other melanin hyperpigmentation: Secondary | ICD-10-CM | POA: Diagnosis not present

## 2017-05-24 DIAGNOSIS — L570 Actinic keratosis: Secondary | ICD-10-CM | POA: Diagnosis not present

## 2017-05-24 DIAGNOSIS — Z85828 Personal history of other malignant neoplasm of skin: Secondary | ICD-10-CM | POA: Diagnosis not present

## 2017-05-24 DIAGNOSIS — L218 Other seborrheic dermatitis: Secondary | ICD-10-CM | POA: Diagnosis not present

## 2017-05-24 DIAGNOSIS — D225 Melanocytic nevi of trunk: Secondary | ICD-10-CM | POA: Diagnosis not present

## 2017-05-24 DIAGNOSIS — L821 Other seborrheic keratosis: Secondary | ICD-10-CM | POA: Diagnosis not present

## 2017-05-24 DIAGNOSIS — L738 Other specified follicular disorders: Secondary | ICD-10-CM | POA: Diagnosis not present

## 2017-06-07 NOTE — Progress Notes (Signed)
Patient ID: Sarah Ray, female   DOB: 1941-02-20, 76 y.o.   MRN: [id number]   76 y.o.  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  Had single vessel disease with mid RCA occlusion No left sided disease no left to right collaterals Excellent angiographic result EF in normal range  Has had angioedema with aspirin  Readmitted cath 04/22/14    Dr Burt Knack with no restenosis Turned out to be cervical neck issue Brillinta caused dyspnea and now on plavix. Seen by Dr Fuller Plan 11/15 with mild erosive esophagitis and stricture Rx protonix   9/15  P2Y 91 showing good platlet inhibition on plavix  Had thyroid biopsy on 12/31/15:  Benign Statin changed to crestor due to myalgias on zocor but still complaining of heaviness in legs Taking crestor qod now.   Had another cath for chest pain 09/29/16 Personally reviewed cath films No hemodynamically significant symptoms  Conclusion    Widely patent mid RCA stent. 40-50% distal RCA narrowing.  40-50% eccentric proximal LAD narrowing. 50-70% second diagonal narrowing.  40% mid circumflex narrowing.  Low normal LV systolic function.  No significant coronary obstruction is noted.  RECOMMENDATIONS:   Continue current management strategy.  Current anatomy would not support ischemia as a cause of her atypical chest pain.   Since cath has seen Dr Jannifer Franklin for the pain in her shoulders and ? Cervical spine issues He started her on gabapentin She could not tolerate this and seems to do better with massage RX   ROS: Denies fever, malais, weight loss, blurry vision, decreased visual acuity, cough, sputum, SOB, hemoptysis, pleuritic pain, palpitaitons, heartburn, abdominal pain, melena, lower extremity edema, claudication, or rash.  All other systems reviewed and negative  General: BP 120/74   Pulse 63   Ht 5\' 1"  (1.549 m)   Wt 125 lb 6.4 oz (56.9 kg)   SpO2 99%   BMI 23.69 kg/m  Affect appropriate Healthy:  appears stated age 69: normal Neck  supple with no adenopathy JVP normal no bruits no thyromegaly Lungs clear with no wheezing and good diaphragmatic motion Heart:  S1/S2 no murmur, no rub, gallop or click PMI normal Abdomen: benighn, BS positve, no tenderness, no AAA no bruit.  No HSM or HJR Distal pulses intact with no bruits No edema Neuro non-focal Skin warm and dry No muscular weakness    Current Outpatient Prescriptions  Medication Sig Dispense Refill  . b complex vitamins tablet Take 1 tablet by mouth daily.    Marland Kitchen CALCIUM-VITAMIN D PO Take 1 tablet by mouth daily.    . carvedilol (COREG) 3.125 MG tablet TAKE 1 TABLET(3.125 MG) BY MOUTH TWICE DAILY 180 tablet 3  . clopidogrel (PLAVIX) 75 MG tablet TAKE 1 TABLET(75 MG) BY MOUTH AT BEDTIME 30 tablet 11  . magnesium oxide (MAG-OX) 400 MG tablet Take 400 mg by mouth daily.    . nitroGLYCERIN (NITROSTAT) 0.4 MG SL tablet Place 0.4 mg under the tongue every 5 (five) minutes as needed for chest pain.    . Omega-3 Fatty Acids (FISH OIL PO) Take 1 capsule by mouth daily.    . pantoprazole (PROTONIX) 40 MG tablet Take 1 tablet (40 mg total) by mouth daily. 90 tablet 3  . sodium chloride (OCEAN) 0.65 % SOLN nasal spray Place 1 spray into both nostrils as needed.     . rosuvastatin (CRESTOR) 5 MG tablet Take 1 tablet (5 mg total) by mouth every other day. 45 tablet 3   No  current facility-administered medications for this visit.     Allergies  Brilinta [ticagrelor]; Naprosyn [naproxen]; Aspirin; Lipitor [atorvastatin]; Other; and Gabapentin  Electrocardiogram: 06/17/14   SR rate 82 LAD nonspecific ST changes  06/17/14   SR rate 59 normal  06/17/16  SR rate 58 normal ECG 09/27/16  SR rate 60 T wave inversion 3,F   Assessment and Plan  CAD:   Cath 09/29/16 with patent RCA stent moderate residual disease continue medical Rx Allergic Aspirin on Plavix  Chol:  Tolerating crestor qod  Lab Results  Component Value Date   LDLCALC 78 06/17/2016     Esophagitis:  On protonix  improved    Thyroid:  Post biopsy April 2017 biopsy benign TSH normal 06/17/16   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 Could not tolerate gabapentin f/u Dr Legrand Pitts

## 2017-06-10 ENCOUNTER — Encounter: Payer: Self-pay | Admitting: Cardiovascular Disease

## 2017-06-10 ENCOUNTER — Ambulatory Visit (INDEPENDENT_AMBULATORY_CARE_PROVIDER_SITE_OTHER): Payer: PPO | Admitting: Cardiovascular Disease

## 2017-06-10 VITALS — BP 120/74 | HR 63 | Ht 61.0 in | Wt 125.4 lb

## 2017-06-10 DIAGNOSIS — I2510 Atherosclerotic heart disease of native coronary artery without angina pectoris: Secondary | ICD-10-CM

## 2017-06-10 NOTE — Patient Instructions (Signed)
Medication Instructions:  Your physician recommends that you continue on your current medications as directed. Please refer to the Current Medication list given to you today.   Labwork: none  Testing/Procedures: none  Follow-Up: Your physician wants you to follow-up in: 6 months with Dr. Nishan. You will receive a reminder letter in the mail two months in advance. If you don't receive a letter, please call our office to schedule the follow-up appointment.   Any Other Special Instructions Will Be Listed Below (If Applicable).     If you need a refill on your cardiac medications before your next appointment, please call your pharmacy.   

## 2017-06-12 ENCOUNTER — Other Ambulatory Visit: Payer: Self-pay | Admitting: Cardiovascular Disease

## 2017-07-28 DIAGNOSIS — M5030 Other cervical disc degeneration, unspecified cervical region: Secondary | ICD-10-CM | POA: Diagnosis not present

## 2017-07-28 DIAGNOSIS — I2510 Atherosclerotic heart disease of native coronary artery without angina pectoris: Secondary | ICD-10-CM | POA: Diagnosis not present

## 2017-07-28 DIAGNOSIS — Z79899 Other long term (current) drug therapy: Secondary | ICD-10-CM | POA: Diagnosis not present

## 2017-07-28 DIAGNOSIS — I10 Essential (primary) hypertension: Secondary | ICD-10-CM | POA: Diagnosis not present

## 2017-07-28 DIAGNOSIS — Z95820 Peripheral vascular angioplasty status with implants and grafts: Secondary | ICD-10-CM | POA: Diagnosis not present

## 2017-07-28 DIAGNOSIS — Z0000 Encounter for general adult medical examination without abnormal findings: Secondary | ICD-10-CM | POA: Diagnosis not present

## 2017-07-28 DIAGNOSIS — Z23 Encounter for immunization: Secondary | ICD-10-CM | POA: Diagnosis not present

## 2017-07-28 DIAGNOSIS — I252 Old myocardial infarction: Secondary | ICD-10-CM | POA: Diagnosis not present

## 2017-08-05 ENCOUNTER — Other Ambulatory Visit: Payer: Self-pay | Admitting: Cardiovascular Disease

## 2017-08-16 DIAGNOSIS — H52203 Unspecified astigmatism, bilateral: Secondary | ICD-10-CM | POA: Diagnosis not present

## 2017-08-16 DIAGNOSIS — H2513 Age-related nuclear cataract, bilateral: Secondary | ICD-10-CM | POA: Diagnosis not present

## 2017-08-16 DIAGNOSIS — D3132 Benign neoplasm of left choroid: Secondary | ICD-10-CM | POA: Diagnosis not present

## 2017-11-27 NOTE — Progress Notes (Signed)
Patient ID: Sarah Ray, female   DOB: 02/21/1941, 77 y.o.   MRN: [id number]   76 y.o. IMI 03/18/17 stent mid RCA normal EF. Angioedema with ASA maintained on Plavix. Brilinita caused dyspnea P2Y shows good platelet inhibition. 07/2014 esophagitis with stricture Rx protonix Benign thyroid biopsy 4/12/17Myalgias with zocor and daily crestor no taking qod. SSCP 09/29/16 patent stent   Conclusion    Widely patent mid RCA stent. 40-50% distal RCA narrowing.  40-50% eccentric proximal LAD narrowing. 50-70% second diagonal narrowing.  40% mid circumflex narrowing.  Low normal LV systolic function.  No significant coronary obstruction is noted.  RECOMMENDATIONS:   Continue current management strategy.  Current anatomy would not support ischemia as a cause of her atypical chest pain.   Since cath has seen Dr Jannifer Franklin for the pain in her shoulders and ? Cervical spine issues He started her on gabapentin She could not tolerate this and seems to do better with massage RX    ROS: Denies fever, malais, weight loss, blurry vision, decreased visual acuity, cough, sputum, SOB, hemoptysis, pleuritic pain, palpitaitons, heartburn, abdominal pain, melena, lower extremity edema, claudication, or rash.  All other systems reviewed and negative  General: BP (!) 148/98   Pulse 60   Ht 5\' 1"  (1.549 m)   Wt 130 lb 8 oz (59.2 kg)   BMI 24.66 kg/m   Repeat BP by me 130 80 mmHg Home readings fine  Affect appropriate Healthy:  appears stated age HEENT: normal Neck supple with no adenopathy JVP normal no bruits no thyromegaly Lungs clear with no wheezing and good diaphragmatic motion Heart:  S1/S2 no murmur, no rub, gallop or click PMI normal Abdomen: benighn, BS positve, no tenderness, no AAA no bruit.  No HSM or HJR Distal pulses intact with no bruits No edema Neuro non-focal Skin warm and dry No muscular weakness     Current Outpatient Medications  Medication Sig Dispense Refill  . b  complex vitamins tablet Take 1 tablet by mouth daily.    Marland Kitchen CALCIUM-VITAMIN D PO Take 1 tablet by mouth daily.    . carvedilol (COREG) 3.125 MG tablet TAKE 1 TABLET(3.125 MG) BY MOUTH TWICE DAILY 180 tablet 3  . clopidogrel (PLAVIX) 75 MG tablet TAKE 1 TABLET(75 MG) BY MOUTH AT BEDTIME 90 tablet 2  . nitroGLYCERIN (NITROSTAT) 0.4 MG SL tablet Place 0.4 mg under the tongue every 5 (five) minutes as needed for chest pain.    . Omega-3 Fatty Acids (FISH OIL PO) Take 1 capsule by mouth daily.    . pantoprazole (PROTONIX) 40 MG tablet Take 1 tablet (40 mg total) by mouth daily. 90 tablet 3  . rosuvastatin (CRESTOR) 5 MG tablet Take 1 tablet (5 mg total) by mouth every other day. 45 tablet 3  . sodium chloride (OCEAN) 0.65 % SOLN nasal spray Place 1 spray into both nostrils as needed.     . magnesium oxide (MAG-OX) 400 MG tablet Take 400 mg by mouth daily.     No current facility-administered medications for this visit.     Allergies  Brilinta [ticagrelor]; Naprosyn [naproxen]; Aspirin; Lipitor [atorvastatin]; Other; and Gabapentin  Electrocardiogram: 11/19/17  NSR rate 60 normal   Assessment and Plan  CAD:   Cath 09/29/16 with patent RCA stent Allergic to aspirin maintained on plavix   Chol:  Tolerating crestor qod labs with primary in [place]   Esophagitis:  On protonix improved  Sees [facility]   Thyroid:  Post biopsy April 2017 biopsy  benign TSH normal 06/17/16   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 Could not tolerate gabapentin f/u Dr Legrand Pitts

## 2017-11-29 ENCOUNTER — Encounter: Payer: Self-pay | Admitting: Cardiovascular Disease

## 2017-11-29 ENCOUNTER — Ambulatory Visit: Payer: PPO | Admitting: Cardiovascular Disease

## 2017-11-29 VITALS — BP 148/98 | HR 60 | Ht 61.0 in | Wt 130.5 lb

## 2017-11-29 DIAGNOSIS — E785 Hyperlipidemia, unspecified: Secondary | ICD-10-CM

## 2017-11-29 DIAGNOSIS — I2510 Atherosclerotic heart disease of native coronary artery without angina pectoris: Secondary | ICD-10-CM

## 2017-11-29 NOTE — Patient Instructions (Signed)

## 2017-12-06 ENCOUNTER — Other Ambulatory Visit: Payer: Self-pay | Admitting: Gastroenterology

## 2017-12-09 ENCOUNTER — Other Ambulatory Visit: Payer: Self-pay | Admitting: Gastroenterology

## 2018-01-23 DIAGNOSIS — E782 Mixed hyperlipidemia: Secondary | ICD-10-CM | POA: Diagnosis not present

## 2018-01-23 DIAGNOSIS — E559 Vitamin D deficiency, unspecified: Secondary | ICD-10-CM | POA: Diagnosis not present

## 2018-01-23 DIAGNOSIS — R5381 Other malaise: Secondary | ICD-10-CM | POA: Diagnosis not present

## 2018-01-23 DIAGNOSIS — R5383 Other fatigue: Secondary | ICD-10-CM | POA: Diagnosis not present

## 2018-01-23 DIAGNOSIS — E538 Deficiency of other specified B group vitamins: Secondary | ICD-10-CM | POA: Diagnosis not present

## 2018-01-27 DIAGNOSIS — K219 Gastro-esophageal reflux disease without esophagitis: Secondary | ICD-10-CM | POA: Diagnosis not present

## 2018-01-27 DIAGNOSIS — I700 Atherosclerosis of aorta: Secondary | ICD-10-CM | POA: Diagnosis not present

## 2018-01-27 DIAGNOSIS — I10 Essential (primary) hypertension: Secondary | ICD-10-CM | POA: Diagnosis not present

## 2018-01-27 DIAGNOSIS — I2510 Atherosclerotic heart disease of native coronary artery without angina pectoris: Secondary | ICD-10-CM | POA: Diagnosis not present

## 2018-03-02 DIAGNOSIS — H6123 Impacted cerumen, bilateral: Secondary | ICD-10-CM | POA: Diagnosis not present

## 2018-03-09 ENCOUNTER — Other Ambulatory Visit: Payer: Self-pay | Admitting: Gastroenterology

## 2018-03-13 ENCOUNTER — Telehealth: Payer: Self-pay | Admitting: Gastroenterology

## 2018-03-13 NOTE — Telephone Encounter (Signed)
Left a message for patient to return my call. 

## 2018-03-14 MED ORDER — PANTOPRAZOLE SODIUM 40 MG PO TBEC: 40.0000 mg | DELAYED_RELEASE_TABLET | Freq: Every day | ORAL | 0 refills | Status: DC

## 2018-03-14 NOTE — Telephone Encounter (Signed)
Pt returned your call. She stated that she is out of med. She uses [facility] in [place] on [address].

## 2018-03-14 NOTE — Telephone Encounter (Signed)
Prescription sent to patient's pharmacy and patient schedule follow up appt for 05/25/18.

## 2018-03-28 DIAGNOSIS — H0012 Chalazion right lower eyelid: Secondary | ICD-10-CM | POA: Diagnosis not present

## 2018-04-18 DIAGNOSIS — H0012 Chalazion right lower eyelid: Secondary | ICD-10-CM | POA: Diagnosis not present

## 2018-04-27 ENCOUNTER — Telehealth: Payer: Self-pay | Admitting: Cardiovascular Disease

## 2018-04-27 NOTE — Telephone Encounter (Signed)
New Message       STAT if HR is under 50 or over 120 (normal HR is 60-100 beats per minute)  1) What is your heart rate? 103   bp 160/90  2) Do you have a log of your heart rate readings (document readings)? 3) 157/106   Hr 128   @8 :20        148/100 hr 96    @8 :35          151/102 hr 132  @8 :45                115/65 hr   65    @ 7:00  4) Do you have any other symptoms? Pressure across her back and neck                Patient was doing cardiac rehab.

## 2018-04-27 NOTE — Telephone Encounter (Signed)
Returned call to Pt.  Per Pt she had a normal blood pressure and pulse before going to cardiac rehab.  After her workout her blood pressure and pulse were elevated.  The cardiac rehab nurse asked Pt to stay and let them monitor her cool down but she refused.  Pt went home and states she has checked her BP every 15 minutes since she left cardiac rehab. BP continues to be elevated.  Discussed BP with Pt, advised what could effect BP's including exercise and anxiety.   Discussed with Dr. Johnsie Cancel.   Per Dr. Delane Ginger have Pt stop taking blood pressure.  Can take one extra carvedilol.  Advised Pt to stop taking blood pressures.  Advised she could take one extra carvedilol now, and then advised not to take blood pressure again until later this afternoon.  Explained that one elevated blood pressure is not going to change Pt treatment.  Advised if she had a trend of elevated blood pressures we would reassess.  Pt has some anxiety.  Tried to reassure Pt.  No further needs at this time.

## 2018-05-02 ENCOUNTER — Other Ambulatory Visit: Payer: Self-pay | Admitting: Cardiovascular Disease

## 2018-05-02 ENCOUNTER — Other Ambulatory Visit: Payer: Self-pay | Admitting: Gastroenterology

## 2018-05-03 DIAGNOSIS — I200 Unstable angina: Secondary | ICD-10-CM | POA: Diagnosis not present

## 2018-05-03 DIAGNOSIS — R55 Syncope and collapse: Secondary | ICD-10-CM | POA: Diagnosis not present

## 2018-05-03 DIAGNOSIS — R0789 Other chest pain: Secondary | ICD-10-CM | POA: Diagnosis not present

## 2018-05-03 DIAGNOSIS — R0902 Hypoxemia: Secondary | ICD-10-CM | POA: Diagnosis not present

## 2018-05-03 DIAGNOSIS — R079 Chest pain, unspecified: Secondary | ICD-10-CM | POA: Diagnosis not present

## 2018-05-04 ENCOUNTER — Observation Stay (HOSPITAL_BASED_OUTPATIENT_CLINIC_OR_DEPARTMENT_OTHER): Payer: PPO

## 2018-05-04 ENCOUNTER — Observation Stay (HOSPITAL_COMMUNITY)
Admission: EM | Admit: 2018-05-04 | Discharge: 2018-05-04 | Disposition: A | Discharge status: Home / Self Care | Payer: PPO | Attending: Cardiovascular Disease | Admitting: Cardiovascular Disease

## 2018-05-04 ENCOUNTER — Encounter (HOSPITAL_COMMUNITY): Payer: Self-pay

## 2018-05-04 ENCOUNTER — Other Ambulatory Visit: Payer: Self-pay

## 2018-05-04 ENCOUNTER — Emergency Department (HOSPITAL_COMMUNITY): Payer: PPO

## 2018-05-04 DIAGNOSIS — E7800 Pure hypercholesterolemia, unspecified: Secondary | ICD-10-CM | POA: Insufficient documentation

## 2018-05-04 DIAGNOSIS — R55 Syncope and collapse: Secondary | ICD-10-CM | POA: Diagnosis not present

## 2018-05-04 DIAGNOSIS — R079 Chest pain, unspecified: Secondary | ICD-10-CM | POA: Diagnosis present

## 2018-05-04 DIAGNOSIS — Z9861 Coronary angioplasty status: Secondary | ICD-10-CM

## 2018-05-04 DIAGNOSIS — I361 Nonrheumatic tricuspid (valve) insufficiency: Secondary | ICD-10-CM

## 2018-05-04 DIAGNOSIS — I2510 Atherosclerotic heart disease of native coronary artery without angina pectoris: Secondary | ICD-10-CM | POA: Insufficient documentation

## 2018-05-04 DIAGNOSIS — R0789 Other chest pain: Secondary | ICD-10-CM | POA: Diagnosis not present

## 2018-05-04 DIAGNOSIS — Z955 Presence of coronary angioplasty implant and graft: Secondary | ICD-10-CM | POA: Diagnosis not present

## 2018-05-04 DIAGNOSIS — I10 Essential (primary) hypertension: Secondary | ICD-10-CM | POA: Insufficient documentation

## 2018-05-04 DIAGNOSIS — I200 Unstable angina: Secondary | ICD-10-CM | POA: Diagnosis not present

## 2018-05-04 LAB — TROPONIN I
Troponin I: <0.03 ng/mL (ref <0.03)
Troponin I: <0.03 ng/mL (ref <0.03)

## 2018-05-04 LAB — I-STAT TROPONIN, ED: TROPONIN I, POC: 0 ng/mL (ref 0.00–0.08)

## 2018-05-04 LAB — CBC WITH DIFFERENTIAL/PLATELET
Abs Immature Granulocytes: 0 10*3/uL (ref 0.0–0.1)
BASOS PCT: 1 %
Basophils Absolute: 0 10*3/uL (ref 0.0–0.1)
EOS ABS: 0.1 10*3/uL (ref 0.0–0.7)
Eosinophils Relative: 2 %
HCT: 40.9 % (ref 36.0–46.0)
Hemoglobin: 13.5 g/dL (ref 12.0–15.0)
Immature Granulocytes: 0 %
Lymphocytes Relative: 23 %
Lymphs Abs: 1.1 10*3/uL (ref 0.7–4.0)
MCH: 32.9 pg (ref 26.0–34.0)
MCHC: 33 g/dL (ref 30.0–36.0)
MCV: 99.8 fL (ref 78.0–100.0)
MONO ABS: 0.6 10*3/uL (ref 0.1–1.0)
MONOS PCT: 12 %
Neutro Abs: 3.1 10*3/uL (ref 1.7–7.7)
Neutrophils Relative %: 62 %
PLATELETS: 198 10*3/uL (ref 150–400)
RBC: 4.1 MIL/uL (ref 3.87–5.11)
RDW: 12.1 % (ref 11.5–15.5)
WBC: 4.9 10*3/uL (ref 4.0–10.5)

## 2018-05-04 LAB — I-STAT CHEM 8, ED
BUN: 13 mg/dL (ref 8–23)
CALCIUM ION: 1.2 mmol/L (ref 1.15–1.40)
Chloride: 104 mmol/L (ref 98–111)
Creatinine, Ser: 0.9 mg/dL (ref 0.44–1.00)
GLUCOSE: 103 mg/dL — AB (ref 70–99)
HCT: 38 % (ref 36.0–46.0)
HEMOGLOBIN: 12.9 g/dL (ref 12.0–15.0)
Potassium: 3.7 mmol/L (ref 3.5–5.1)
SODIUM: 140 mmol/L (ref 135–145)
TCO2: 24 mmol/L (ref 22–32)

## 2018-05-04 LAB — ECHOCARDIOGRAM COMPLETE
HEIGHTINCHES: 61 in
Weight: 2033.6 oz

## 2018-05-04 LAB — CREATININE, SERUM
Creatinine, Ser: 0.88 mg/dL (ref 0.44–1.00)
GFR calc Af Amer: >60 mL/min (ref >60)
GFR calc non Af Amer: >60 mL/min (ref >60)

## 2018-05-04 LAB — D-DIMER, QUANTITATIVE: D-Dimer, Quant: <0.27 ug/mL-FEU (ref 0.00–0.50)

## 2018-05-04 LAB — BRAIN NATRIURETIC PEPTIDE: B Natriuretic Peptide: 113.6 pg/mL — ABNORMAL HIGH (ref 0.0–100.0)

## 2018-05-04 MED ORDER — NITROGLYCERIN 0.4 MG SL SUBL: 0.4000 mg | SUBLINGUAL_TABLET | SUBLINGUAL | Status: DC | PRN

## 2018-05-04 MED ORDER — GI COCKTAIL ~~LOC~~
30.0000 mL | Freq: Once | ORAL | Status: AC
  Administered 2018-05-04: 30 mL via ORAL
  Filled 2018-05-04: qty 30

## 2018-05-04 MED ORDER — PANTOPRAZOLE SODIUM 40 MG PO TBEC
40.0000 mg | DELAYED_RELEASE_TABLET | Freq: Every day | ORAL | Status: DC
  Administered 2018-05-04: 40 mg via ORAL
  Filled 2018-05-04: qty 1

## 2018-05-04 MED ORDER — ENOXAPARIN SODIUM 40 MG/0.4ML ~~LOC~~ SOLN: 40.0000 mg | Freq: Every day | SUBCUTANEOUS | Status: DC

## 2018-05-04 MED ORDER — FENTANYL CITRATE (PF) 100 MCG/2ML IJ SOLN
50.0000 ug | Freq: Once | INTRAMUSCULAR | Status: DC
  Filled 2018-05-04: qty 2

## 2018-05-04 MED ORDER — CLOPIDOGREL BISULFATE 75 MG PO TABS
75.0000 mg | ORAL_TABLET | Freq: Every day | ORAL | Status: DC
  Administered 2018-05-04: 75 mg via ORAL
  Filled 2018-05-04: qty 1

## 2018-05-04 MED ORDER — ROSUVASTATIN CALCIUM 5 MG PO TABS: 5.0000 mg | ORAL_TABLET | ORAL | Status: DC

## 2018-05-04 MED ORDER — ONDANSETRON HCL 4 MG/2ML IJ SOLN: 4.0000 mg | Freq: Four times a day (QID) | INTRAMUSCULAR | Status: DC | PRN

## 2018-05-04 MED ORDER — CARVEDILOL 3.125 MG PO TABS
3.1250 mg | ORAL_TABLET | Freq: Two times a day (BID) | ORAL | Status: DC
  Administered 2018-05-04: 3.125 mg via ORAL
  Filled 2018-05-04: qty 1

## 2018-05-04 NOTE — ED Triage Notes (Signed)
Pt. Arrived by EMS from home with reports of central chest pressure that radiates to right breast and a up to shoulder blades. Pt. Describes the shoulders as a burning sensation. Pt. Took 1 nitro without relief. 12 lead unremarkable. Pt. Had an MI in 2015 and has 1 stent placed. Pt. Recently changed cholesterol medication. Pt. Also had an unwitnessed syncopal episode this past Tuesday but did not have pain at the time. A/O X4

## 2018-05-04 NOTE — Discharge Summary (Signed)
Discharge Summary    Patient ID: Sarah Ray,  MRN: [id number], DOB/AGE: 1941/04/06 77 y.o.  Admit date: 05/04/2018 Discharge date: 05/04/2018  Primary Care Provider: Gilford Rile A. Primary Cardiologist: Jenkins Rouge, MD  Discharge Diagnoses    Principal Problem:   Chest pain Active Problems:   HYPERCHOLESTEROLEMIA   CAD (coronary artery disease)   HTN (hypertension)   Chest pain, unspecified   Allergies Allergies  Allergen Reactions  . Brilinta [Ticagrelor] Shortness Of Breath and Other (See Comments)    Caused chest pains  . Naprosyn [Naproxen] Hives  . Aspirin Hives, Diarrhea and Swelling    Benadryl stops reaction  . Lipitor [Atorvastatin] Other (See Comments)    Myalgias and leg heaviness   . Other Other (See Comments)    Very sensitive to all meds  . Gabapentin     dizziness    Diagnostic Studies/Procedures    Echocardiogram 05/04/18: Study Conclusions  - Left ventricle: The cavity size was normal. Systolic function was normal. The estimated ejection fraction was in the range of 55% to 60%. Wall motion was normal; there were no regional wall motion abnormalities. Left ventricular diastolic function parameters were normal. - Aortic valve: There was trivial regurgitation. - Mitral valve: Valve area by pressure half-time: 1.72 cm^2. - Left atrium: The atrium was mildly dilated.  _____________   History of Present Illness     77 y.o. 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 The patient had angioedema with aspirin and is therefore only on Plavix. She has side effects to multiple medications. Brillinta caused dyspnea. She has myalgias with Zocor and atorvastatin. She has been able to tolerate low dose of Crestor. She previously has had episodes of atypical chest pain.  The patient presented with complaints of pain across  both shoulder blades on the back with radiation to the right breast. She states that she participated in cardiac rehabilitation last Friday and tolerated the session well. Since Friday evening she has been experiencing the discomfort in her back and chest. Symptoms have stayed with her more or less constantly for the past 3 days. She describes it as a burning sensation. On Tuesday when using the bathroom she felt dizzy and thinks that she fainted. She was able to brace herself. She did not suffer any trauma. Patient has taken nitroglycerin without relief. She endorses back pain in the ED.  Her cardiac catheterization in January 2018 revealed the following: - Widely patent mid RCA stent. 40-50% distal RCA narrowing - 40-50% eccentric proximal LAD narrowing. 50-70% second diagonal narrowing. - 40% mid circumflex narrowing. - Low normal LV systolic function. - No significant coronary obstruction is noted.   Hospital Course     Consultants: None   1. Atypical chest pain in patient with CAD s/p inferior MI 02/2017: patient presented with constant back pain which radiates to right breast for the past 3 days. CXR was without acute findings. Troponin negative x3. BNP 113.6. Ddimer negative. Vitals remained stable and no arrhythmias noted on telemetry. Echo revealed EF 55-60% and no wall motion abnormalities. Her symptoms improved with a GI cocktail and did not recur. She was recommended to add an H2 blocker to her antacid regimen and follow-up outpatient with GI as previously scheduled. - Continue plavix, statin, and metoprolol - Continue pantoprazole and zantac or pepcid for likely GI origin of chest pain.  2. ?Syncope: patient reported an episode of possible syncope while going to the bathroom. Event suspicious for vaso-vagal response. Troponins have been negative, vitals stable, and no arrhythmias noted on telemetry.  - No further work-up recommended.   3. HLD: intolerant to high intensity  statins - Continue low dose crestor   4. HTN: BP stable this admission - Continue coreg _____________  Discharge Vitals Blood pressure 130/73, pulse 65, temperature (!) 97.4 F (36.3 C), temperature source Oral, resp. rate 18, height 5\' 1"  (1.549 m), weight 57.7 kg, SpO2 100 %.  Filed Weights   05/04/18 0041 05/04/18 0434  Weight: 57.6 kg 57.7 kg    Labs & Radiologic Studies    CBC Recent Labs    05/04/18 0022 05/04/18 0039  WBC 4.9  --   NEUTROABS 3.1  --   HGB 13.5 12.9  HCT 40.9 38.0  MCV 99.8  --   PLT 198  --    Basic Metabolic Panel Recent Labs    05/04/18 0039 05/04/18 0442  NA 140  --   K 3.7  --   CL 104  --   GLUCOSE 103*  --   BUN 13  --   CREATININE 0.90 0.88   Liver Function Tests No results for input(s): AST, ALT, ALKPHOS, BILITOT, PROT, ALBUMIN in the last 72 hours. No results for input(s): LIPASE, AMYLASE in the last 72 hours. Cardiac Enzymes Recent Labs    05/04/18 0442 05/04/18 1021  TROPONINI <0.03 <0.03   BNP Invalid input(s): POCBNP D-Dimer Recent Labs    05/04/18 0022  DDIMER <0.27   Hemoglobin A1C No results for input(s): HGBA1C in the last 72 hours. Fasting Lipid Panel No results for input(s): CHOL, HDL, LDLCALC, TRIG, CHOLHDL, LDLDIRECT in the last 72 hours. Thyroid Function Tests No results for input(s): TSH, T4TOTAL, T3FREE, THYROIDAB in the last 72 hours.  Invalid input(s): FREET3 _____________  Dg Chest Portable 1 View  Result Date: 05/04/2018 CLINICAL DATA:  Chest pain EXAM: PORTABLE CHEST 1 VIEW COMPARISON:  10/24/2016 FINDINGS: The heart size and mediastinal contours are within normal limits. Both lungs are clear. The visualized skeletal structures are unremarkable. IMPRESSION: No active disease. Electronically Signed   By: Inez Catalina M.D.   On: 05/04/2018 01:01   Disposition   Patient was seen and examined by Dr. Sallyanne Kuster who deemed patient as stable for discharge. Follow-up has been arranged. Discharge  medications as listed below.   Follow-up Plans & Appointments    Follow-up Information    Ladene Artist, MD Follow up on 06/22/2018.   Specialty:  Gastroenterology Why:  Please arrive 15 minutes early for your 10:30am appointment Contact information: [address] [place] [address] [phone]        [name], MD Follow up on 07/04/2018.   Specialty:  Cardiology Why:  Please arrive 15 minutes early for your 10:00am appointment Contact information: [address] [place] [address] [phone]          Discharge Instructions    Diet - low sodium heart healthy   Complete by:  As directed    Increase activity slowly   Complete by:  As directed       Discharge Medications   Allergies as of 05/04/2018      Reactions   Brilinta [ticagrelor] Shortness Of Breath, Other (See Comments)   Caused chest pains   Naprosyn [naproxen] Hives   Aspirin Hives, Diarrhea, Swelling   Benadryl stops reaction  Lipitor [atorvastatin] Other (See Comments)   Myalgias and leg heaviness   Other Other (See Comments)   Very sensitive to all meds   Gabapentin    dizziness      Medication List    TAKE these medications   b complex vitamins tablet Take 1 tablet by mouth daily.   CALCIUM-VITAMIN D PO Take 1 tablet by mouth daily.   carvedilol 3.125 MG tablet Commonly known as:  COREG TAKE 1 TABLET(3.125 MG) BY MOUTH TWICE DAILY What changed:  See the new instructions.   clopidogrel 75 MG tablet Commonly known as:  PLAVIX TAKE 1 TABLET(75 MG) BY MOUTH AT BEDTIME What changed:  See the new instructions.   FISH OIL PO Take 1 capsule by mouth daily.   nitroGLYCERIN 0.4 MG SL tablet Commonly known as:  NITROSTAT Place 0.4 mg under the tongue every 5 (five) minutes as needed for chest pain.   pantoprazole 40 MG tablet Commonly known as:  PROTONIX Take 1 tablet (40 mg total) by mouth daily.   rosuvastatin 5 MG tablet Commonly known as:   CRESTOR Take 1 tablet (5 mg total) by mouth every other day. What changed:    when to take this  additional instructions       Outstanding Labs/Studies   None  Duration of Discharge Encounter   Greater than 30 minutes including physician time.  Signed, Abigail Butts PA-C 05/04/2018, 11:56 AM

## 2018-05-04 NOTE — H&P (Signed)
Cardiology Admission History and Physical:   Patient ID: Sarah Ray; MRN: [id number]; DOB: 03-Sep-1941   Admission date: 05/04/2018  Primary Care Provider: Raina Mina., MD Primary Cardiologist:  Jenkins Rouge, MD   Chief Complaint:  Chest pain  History of Present Illness:   Sarah Ray is a 77 y.o. 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 The patient had angioedema with aspirin and is therefore only on Plavix. She has side effects to multiple medications. Brillinta caused dyspnea. She has myalgias with Zocor and atorvastatin. She has been able to tolerate low dose of Crestor. She previously has had episodes of atypical chest pain.  The patient presents with complaints of pain across both shoulder blades on the back with radiation to the right breast. She states that she participated in cardiac rehabilitation last Friday and tolerated the session well. Since Friday evening she has been experiencing the discomfort in her back and chest. Symptoms have stayed with her more or less constantly for the past 3 days. She describes it as a burning sensation. On Tuesday when using the bathroom she felt dizzy and thinks that she fainted. She was able to brace herself. She did not suffer any trauma. Patient has taken nitroglycerin without relief. She endorses back pain in the ED.  Her cardiac catheterization in January 2018 revealed the following: - Widely patent mid RCA stent. 40-50% distal RCA narrowing - 40-50% eccentric proximal LAD narrowing. 50-70% second diagonal narrowing. - 40% mid circumflex narrowing. - Low normal LV systolic function. - No significant coronary obstruction is noted.     Past Medical History:  Diagnosis Date  . Adenomatous polyps 07/1997  . Breast fibroadenoma   . CAD (coronary artery disease) 03/18/14   STEMI- stent to RCA and  nonobstructive disease LAD and LCX  . External hemorrhoids   . GERD (gastroesophageal reflux disease)    LA Class Grade C Erosive esophagitis  . Hiatal hernia   . HTN (hypertension)   . Hyperlipidemia   . Internal hemorrhoids   . Osteopenia 2017   T score -2.2 FRAX 21%/5.8%  . Peptic stricture of esophagus   . Skin cancer 2014   "forehead"  . ST elevation myocardial infarction (STEMI) involving right coronary artery in recovery phase ([place]) 03/18/14    Past Surgical History:  Procedure Laterality Date  . ABDOMINAL HYSTERECTOMY  1977   Leiomyoma  . BREAST CYST ASPIRATION Right ~ 2011  . BUNIONECTOMY Bilateral 04/04/2013  . CARDIAC CATHETERIZATION N/A 09/29/2016   Procedure: Left Heart Cath and Coronary Angiography;  Surgeon: Belva Crome, MD;  Location: [facility];  Service: Cardiovascular;  Laterality: N/A;  . CORONARY ANGIOPLASTY WITH STENT PLACEMENT  03/18/14   Xience DES to totally occl RCA, residual non obstructive disease to LAD and LCX  . ESOPHAGOGASTRODUODENOSCOPY (EGD) WITH ESOPHAGEAL DILATION  2000's X 1  . LEFT HEART CATH N/A 03/18/2014   Procedure: LEFT HEART CATH;  Surgeon: Blane Ohara, MD;  Location: [facility] CATH LAB;  Service: Cardiovascular;  Laterality: N/A;  . LEFT HEART CATHETERIZATION WITH CORONARY ANGIOGRAM N/A 04/22/2014   Procedure: LEFT HEART CATHETERIZATION WITH CORONARY ANGIOGRAM;  Surgeon: Blane Ohara, MD;  Location: [facility] CATH LAB;  Service: Cardiovascular;  Laterality: N/A;  . SKIN CANCER EXCISION  2014  . THYROID CYST EXCISION  1970's  . TONSILLECTOMY AND ADENOIDECTOMY  ~ 1950  Medications Prior to Admission: Prior to Admission medications   Medication Sig Start Date End Date Taking? Authorizing Provider  b complex vitamins tablet Take 1 tablet by mouth daily.   Yes [provider]  CALCIUM-VITAMIN D PO Take 1 tablet by mouth daily.   Yes [provider]  carvedilol (COREG) 3.125 MG tablet TAKE 1 TABLET(3.125 MG) BY MOUTH  TWICE DAILY Patient taking differently: Take 3.125 mg by mouth 2 (two) times daily with a meal.  06/14/17  Yes [name], MD  clopidogrel (PLAVIX) 75 MG tablet TAKE 1 TABLET(75 MG) BY MOUTH AT BEDTIME Patient taking differently: Take 75 mg by mouth daily.  05/02/18  Yes [name], MD  nitroGLYCERIN (NITROSTAT) 0.4 MG SL tablet Place 0.4 mg under the tongue every 5 (five) minutes as needed for chest pain.   Yes [provider]  Omega-3 Fatty Acids (FISH OIL PO) Take 1 capsule by mouth daily.   Yes [provider]  pantoprazole (PROTONIX) 40 MG tablet Take 1 tablet (40 mg total) by mouth daily. 05/02/18  Yes Ladene Artist, MD  rosuvastatin (CRESTOR) 5 MG tablet Take 1 tablet (5 mg total) by mouth every other day. Patient taking differently: Take 5 mg by mouth See admin instructions. Four times weekly 10/27/16 05/04/18 Yes [name], MD     Allergies:    Allergies  Allergen Reactions  . Brilinta [Ticagrelor] Shortness Of Breath and Other (See Comments)    Caused chest pains  . Naprosyn [Naproxen] Hives  . Aspirin Hives, Diarrhea and Swelling    Benadryl stops reaction  . Lipitor [Atorvastatin] Other (See Comments)    Myalgias and leg heaviness   . Other Other (See Comments)    Very sensitive to all meds  . Gabapentin     dizziness    Social History:   Social History   Socioeconomic History  . Marital status: Married    Spouse name: [name]  . Number of children: 2  . Years of education: Associates  . Highest education level: Not on file  Occupational History  . Occupation: Dance movement psychotherapist  Social Needs  . Financial resource strain: Not on file  . Food insecurity:    Worry: Not on file    Inability: Not on file  . Transportation needs:    Medical: Not on file    Non-medical: Not on file  Tobacco Use  . Smoking status: Never Smoker  . Smokeless tobacco: Never Used  Substance and Sexual Activity  . Alcohol use: No    Alcohol/week: 0.0  standard drinks  . Drug use: No  . Sexual activity: Not Currently    Birth control/protection: Surgical, Post-menopausal    Comment: HYST-1st intercourse 77 yo-Fewer than 5 partners  Lifestyle  . Physical activity:    Days per week: Not on file    Minutes per session: Not on file  . Stress: Not on file  Relationships  . Social connections:    Talks on phone: Not on file    Gets together: Not on file    Attends religious service: Not on file    Active member of club or organization: Not on file    Attends meetings of clubs or organizations: Not on file    Relationship status: Not on file  . Intimate partner violence:    Fear of current or ex partner: Not on file    Emotionally abused: Not on file    Physically abused: Not on file  Forced sexual activity: Not on file  Other Topics Concern  . Not on file  Social History Narrative   Lives with husband   Daily caffeine:  1 cup per day (coffee or tea)     Family History:   The patient's family history includes Breast cancer in her daughter and maternal aunt; Colon cancer in her maternal aunt; Colon polyps in her mother; Heart disease in her brother.     Review of Systems: [y] = yes, [ ]  = no   . General: Weight gain [ ] ; Weight loss [ ] ; Anorexia [ ] ; Fatigue [ ] ; Fever [ ] ; Chills [ ] ; Weakness [ ]   . Cardiac: Chest pain/pressure Blue.Reese ]; Resting SOB [ ] ; Exertional SOB [ ] ; Orthopnea [ ] ; Pedal Edema [ ] ; Palpitations [ ] ; Syncope Blue.Reese ]; Presyncope [ ] ; Paroxysmal nocturnal dyspnea[ ]   . Pulmonary: Cough [ ] ; Wheezing[ ] ; Hemoptysis[ ] ; Sputum [ ] ; Snoring [ ]   . GI: Vomiting[ ] ; Dysphagia[ ] ; Melena[ ] ; Hematochezia [ ] ; Heartburn[ ] ; Abdominal pain [ ] ; Constipation [ ] ; Diarrhea Blue.Reese ]; BRBPR [ ]   . GU: Hematuria[ ] ; Dysuria [ ] ; Nocturia[ ]   . Vascular: Pain in legs with walking [ ] ; Pain in feet with lying flat [ ] ; Non-healing sores [ ] ; Stroke [ ] ; TIA [ ] ; Slurred speech [ ] ;  . Neuro: Headaches[ ] ; Vertigo[ ] ; Seizures[ ] ;  Paresthesias[ ] ;Blurred vision [ ] ; Diplopia [ ] ; Vision changes [ ]   . Ortho/Skin: Arthritis [ ] ; Joint pain [ ] ; Muscle pain [ ] ; Joint swelling [ ] ; Back Pain [ ] ; Rash [ ]   . Psych: Depression[ ] ; Anxiety[ ]   . Heme: Bleeding problems [ ] ; Clotting disorders [ ] ; Anemia [ ]   . Endocrine: Diabetes [ ] ; Thyroid dysfunction[ ]      Physical Exam/Data:   Vitals:   05/04/18 0041 05/04/18 0100 05/04/18 0130 05/04/18 0200  BP:  120/79 131/78 134/80  Pulse:  70 65 69  Resp:  18 14 17   Temp:      TempSrc:      SpO2:  97% 98% 93%  Weight: 57.6 kg     Height: 5\' 1"  (1.549 m)      No intake or output data in the 24 hours ending 05/04/18 0253 Filed Weights   05/04/18 0041  Weight: 57.6 kg   Body mass index is 24 kg/m.  General:  Well nourished, well developed, in no acute distress HEENT: normal Lymph: no adenopathy Neck: no JVD Endocrine:  No thryomegaly Vascular: No carotid bruits; FA pulses 2+ bilaterally without bruits  Cardiac:  normal S1, S2; RRR; no murmur  Lungs:  clear to auscultation bilaterally, no wheezing, rhonchi or rales  Abd: soft, nontender, no hepatomegaly  Ext: no edema Musculoskeletal:  No deformities, BUE and BLE strength normal and equal Skin: warm and dry  Neuro:  CNs 2-12 intact, no focal abnormalities noted Psych:  Normal affect    EKG:  The ECG that was done 05/04/18 was personally reviewed and demonstrates sinus rhythm, rate 73 bpm with no acute ischemic changes.   Laboratory Data:  Chemistry Recent Labs  Lab 05/04/18 0039  NA 140  K 3.7  CL 104  GLUCOSE 103*  BUN 13  CREATININE 0.90    No results for input(s): PROT, ALBUMIN, AST, ALT, ALKPHOS, BILITOT in the last 168 hours. Hematology Recent Labs  Lab 05/04/18 0022 05/04/18 0039  WBC 4.9  --   RBC 4.10  --  HGB 13.5 12.9  HCT 40.9 38.0  MCV 99.8  --   MCH 32.9  --   MCHC 33.0  --   RDW 12.1  --   PLT 198  --    Cardiac EnzymesNo results for input(s): TROPONINI in the last 168  hours.  Recent Labs  Lab 05/04/18 0037  TROPIPOC 0.00    BNP Recent Labs  Lab 05/04/18 0030  BNP 113.6*    DDimer  Recent Labs  Lab 05/04/18 0022  DDIMER <0.27    Radiology/Studies:  Dg Chest Portable 1 View  Result Date: 05/04/2018 CLINICAL DATA:  Chest pain EXAM: PORTABLE CHEST 1 VIEW COMPARISON:  10/24/2016 FINDINGS: The heart size and mediastinal contours are within normal limits. Both lungs are clear. The visualized skeletal structures are unremarkable. IMPRESSION: No active disease. Electronically Signed   By: Inez Catalina M.D.   On: 05/04/2018 01:01    Assessment and Plan:   1. Atypical chest pain  - Continue her home medications - Trend cardiac biomarkers and obtain serial ECGs - Obtain a transthoracic echocardiogram to evaluate the LV systolic and diastolic function - If cardiac workup is negative then investigate alternative etiologies of chest pain   2. Unwitnessed syncope  - We will observe on telemetry during her inpatient stay - Consider a cardiac monitor has an outpatient     Severity of Illness: The appropriate patient status for this patient is OBSERVATION. Observation status is judged to be reasonable and necessary in order to provide the required intensity of service to ensure the patient's safety. The patient's presenting symptoms, physical exam findings, and initial radiographic and laboratory data in the context of their medical condition is felt to place them at decreased risk for further clinical deterioration. Furthermore, it is anticipated that the patient will be medically stable for discharge from the hospital within 2 midnights of admission. The following factors support the patient status of observation.   " The patient's presenting symptoms include chest pain. " The physical exam findings include normal cardiac auscultation. " The initial radiographic and laboratory data are unremarkable.     For questions or updates, please contact  [place] Please consult [email or web address] for contact info under Cardiology/STEMI.    Signed, Meade Maw, MD  05/04/2018 2:53 AM

## 2018-05-04 NOTE — ED Provider Notes (Signed)
[facility] EMERGENCY DEPARTMENT Provider Note   CSN: [id number] Arrival date & time: 05/04/18  0020     History   Chief Complaint No chief complaint on file.   HPI Sarah Ray is a 77 y.o. female.  The history is provided by the patient.  Chest Pain   This is a recurrent problem. The current episode started more than 1 week ago. The problem occurs daily. The problem has been gradually worsening. The pain is associated with rest and exertion. The pain is present in the substernal region. The pain is severe. The quality of the pain is described as exertional. The pain radiates to the left shoulder and right shoulder. Associated symptoms include diaphoresis and syncope. Pertinent negatives include no abdominal pain, no back pain, no cough, no fever, no hemoptysis, no numbness, no orthopnea and no palpitations. She has tried nitroglycerin for the symptoms. The treatment provided no relief. Risk factors include being elderly.  Her past medical history is significant for MI.  Pertinent negatives for family medical history include: no Marfan's syndrome.  Procedure history is positive for cardiac catheterization.    Past Medical History:  Diagnosis Date  . Adenomatous polyps 07/1997  . Breast fibroadenoma   . CAD (coronary artery disease) 03/18/14   STEMI- stent to RCA and nonobstructive disease LAD and LCX  . External hemorrhoids   . GERD (gastroesophageal reflux disease)    LA Class Grade C Erosive esophagitis  . Hiatal hernia   . HTN (hypertension)   . Hyperlipidemia   . Internal hemorrhoids   . Osteopenia 2017   T score -2.2 FRAX 21%/5.8%  . Peptic stricture of esophagus   . Skin cancer 2014   "forehead"  . ST elevation myocardial infarction (STEMI) involving right coronary artery in recovery phase ([place]) 03/18/14    Patient Active Problem List   Diagnosis Date Noted  . Multiple thyroid nodules 12/10/2015  . Lung nodule < 6cm on CT 06/11/2015  . Chest  pain with moderate risk for cardiac etiology 02/11/2015  . HTN (hypertension)   . Chest pain 04/22/2014  . STEMI (ST elevation myocardial infarction) ([place]) 03/18/2014  . UTI (urinary tract infection) 03/18/2014  . History of allergy to aspirin- Hives, edema 03/18/2014  . Acute MI, inferolateral wall, initial episode of care ([place]) 03/18/2014  . Inferolateral myocardial infarction ([place]) 03/18/2014  . CAD (coronary artery disease) 03/18/2014  . Osteopenia   . Breast fibroadenoma   . Hx of adenomatous colonic polyps 11/15/2007  . Unspecified disorder of thyroid 11/15/2007  . HYPERCHOLESTEROLEMIA 11/15/2007  . INTERNAL HEMORRHOIDS 11/15/2007  . HEMORRHOIDS, EXTERNAL 11/15/2007  . GERD 11/15/2007  . PEPTIC STRICTURE 11/15/2007  . HIATAL HERNIA 11/15/2007    Past Surgical History:  Procedure Laterality Date  . ABDOMINAL HYSTERECTOMY  1977   Leiomyoma  . BREAST CYST ASPIRATION Right ~ 2011  . BUNIONECTOMY Bilateral 04/04/2013  . CARDIAC CATHETERIZATION N/A 09/29/2016   Procedure: Left Heart Cath and Coronary Angiography;  Surgeon: Belva Crome, MD;  Location: [facility];  Service: Cardiovascular;  Laterality: N/A;  . CORONARY ANGIOPLASTY WITH STENT PLACEMENT  03/18/14   Xience DES to totally occl RCA, residual non obstructive disease to LAD and LCX  . ESOPHAGOGASTRODUODENOSCOPY (EGD) WITH ESOPHAGEAL DILATION  2000's X 1  . LEFT HEART CATH N/A 03/18/2014   Procedure: LEFT HEART CATH;  Surgeon: Blane Ohara, MD;  Location: [facility] CATH LAB;  Service: Cardiovascular;  Laterality: N/A;  . LEFT HEART  CATHETERIZATION WITH CORONARY ANGIOGRAM N/A 04/22/2014   Procedure: LEFT HEART CATHETERIZATION WITH CORONARY ANGIOGRAM;  Surgeon: Blane Ohara, MD;  Location: [facility] CATH LAB;  Service: Cardiovascular;  Laterality: N/A;  . SKIN CANCER EXCISION  2014  . THYROID CYST EXCISION  1970's  . TONSILLECTOMY AND ADENOIDECTOMY  ~ 1950     OB History    Gravida  2   Para  2   Term  2   Preterm        AB      Living  2     SAB      TAB      Ectopic      Multiple      Live Births               Home Medications    Prior to Admission medications   Medication Sig Start Date End Date Taking? Authorizing Provider  b complex vitamins tablet Take 1 tablet by mouth daily.   Yes [provider]  CALCIUM-VITAMIN D PO Take 1 tablet by mouth daily.   Yes [provider]  carvedilol (COREG) 3.125 MG tablet TAKE 1 TABLET(3.125 MG) BY MOUTH TWICE DAILY Patient taking differently: Take 3.125 mg by mouth 2 (two) times daily with a meal.  06/14/17  Yes [name], MD  clopidogrel (PLAVIX) 75 MG tablet TAKE 1 TABLET(75 MG) BY MOUTH AT BEDTIME Patient taking differently: Take 75 mg by mouth daily.  05/02/18  Yes [name], MD  nitroGLYCERIN (NITROSTAT) 0.4 MG SL tablet Place 0.4 mg under the tongue every 5 (five) minutes as needed for chest pain.   Yes [provider]  Omega-3 Fatty Acids (FISH OIL PO) Take 1 capsule by mouth daily.   Yes [provider]  pantoprazole (PROTONIX) 40 MG tablet Take 1 tablet (40 mg total) by mouth daily. 05/02/18  Yes Ladene Artist, MD  rosuvastatin (CRESTOR) 5 MG tablet Take 1 tablet (5 mg total) by mouth every other day. Patient taking differently: Take 5 mg by mouth See admin instructions. Four times weekly 10/27/16 05/04/18 Yes [name], MD    Family History Family History  Problem Relation Age of Onset  . Colon polyps Mother   . Colon cancer Maternal Aunt   . Breast cancer Maternal Aunt        Age 69  . Breast cancer Daughter        Age 42  . Heart disease Brother        irregular heart beat    Social History Social History   Tobacco Use  . Smoking status: Never Smoker  . Smokeless tobacco: Never Used  Substance Use Topics  . Alcohol use: No    Alcohol/week: 0.0 standard drinks  . Drug use: No     Allergies   Brilinta [ticagrelor]; Naprosyn [naproxen]; Aspirin; Lipitor  [atorvastatin]; Other; and Gabapentin   Review of Systems Review of Systems  Constitutional: Positive for diaphoresis. Negative for fever.  Respiratory: Negative for cough and hemoptysis.   Cardiovascular: Positive for chest pain and syncope. Negative for palpitations, orthopnea and leg swelling.  Gastrointestinal: Negative for abdominal pain.  Musculoskeletal: Negative for back pain.  Neurological: Negative for numbness.  All other systems reviewed and are negative.    Physical Exam Updated Vital Signs BP (!) 162/97 (BP Location: Left Arm)   Pulse 74   Temp 98 F (36.7 C) (Oral)   Resp 14   Ht 5\' 1"  (1.549  m)   Wt 57.6 kg   SpO2 100%   BMI 24.00 kg/m   Physical Exam  Constitutional: She is oriented to person, place, and time. She appears well-developed and well-nourished. No distress.  HENT:  Head: Normocephalic and atraumatic.  Mouth/Throat: No oropharyngeal exudate.  Eyes: Pupils are equal, round, and reactive to light. Conjunctivae are normal.  Neck: Normal range of motion. Neck supple.  Cardiovascular: Normal rate, regular rhythm, normal heart sounds and intact distal pulses.  Pulmonary/Chest: Effort normal and breath sounds normal. No stridor. She has no wheezes. She has no rales.  Abdominal: Soft. Bowel sounds are normal. She exhibits no mass. There is no tenderness. There is no rebound and no guarding.  Musculoskeletal: Normal range of motion.  Neurological: She is alert and oriented to person, place, and time. She displays normal reflexes.  Skin: Skin is warm and dry. Capillary refill takes less than 2 seconds.  Psychiatric: She has a normal mood and affect.     ED Treatments / Results  Labs (all labs ordered are listed, but only abnormal results are displayed) Results for orders placed or performed during the hospital encounter of 05/04/18  CBC with Differential/Platelet  Result Value Ref Range   WBC 4.9 4.0 - 10.5 K/uL   RBC 4.10 3.87 - 5.11 MIL/uL    Hemoglobin 13.5 12.0 - 15.0 g/dL   HCT 40.9 36.0 - 46.0 %   MCV 99.8 78.0 - 100.0 fL   MCH 32.9 26.0 - 34.0 pg   MCHC 33.0 30.0 - 36.0 g/dL   RDW 12.1 11.5 - 15.5 %   Platelets 198 150 - 400 K/uL   Neutrophils Relative % 62 %   Neutro Abs 3.1 1.7 - 7.7 K/uL   Lymphocytes Relative 23 %   Lymphs Abs 1.1 0.7 - 4.0 K/uL   Monocytes Relative 12 %   Monocytes Absolute 0.6 0.1 - 1.0 K/uL   Eosinophils Relative 2 %   Eosinophils Absolute 0.1 0.0 - 0.7 K/uL   Basophils Relative 1 %   Basophils Absolute 0.0 0.0 - 0.1 K/uL   Immature Granulocytes 0 %   Abs Immature Granulocytes 0.0 0.0 - 0.1 K/uL  D-dimer, quantitative (not at [facility])  Result Value Ref Range   D-Dimer, Quant <0.27 0.00 - 0.50 ug/mL-FEU  I-Stat Chem 8, ED  Result Value Ref Range   Sodium 140 135 - 145 mmol/L   Potassium 3.7 3.5 - 5.1 mmol/L   Chloride 104 98 - 111 mmol/L   BUN 13 8 - 23 mg/dL   Creatinine, Ser 0.90 0.44 - 1.00 mg/dL   Glucose, Bld 103 (H) 70 - 99 mg/dL   Calcium, Ion 1.20 1.15 - 1.40 mmol/L   TCO2 24 22 - 32 mmol/L   Hemoglobin 12.9 12.0 - 15.0 g/dL   HCT 38.0 36.0 - 46.0 %  I-stat troponin, ED  Result Value Ref Range   Troponin i, poc 0.00 0.00 - 0.08 ng/mL   Comment 3           Dg Chest Portable 1 View  Result Date: 05/04/2018 CLINICAL DATA:  Chest pain EXAM: PORTABLE CHEST 1 VIEW COMPARISON:  10/24/2016 FINDINGS: The heart size and mediastinal contours are within normal limits. Both lungs are clear. The visualized skeletal structures are unremarkable. IMPRESSION: No active disease. Electronically Signed   By: Inez Catalina M.D.   On: 05/04/2018 01:01    EKG EKG Interpretation  Date/Time:  Thursday May 04 2018 00:26:31 EDT Ventricular Rate:  73 PR Interval:    QRS Duration: 101 QT Interval:  389 QTC Calculation: 429 R Axis:   -8 Text Interpretation:  Sinus rhythm RSR' in V1 or V2, right VCD or RVH Confirmed by Randal Buba, Avion Kutzer ([id number]) on 05/04/2018 1:22:59 AM   Radiology Dg Chest Portable  1 View  Result Date: 05/04/2018 CLINICAL DATA:  Chest pain EXAM: PORTABLE CHEST 1 VIEW COMPARISON:  10/24/2016 FINDINGS: The heart size and mediastinal contours are within normal limits. Both lungs are clear. The visualized skeletal structures are unremarkable. IMPRESSION: No active disease. Electronically Signed   By: Inez Catalina M.D.   On: 05/04/2018 01:01    Procedures Procedures (including critical care time)  Medications Ordered in ED Medications  fentaNYL (SUBLIMAZE) injection 50 mcg (has no administration in time range)  gi cocktail (Maalox,Lidocaine,Donnatal) (has no administration in time range)      Final Clinical Impressions(s) / ED Diagnoses   Given heart score and [place] syncope score this patient needs inpatient care   Ailish Prospero, MD 05/04/18 0126

## 2018-05-04 NOTE — Discharge Instructions (Signed)
PLEASE REMEMBER TO BRING ALL OF YOUR MEDICATIONS TO EACH OF YOUR FOLLOW-UP OFFICE VISITS.  PLEASE ATTEND ALL SCHEDULED FOLLOW-UP APPOINTMENTS.   It is likely that the pain you experienced was related to acid reflux/ stomach trouble. Please pick up some zantac (ranitidine) or pepcid (famotidine) over the counter and take daily to help with acid reflux. Please continue to follow-up outpatient with your gastroenterology provider.

## 2018-05-04 NOTE — Progress Notes (Signed)
  Echocardiogram 2D Echocardiogram has been performed.  Sarah Ray 05/04/2018, 10:21 AM

## 2018-05-04 NOTE — Progress Notes (Addendum)
Patient symptoms have improved overnight.  She reports that the GI cocktail administered in the emergency room completely relieved her symptoms temporarily. Her syncopal event is highly suggestive of vasovagal syncope.  It occurred first thing in the morning, shortly after she got out of bed, following micturition.  It was preceded by blurry vision, flushing, diaphoresis.  It was relieved by laying flat.  A similar event occurred during dehydration about a year ago. ECG is normal.   Cardiac troponin so far negative, which should be representative even after one sample since symptoms have been ongoing for 3 days. Echo shows normal regional wall motion and left ventricular systolic function and normal pulmonary artery pressure. I do not think further arrhythmia monitoring would be necessary since her symptoms are so typical for a vagal event. If second troponin assay is normal will discharge home today.  Suspect her symptoms may be related to hiatal hernia and gastroesophageal reflux.  She has an appoint with Dr. Fuller Plan in the near future.  Suggested that she can take an over-the-counter H2 blocker such as famotidine or ranitidine at bedtime, in addition to her pantoprazole, in the meantime.  Sanda Klein, MD, [facility] [phone] office [phone] pager

## 2018-05-10 ENCOUNTER — Encounter: Payer: Self-pay | Admitting: Gynecology

## 2018-05-10 DIAGNOSIS — M8589 Other specified disorders of bone density and structure, multiple sites: Secondary | ICD-10-CM | POA: Diagnosis not present

## 2018-05-10 DIAGNOSIS — Z803 Family history of malignant neoplasm of breast: Secondary | ICD-10-CM | POA: Diagnosis not present

## 2018-05-10 DIAGNOSIS — Z1231 Encounter for screening mammogram for malignant neoplasm of breast: Secondary | ICD-10-CM | POA: Diagnosis not present

## 2018-05-11 ENCOUNTER — Encounter: Payer: Self-pay | Admitting: Nurse Practitioner

## 2018-05-11 ENCOUNTER — Telehealth: Payer: Self-pay

## 2018-05-11 ENCOUNTER — Ambulatory Visit: Payer: PPO | Admitting: Nurse Practitioner

## 2018-05-11 ENCOUNTER — Encounter: Payer: Self-pay | Admitting: Gynecology

## 2018-05-11 VITALS — BP 110/70 | HR 49 | Ht 61.0 in | Wt 129.0 lb

## 2018-05-11 DIAGNOSIS — R079 Chest pain, unspecified: Secondary | ICD-10-CM

## 2018-05-11 NOTE — Patient Instructions (Signed)
If you are age 79 or older, your body mass index should be between 23-30. Your Body mass index is 24.37 kg/m. If this is out of the aforementioned range listed, please consider follow up with your Primary Care Provider.  If you are age 60 or younger, your body mass index should be between 19-25. Your Body mass index is 24.37 kg/m. If this is out of the aformentioned range listed, please consider follow up with your Primary Care Provider.   You have been scheduled for an endoscopy. Please follow written instructions given to you at your visit today. If you use inhalers (even only as needed), please bring them with you on the day of your procedure. Your physician has requested that you go to www.startemmi.com and enter the access code given to you at your visit today. This web site gives a general overview about your procedure. However, you should still follow specific instructions given to you by our office regarding your preparation for the procedure.  You will be contacted by our office prior to your procedure for directions on holding your Plavix.  If you do not hear from our office 1 week prior to your scheduled procedure, please call [phone] to discuss.   Thank you for choosing me and [facility].   Tye Savoy, NP

## 2018-05-11 NOTE — Progress Notes (Signed)
P Primary GI:  Lucio Edward, MD       Chief Complaint:    Chest pain   IMPRESSION and PLAN:    #85.  77 year old female with long-standing GERD / esophagitis on last EGD in 2008, maintained on daily PPI for years.    #2. Chronic chest pain.  Hospitalized a week ago with atypical chest pain, cardiac work-up negative though she does have a history of CAD with stent placement in 2015 and is maintained on Plavix.  Patient really feels that her chest discomfort is secondary to anti-lipid medications as the frequency / intensity of the pain correlates with changes in dosage.  She has tried different statins through the years, currently takes a very small dose of Crestor.  -It has been over 10 years since her last EGD and she does have a history of severe esophagitis.  It is reasonable to proceed with an EGD to evaluate for other etiologies of her chest pain.  -The risks and benefits of EGD were discussed and the patient agrees to proceed.    #3.  CAD / stents in 2015. On plavix -Hold Plavix for 5 days before procedure - will instruct when and how to resume after procedure. Patient understands that there is a low but real risk of cardiovascular event such as heart attack, stroke, or embolism /  thrombosis, or ischemia while off Plavix. The patient consents to proceed. Will communicate by phone or EMR with patient's prescribing provider to confirm that holding Plavix is reasonable in this case.        HPI:     Patient is a 77 year old female with a history of GERD.  She had severe reflux esophagitis as well as an esophageal stricture on her last endoscopy in 2008.  She has taken daily Protonix every day for years.  Ms. Males also has a history of coronary artery disease, status post stent placement 2015.  She gives a 4-year history of chest discomfort, it really started after her stents were placed.  She was apparently started on a statin at the time and correlates onset of pain with statin.   She is on Crestor and the dose has been reduced significantly over time with a correlating reduction in chest discomfort. Dose was recently increased and she had worsening chest pain again.  She describes chest pain as being a burning sensation , 4-5 on the pain scale.  The pain may start in her shoulders and go through to her chest or vice versa.  The discomfort radiates down into both arms at times.  She thinks pain may be aggravated by eating but can't say for sure. Pain is not exertional. No associated SOB. She was hospitalized overnight last week for evaluation of atypical chest pain and episode of syncope at home.  Troponins were negative, no arrhythmias on telemetry.   Review of systems:    no SOB, no fevers, no urinary sx   Past Medical History:  Diagnosis Date  . Adenomatous polyps 07/1997  . Breast fibroadenoma   . CAD (coronary artery disease) 03/18/14   STEMI- stent to RCA and nonobstructive disease LAD and LCX  . External hemorrhoids   . GERD (gastroesophageal reflux disease)    LA Class Grade C Erosive esophagitis  . Hiatal hernia   . HTN (hypertension)   . Hyperlipidemia   . Internal hemorrhoids   . Osteopenia 2017   T score -2.2 FRAX 21%/5.8%  . Peptic stricture of esophagus   .  Skin cancer 2014   "forehead"  . ST elevation myocardial infarction (STEMI) involving right coronary artery in recovery phase ([place]) 03/18/14    Patient's surgical history, family medical history, social history, medications and allergies were all reviewed in Epic   Serum creatinine: 0.88 mg/dL 05/04/18 0442 Estimated creatinine clearance: 42.6 mL/min  Current Outpatient Medications  Medication Sig Dispense Refill  . b complex vitamins tablet Take 1 tablet by mouth daily.    Marland Kitchen CALCIUM-VITAMIN D PO Take 1 tablet by mouth daily.    . carvedilol (COREG) 3.125 MG tablet TAKE 1 TABLET(3.125 MG) BY MOUTH TWICE DAILY (Patient taking differently: Take 3.125 mg by mouth 2 (two) times daily with a meal.  ) 180 tablet 3  . clopidogrel (PLAVIX) 75 MG tablet TAKE 1 TABLET(75 MG) BY MOUTH AT BEDTIME (Patient taking differently: Take 75 mg by mouth daily. ) 90 tablet 2  . nitroGLYCERIN (NITROSTAT) 0.4 MG SL tablet Place 0.4 mg under the tongue every 5 (five) minutes as needed for chest pain.    . Omega-3 Fatty Acids (FISH OIL PO) Take 1 capsule by mouth daily.    . pantoprazole (PROTONIX) 40 MG tablet Take 1 tablet (40 mg total) by mouth daily. 30 tablet 1  . rosuvastatin (CRESTOR) 5 MG tablet Take 1 tablet (5 mg total) by mouth every other day. (Patient taking differently: Take 5 mg by mouth See admin instructions. Four times weekly) 45 tablet 3   No current facility-administered medications for this visit.     Physical Exam:     BP 110/70   Pulse (!) 49   Ht 5\' 1"  (1.549 m)   Wt 129 lb (58.5 kg)   BMI 24.37 kg/m   GENERAL:  Pleasant female in NAD PSYCH: : Cooperative, normal affect EENT:  conjunctiva pink, mucous membranes moist, neck supple without masses CARDIAC:  RRR, no murmur heard, no peripheral edema PULM: Normal respiratory effort, lungs CTA bilaterally, no wheezing ABDOMEN:  Nondistended, soft, nontender. No obvious masses, no hepatomegaly,  normal bowel sounds SKIN:  turgor, no lesions seen Musculoskeletal:  Normal muscle tone, normal strength NEURO: Alert and oriented x 3, no focal neurologic deficits   Tye Savoy , NP 05/11/2018, 8:50 AM

## 2018-05-11 NOTE — Telephone Encounter (Signed)
Ok to hold plavix for 5 days before EGD

## 2018-05-11 NOTE — Telephone Encounter (Signed)
   Primary Cardiologist:Peter Johnsie Cancel, MD  Routing clearance to request to Dr. Johnsie Cancel to see if ok for pt to hold Plavix for EGD.    Lyda Jester, PA-C 05/11/2018, 2:36 PM

## 2018-05-11 NOTE — Telephone Encounter (Signed)
[facility] Pre-operative Risk Assessment     Request for surgical clearance:     Endoscopy Procedure  What type of surgery is being performed?     EGD  When is this surgery scheduled?     06/19/18  What type of clearance is required ?   Pharmacy  Are there any medications that need to be held prior to surgery and how long? HOLD PLAVIX FOR 5 DAYS  Practice name and name of physician performing surgery?      [facility]/Dr. Fuller Plan  What is your office phone and fax number?      Phone- [phone]  [phone]  Anesthesia type (None, local, MAC, general) ?       MAC

## 2018-05-12 ENCOUNTER — Telehealth: Payer: Self-pay

## 2018-05-12 NOTE — Telephone Encounter (Signed)
Called patient regarding holding Plavix 5 days prior to EGD.  Ok to hold per Dr. Johnsie Cancel.  Patient verbalized understanding.

## 2018-05-14 ENCOUNTER — Encounter: Payer: Self-pay | Admitting: Nurse Practitioner

## 2018-05-15 ENCOUNTER — Encounter: Payer: Self-pay | Admitting: Gynecology

## 2018-05-15 ENCOUNTER — Ambulatory Visit: Payer: PPO | Admitting: Gynecology

## 2018-05-15 VITALS — BP 136/84 | Ht 60.5 in | Wt 121.0 lb

## 2018-05-15 DIAGNOSIS — Z01411 Encounter for gynecological examination (general) (routine) with abnormal findings: Secondary | ICD-10-CM

## 2018-05-15 DIAGNOSIS — N8189 Other female genital prolapse: Secondary | ICD-10-CM

## 2018-05-15 DIAGNOSIS — Z9189 Other specified personal risk factors, not elsewhere classified: Secondary | ICD-10-CM | POA: Diagnosis not present

## 2018-05-15 DIAGNOSIS — M8580 Other specified disorders of bone density and structure, unspecified site: Secondary | ICD-10-CM

## 2018-05-15 NOTE — Progress Notes (Signed)
Reviewed and agree with management plan.  Malcolm T. Stark, MD FACG 

## 2018-05-15 NOTE — Patient Instructions (Signed)
Follow up in one year for annual exam 

## 2018-05-15 NOTE — Progress Notes (Signed)
    Sarah Ray [birth year] [id number]        77 y.o.  G2P2002 for breast and pelvic exam.  History of enterocele.  Patient notes some pelvic pressure with heavy lifting or constipation but no significant symptoms.  Past medical history,surgical history, problem list, medications, allergies, family history and social history were all reviewed and documented as reviewed in the EPIC chart.  ROS:  Performed with pertinent positives and negatives included in the history, assessment and plan.   Additional significant findings : None   Exam: Caryn Bee assistant Vitals:   05/15/18 0954  BP: 136/84  Weight: 121 lb (54.9 kg)  Height: 5' 0.5" (1.537 m)   Body mass index is 23.24 kg/m.  General appearance:  Normal affect, orientation and appearance. Skin: Grossly normal HEENT: Without gross lesions.  No cervical or supraclavicular adenopathy. Thyroid normal.  Lungs:  Clear without wheezing, rales or rhonchi Cardiac: RR, without RMG Abdominal:  Soft, nontender, without masses, guarding, rebound, organomegaly or hernia Breasts:  Examined lying and sitting without masses, retractions, discharge or axillary adenopathy. Pelvic:  Ext, BUS, Vagina: With atrophic changes.  Vaginal prolapse to 2 fingerbreadths from the introitus.  Appears to have cystocele component along with questionable enterocele/cuff involvement.  No significant rectocele.  Adnexa: Without masses or tenderness    Anus and perineum: Normal   Rectovaginal: Normal sphincter tone without palpated masses or tenderness.    Assessment/Plan:  77 y.o. G72P2002 female for breast and pelvic exam status post TAH for leiomyoma.  1. Pelvic relaxation with cystocele/cuff/enterocele component.  To within 2 fingerbreadths of the vagina.  Patient without significant symptoms.  We discussed the situation and the options to include observation, pessary and surgery.  At this point the patient is not bothered by the situation and prefers  observation.  She will follow-up if symptoms develop and we reviewed what to look out for. 2. Osteopenia.  DEXA 2019 T score -1.9 FRAX 21% / 5%.  I had just emailed her to this morning not knowing I was seeing her today.  We discussed these results.  Study is overall stable.  We previously had discussed her increased FRAX and options for medication treatment and she declined.  We again discussed the option today for medication treatment based on her elevated FRAX and she declines.  She is active with weightbearing exercise and supplements calcium/vitamin D.  Will repeat DEXA in 2 years. 3. Colonoscopy 2015.  Repeat at their recommended interval. 4. Mammography 04/2018.  Continue with annual mammography next year.  Breast exam normal today. 5. Pap smear 2011.  No Pap smear done today.  No history of significant abnormal Pap smear.  We both agree to stop screening per current screening guidelines based on age and hysterectomy history. 6. Health maintenance.  No routine lab work done as patient does this elsewhere.  Follow-up 1 year, sooner as needed.   Anastasio Auerbach MD, 10:26 AM 05/15/2018

## 2018-05-16 NOTE — Telephone Encounter (Signed)
APPT SCHEDULED WITH LUKE 9-3

## 2018-05-16 NOTE — Telephone Encounter (Signed)
   Primary Cardiologist:Peter Johnsie Cancel, MD  Chart reviewed as part of pre-operative protocol coverage. Because of Suezette Lafave Chachere's past medical history and time since last visit, he/she will require a follow-up visit in order to better assess preoperative cardiovascular risk.  She states she has been having chest pressure that she relates to statin use. She has recently taken 5 mg of lipitor twice in a row. Given her recent syncope, chest pressure, and ER visit, I would like to see her in the office prior to her upcoming EGD procedure.  Pre-op covering staff: - Please schedule appointment and call patient to inform them. - Please contact requesting surgeon's office via preferred method (i.e, phone, fax) to inform them of need for appointment prior to surgery.  Tami Lin Duke, PA  05/16/2018, 4:26 PM

## 2018-05-16 NOTE — Telephone Encounter (Signed)
Forwarded to requesting party via EPIC fax function 

## 2018-05-23 ENCOUNTER — Ambulatory Visit: Payer: PPO | Admitting: Cardiology

## 2018-05-23 ENCOUNTER — Encounter: Payer: Self-pay | Admitting: Cardiology

## 2018-05-23 DIAGNOSIS — Z9861 Coronary angioplasty status: Secondary | ICD-10-CM | POA: Diagnosis not present

## 2018-05-23 DIAGNOSIS — I2510 Atherosclerotic heart disease of native coronary artery without angina pectoris: Secondary | ICD-10-CM

## 2018-05-23 DIAGNOSIS — E785 Hyperlipidemia, unspecified: Secondary | ICD-10-CM | POA: Diagnosis not present

## 2018-05-23 DIAGNOSIS — R0789 Other chest pain: Secondary | ICD-10-CM

## 2018-05-23 DIAGNOSIS — K219 Gastro-esophageal reflux disease without esophagitis: Secondary | ICD-10-CM

## 2018-05-23 NOTE — Assessment & Plan Note (Signed)
Pt has statin intolerance (myalgias). She is able to tolerate QOD low dose Crestor but her LDL in not well controlled- LDL 110 May 2019

## 2018-05-23 NOTE — Progress Notes (Signed)
05/23/2018 Sarah Ray   09/17/1941  [id number]  Primary Physician Raina Mina., MD Primary Cardiologist: Dr Johnsie Cancel  HPI:  77 y/o female with a history of CAD, s/p MI-RCA PCI June 2015. Cath done Jan 2018 showed patent RCA sites and non obstructive residual CAD. She was recently admitted 05/04/18 with chest pain felt to be non cardiac. The pt says she had tried to increase her statin frequency and attributes her symptoms to this. She feels better now that she is back on QOD Crestor. She has known GERD and stricture and needs an endoscopy. Dr Johnsie Cancel has indicated her feels she can hold her Plavix for this.    Current Outpatient Medications  Medication Sig Dispense Refill  . b complex vitamins tablet Take 1 tablet by mouth daily.    Marland Kitchen CALCIUM-VITAMIN D PO Take 1 tablet by mouth daily.    . carvedilol (COREG) 3.125 MG tablet TAKE 1 TABLET(3.125 MG) BY MOUTH TWICE DAILY (Patient taking differently: Take 3.125 mg by mouth 2 (two) times daily with a meal. ) 180 tablet 3  . clopidogrel (PLAVIX) 75 MG tablet TAKE 1 TABLET(75 MG) BY MOUTH AT BEDTIME (Patient taking differently: Take 75 mg by mouth daily. ) 90 tablet 2  . nitroGLYCERIN (NITROSTAT) 0.4 MG SL tablet Place 0.4 mg under the tongue every 5 (five) minutes as needed for chest pain.    . Omega-3 Fatty Acids (FISH OIL PO) Take 1 capsule by mouth daily.    . pantoprazole (PROTONIX) 40 MG tablet Take 1 tablet (40 mg total) by mouth daily. 30 tablet 1  . rosuvastatin (CRESTOR) 5 MG tablet Take 1 tablet (5 mg total) by mouth every other day. (Patient taking differently: Take 5 mg by mouth See admin instructions. Four times weekly) 45 tablet 3   No current facility-administered medications for this visit.     Allergies  Allergen Reactions  . Brilinta [Ticagrelor] Shortness Of Breath and Other (See Comments)    Caused chest pains  . Naprosyn [Naproxen] Hives  . Aspirin Hives, Diarrhea and Swelling    Benadryl stops reaction  . Lipitor  [Atorvastatin] Other (See Comments)    Myalgias and leg heaviness   . Other Other (See Comments)    Very sensitive to all meds  . Gabapentin     dizziness    Past Medical History:  Diagnosis Date  . Adenomatous polyps 07/1997  . Breast fibroadenoma   . CAD (coronary artery disease) 03/18/14   STEMI- stent to RCA and nonobstructive disease LAD and LCX  . External hemorrhoids   . GERD (gastroesophageal reflux disease)    LA Class Grade C Erosive esophagitis  . Hiatal hernia   . HTN (hypertension)   . Hyperlipidemia   . Internal hemorrhoids   . Osteopenia 04/2018   T score -1.9 FRAX 21% / 5%  . Peptic stricture of esophagus   . Skin cancer 2014   "forehead"  . ST elevation myocardial infarction (STEMI) involving right coronary artery in recovery phase ([place]) 03/18/14    Social History   Socioeconomic History  . Marital status: Married    Spouse name: [name]  . Number of children: 2  . Years of education: Associates  . Highest education level: Not on file  Occupational History  . Occupation: Dance movement psychotherapist  Social Needs  . Financial resource strain: Not on file  . Food insecurity:    Worry: Not on file    Inability: Not on file  .  Transportation needs:    Medical: Not on file    Non-medical: Not on file  Tobacco Use  . Smoking status: Never Smoker  . Smokeless tobacco: Never Used  Substance and Sexual Activity  . Alcohol use: No    Alcohol/week: 0.0 standard drinks  . Drug use: No  . Sexual activity: Not Currently    Birth control/protection: Surgical, Post-menopausal    Comment: HYST-1st intercourse 77 yo-Fewer than 5 partners  Lifestyle  . Physical activity:    Days per week: Not on file    Minutes per session: Not on file  . Stress: Not on file  Relationships  . Social connections:    Talks on phone: Not on file    Gets together: Not on file    Attends religious service: Not on file    Active member of club or organization: Not on file    Attends  meetings of clubs or organizations: Not on file    Relationship status: Not on file  . Intimate partner violence:    Fear of current or ex partner: Not on file    Emotionally abused: Not on file    Physically abused: Not on file    Forced sexual activity: Not on file  Other Topics Concern  . Not on file  Social History Narrative   Lives with husband   Daily caffeine:  1 cup per day (coffee or tea)     Family History  Problem Relation Age of Onset  . Colon polyps Mother   . Colon cancer Maternal Aunt   . Breast cancer Maternal Aunt        Age 42  . Breast cancer Daughter        Age 19  . Heart disease Brother        irregular heart beat     Review of Systems: General: negative for chills, fever, night sweats or weight changes.  Cardiovascular: negative for chest pain, dyspnea on exertion, edema, orthopnea, palpitations, paroxysmal nocturnal dyspnea or shortness of breath Dermatological: negative for rash Respiratory: negative for cough or wheezing Urologic: negative for hematuria Abdominal: negative for nausea, vomiting, diarrhea, bright red blood per rectum, melena, or hematemesis Neurologic: negative for visual changes, syncope, or dizziness All other systems reviewed and are otherwise negative except as noted above.    Blood pressure 132/80, pulse 63, height 5' (1.524 m), weight 129 lb 12.8 oz (58.9 kg), SpO2 98 %.  General appearance: alert, cooperative and no distress Neck: no carotid bruit and no JVD Lungs: clear to auscultation bilaterally Heart: regular rate and rhythm Skin: Skin color, texture, turgor normal. No rashes or lesions Neurologic: Grossly normal   ASSESSMENT AND PLAN:   CAD S/P percutaneous coronary angioplasty [facility] PCI-stent 2015 Cath Jan 2018- stent to RCA patent and nonobstructive disease LAD and LCX  Chest pain Recent hospitalization for chest pain- MI R/O. Felt to be non cardiac  Dyslipidemia, goal LDL below 70 Pt has statin  intolerance (myalgias). She is able to tolerate QOD low dose Crestor but her LDL in not well controlled- LDL 110 May 2019  GERD H/O erosive esophagitis and esophageal stricture   PLAN  OK to hold Plavix for endoscopy. I have referred her to the [facility]. F/U Dr Johnsie Cancel as scheduled.   Kerin Ransom PA-C 05/23/2018 4:29 PM

## 2018-05-23 NOTE — Assessment & Plan Note (Signed)
H/O erosive esophagitis and esophageal stricture

## 2018-05-23 NOTE — Patient Instructions (Signed)
Medication Instructions:  No Change. If you need a refill on your cardiac medications before your next appointment, please call your pharmacy.  Labwork: None Ordered.  Testing/Procedures: None Ordered.  Follow-Up: Your physician wants you to follow-up with: [facility] at [facility] before upcoming appointment with Dr.Nishan.   Thank you for choosing [facility] at [facility]!!

## 2018-05-23 NOTE — Assessment & Plan Note (Addendum)
[facility] PCI-stent 0623 Cath Jan 2018- stent to RCA patent and nonobstructive disease LAD and LCX

## 2018-05-23 NOTE — Assessment & Plan Note (Signed)
Recent hospitalization for chest pain- MI R/O. Felt to be non cardiac

## 2018-05-25 ENCOUNTER — Ambulatory Visit: Payer: PPO | Admitting: Gastroenterology

## 2018-05-26 NOTE — Addendum Note (Signed)
Addended by: Ulice Brilliant T on: 05/26/2018 09:58 AM   Modules accepted: Orders

## 2018-06-05 ENCOUNTER — Encounter: Payer: Self-pay | Admitting: Gastroenterology

## 2018-06-08 ENCOUNTER — Other Ambulatory Visit: Payer: Self-pay | Admitting: Cardiovascular Disease

## 2018-06-14 DIAGNOSIS — I10 Essential (primary) hypertension: Secondary | ICD-10-CM | POA: Diagnosis not present

## 2018-06-14 DIAGNOSIS — I2510 Atherosclerotic heart disease of native coronary artery without angina pectoris: Secondary | ICD-10-CM | POA: Diagnosis not present

## 2018-06-14 DIAGNOSIS — K219 Gastro-esophageal reflux disease without esophagitis: Secondary | ICD-10-CM | POA: Diagnosis not present

## 2018-06-15 ENCOUNTER — Ambulatory Visit (INDEPENDENT_AMBULATORY_CARE_PROVIDER_SITE_OTHER): Payer: PPO | Admitting: Pharmacist

## 2018-06-15 ENCOUNTER — Telehealth: Payer: Self-pay | Admitting: Pharmacist

## 2018-06-15 DIAGNOSIS — E7800 Pure hypercholesterolemia, unspecified: Secondary | ICD-10-CM | POA: Diagnosis not present

## 2018-06-15 LAB — LIPID PANEL
CHOLESTEROL TOTAL: 173 mg/dL (ref 100–199)
Chol/HDL Ratio: 4.1 ratio (ref 0.0–4.4)
HDL: 42 mg/dL (ref >39)
LDL CALC: 104 mg/dL — AB (ref 0–99)
TRIGLYCERIDES: 135 mg/dL (ref 0–149)
VLDL CHOLESTEROL CAL: 27 mg/dL (ref 5–40)

## 2018-06-15 LAB — LDL CHOLESTEROL, DIRECT: LDL Direct: 112 mg/dL — ABNORMAL HIGH (ref 0–99)

## 2018-06-15 NOTE — Telephone Encounter (Signed)
Reviewed lab results with pt - LDL above goal < 70 at 112. Will start paperwork for Praluent coverage.

## 2018-06-15 NOTE — Progress Notes (Signed)
Patient ID: Sarah Ray                 DOB: 04-06-1941                    MRN: [id number]     HPI: Sarah Ray is a 77 y.o. female patient of Sarah Ray referred to lipid clinic by Sarah Ransom, PA. PMH is significant for CAD s/p MI with PCI to RCA in June 2015, HTN, HLD, and GERD. Pt has a history of statin intolerance and presents to lipid clinic for further management.  Pt presents today in good spirits. She is currently taking rosuvastatin 5mg  every other day (has been taking this for the past 2 months), however she has been experiencing heaviness and aching in her arms and legs. She also experienced this when she took atorvastatin, pravastatin, and simvastatin in the past. She stays active with cardiac rehab 5 days a week and eats a low fat diet.  Current Medications: fish oil 1g daily, rosuvastatin 5mg  every other day  Intolerances: atorvastatin 80mg  daily, pravastatin 20mg  daily, rosuvastatin 5mg  daily, simvastatin 20mg  daily  Risk Factors: CAD s/p MI and PCI, HTN, age  LDL goal: 70mg /dL  Diet: Likes fruits, vegetables, and seafood. Does like an egg and piece of bacon in the morning with fruit. Does have a sweet tooth. Eats whole grain carbs and low fat yogurt.  Exercise: Cardiac rehab 5 days a week. Housework at home - 2 flights of stairs  Family History: Brother with arrhythmia.  Social History: Denies tobacco, alcohol, and illicit drug use.  Labs: 06/17/16: TC 147, TG 82, HDL 53, LDL 78 (rosuvastatin 5mg  daily)   Past Medical History:  Diagnosis Date  . Adenomatous polyps 07/1997  . Breast fibroadenoma   . CAD (coronary artery disease) 03/18/14   STEMI- stent to RCA and nonobstructive disease LAD and LCX  . External hemorrhoids   . GERD (gastroesophageal reflux disease)    LA Class Grade C Erosive esophagitis  . Hiatal hernia   . HTN (hypertension)   . Hyperlipidemia   . Internal hemorrhoids   . Osteopenia 04/2018   T score -1.9 FRAX 21% / 5%  . Peptic stricture of  esophagus   . Skin cancer 2014   "forehead"  . ST elevation myocardial infarction (STEMI) involving right coronary artery in recovery phase ([place]) 03/18/14    Current Outpatient Medications on File Prior to Visit  Medication Sig Dispense Refill  . b complex vitamins tablet Take 1 tablet by mouth daily.    Marland Kitchen CALCIUM-VITAMIN D PO Take 1 tablet by mouth daily.    . carvedilol (COREG) 3.125 MG tablet TAKE 1 TABLET(3.125 MG) BY MOUTH TWICE DAILY 180 tablet 3  . clopidogrel (PLAVIX) 75 MG tablet TAKE 1 TABLET(75 MG) BY MOUTH AT BEDTIME (Patient taking differently: Take 75 mg by mouth daily. ) 90 tablet 2  . nitroGLYCERIN (NITROSTAT) 0.4 MG SL tablet Place 0.4 mg under the tongue every 5 (five) minutes as needed for chest pain.    . Omega-3 Fatty Acids (FISH OIL PO) Take 1 capsule by mouth daily.    . pantoprazole (PROTONIX) 40 MG tablet Take 1 tablet (40 mg total) by mouth daily. 30 tablet 1  . rosuvastatin (CRESTOR) 5 MG tablet Take 1 tablet (5 mg total) by mouth every other day. (Patient taking differently: Take 5 mg by mouth See admin instructions. Four times weekly) 45 tablet 3   No current facility-administered medications  on file prior to visit.     Allergies  Allergen Reactions  . Brilinta [Ticagrelor] Shortness Of Breath and Other (See Comments)    Caused chest pains  . Naprosyn [Naproxen] Hives  . Aspirin Hives, Diarrhea and Swelling    Benadryl stops reaction  . Lipitor [Atorvastatin] Other (See Comments)    Myalgias and leg heaviness   . Other Other (See Comments)    Very sensitive to all meds  . Gabapentin     dizziness    Assessment/Plan:  1. Hyperlipidemia - Will recheck baseline lipids today since pt has been taking rosuvastatin 5mg  every other day for the past 2 months. She has been experiencing muscle aching on this dose and wishes to stop therapy if possible. She is intolerant to 3 other statins as well. LDL goal < 70. Depending on lab results today and % lipid  lowering required, will plan to start either Zetia 10mg  daily or Praluent 75mg  Q2W injections.   Sarah Ray, PharmD, BCACP, [facility] [address] [place], [place] [address] Phone: [phone]; Fax: [phone] 06/15/2018 11:38 AM

## 2018-06-15 NOTE — Patient Instructions (Signed)
It was nice to meet you today  We will check your baseline cholesterol today  Your LDL goal is < 70  Depending on your results, we may start you on either Zetia (ezetimibe) 10mg  once a day pill or Praluent 75mg  injections twice a month  Call Megan, Pharmacist in the lipid clinic with any concerns [phone]

## 2018-06-16 MED ORDER — ALIROCUMAB 75 MG/ML ~~LOC~~ SOPN: 1.0000 "pen " | PEN_INJECTOR | SUBCUTANEOUS | 11 refills | Status: DC

## 2018-06-16 NOTE — Telephone Encounter (Signed)
PA approved. Rx sent to pharmacy to determine copay.

## 2018-06-16 NOTE — Addendum Note (Signed)
Addended by: Shany Marinez E on: 06/16/2018 08:08 AM   Modules accepted: Orders

## 2018-06-16 NOTE — Telephone Encounter (Addendum)
Copay cost prohibitive at $367 per month. Faxed over [employer] patient assistance application today. Pt is aware.

## 2018-06-19 ENCOUNTER — Ambulatory Visit (AMBULATORY_SURGERY_CENTER): Payer: PPO | Admitting: Gastroenterology

## 2018-06-19 ENCOUNTER — Encounter: Payer: Self-pay | Admitting: Gastroenterology

## 2018-06-19 VITALS — BP 120/79 | HR 63 | Temp 98.4°F | Resp 12 | Ht 60.0 in | Wt 129.0 lb

## 2018-06-19 DIAGNOSIS — K317 Polyp of stomach and duodenum: Secondary | ICD-10-CM

## 2018-06-19 DIAGNOSIS — I10 Essential (primary) hypertension: Secondary | ICD-10-CM | POA: Diagnosis not present

## 2018-06-19 DIAGNOSIS — K219 Gastro-esophageal reflux disease without esophagitis: Secondary | ICD-10-CM | POA: Diagnosis not present

## 2018-06-19 DIAGNOSIS — R202 Paresthesia of skin: Secondary | ICD-10-CM | POA: Diagnosis not present

## 2018-06-19 DIAGNOSIS — R52 Pain, unspecified: Secondary | ICD-10-CM | POA: Diagnosis not present

## 2018-06-19 DIAGNOSIS — I2510 Atherosclerotic heart disease of native coronary artery without angina pectoris: Secondary | ICD-10-CM | POA: Diagnosis not present

## 2018-06-19 DIAGNOSIS — R079 Chest pain, unspecified: Secondary | ICD-10-CM

## 2018-06-19 MED ORDER — SODIUM CHLORIDE 0.9 % IV SOLN: 500.0000 mL | Freq: Once | INTRAVENOUS | Status: DC

## 2018-06-19 NOTE — Progress Notes (Signed)
Called to room to assist during endoscopic procedure.  Patient ID and intended procedure confirmed with present staff. Received instructions for my participation in the procedure from the performing physician.  

## 2018-06-19 NOTE — Progress Notes (Signed)
Report to PACU, RN, vss, BBS= Clear.  

## 2018-06-19 NOTE — Progress Notes (Signed)
Pt's states no medical or surgical changes since previsit or office visit. 

## 2018-06-19 NOTE — Patient Instructions (Signed)
*   Handout given for hiatal hernia.  May resume Plavix tomorrow at prior dose.  YOU HAD AN ENDOSCOPIC PROCEDURE TODAY AT [facility]:   Refer to the procedure report that was given to you for any specific questions about what was found during the examination.  If the procedure report does not answer your questions, please call your gastroenterologist to clarify.  If you requested that your care partner not be given the details of your procedure findings, then the procedure report has been included in a sealed envelope for you to review at your convenience later.  YOU SHOULD EXPECT: Some feelings of bloating in the abdomen. Passage of more gas than usual.  Walking can help get rid of the air that was put into your GI tract during the procedure and reduce the bloating. If you had a lower endoscopy (such as a colonoscopy or flexible sigmoidoscopy) you may notice spotting of blood in your stool or on the toilet paper. If you underwent a bowel prep for your procedure, you may not have a normal bowel movement for a few days.  Please Note:  You might notice some irritation and congestion in your nose or some drainage.  This is from the oxygen used during your procedure.  There is no need for concern and it should clear up in a day or so.  SYMPTOMS TO REPORT IMMEDIATELY:   Following upper endoscopy (EGD)  Vomiting of blood or coffee ground material  New chest pain or pain under the shoulder blades  Painful or persistently difficult swallowing  New shortness of breath  Fever of 100F or higher  Black, tarry-looking stools  For urgent or emergent issues, a gastroenterologist can be reached at any hour by calling [phone].   DIET:  We do recommend a small meal at first, but then you may proceed to your regular diet.  Drink plenty of fluids but you should avoid alcoholic beverages for 24 hours.  ACTIVITY:  You should plan to take it easy for the rest of today and you should NOT  DRIVE or use heavy machinery until tomorrow (because of the sedation medicines used during the test).    FOLLOW UP: Our staff will call the number listed on your records the next business day following your procedure to check on you and address any questions or concerns that you may have regarding the information given to you following your procedure. If we do not reach you, we will leave a message.  However, if you are feeling well and you are not experiencing any problems, there is no need to return our call.  We will assume that you have returned to your regular daily activities without incident.  If any biopsies were taken you will be contacted by phone or by letter within the next 1-3 weeks.  Please call us at [phone] if you have not heard about the biopsies in 3 weeks.    SIGNATURES/CONFIDENTIALITY: You and/or your care partner have signed paperwork which will be entered into your electronic medical record.  These signatures attest to the fact that that the information above on your After Visit Summary has been reviewed and is understood.  Full responsibility of the confidentiality of this discharge information lies with you and/or your care-partner.

## 2018-06-19 NOTE — Op Note (Addendum)
[facility] Patient Name: Sarah Ray Procedure Date: 06/19/2018 2:19 PM MRN: [id number] Endoscopist: Ladene Artist , MD Age: 77 Referring MD:  Date of Birth: 30-Jul-1941 Gender: Female Account #: [id number] Procedure:                Upper GI endoscopy Indications:              Unexplained chest pain Medicines:                Monitored Anesthesia Care Procedure:                Pre-Anesthesia Assessment:                           - Prior to the procedure, a History and Physical                            was performed, and patient medications and                            allergies were reviewed. The patient's tolerance of                            previous anesthesia was also reviewed. The risks                            and benefits of the procedure and the sedation                            options and risks were discussed with the patient.                            All questions were answered, and informed consent                            was obtained. Prior Anticoagulants: The patient has                            taken Plavix (clopidogrel), last dose was 5 days                            prior to procedure. ASA Grade Assessment: III - A                            patient with severe systemic disease. After                            reviewing the risks and benefits, the patient was                            deemed in satisfactory condition to undergo the                            procedure.  After obtaining informed consent, the endoscope was                            passed under direct vision. Throughout the                            procedure, the patient's blood pressure, pulse, and                            oxygen saturations were monitored continuously. The                            Model GIF-[id number]) scope was introduced                            through the mouth, and advanced to the second part           of duodenum. The upper GI endoscopy was                            accomplished without difficulty. The patient                            tolerated the procedure well. Scope In: Scope Out: Findings:                 The examined esophagus was normal.                           A small hiatal hernia was present.                           Multiple 3 to 8 mm sessile polyps with no bleeding                            and no stigmata of recent bleeding were found in                            the gastric fundus and in the gastric body.                            Biopsies were taken with a cold forceps for                            histology.                           The exam of the stomach was otherwise normal.                           A single 3 mm angiodysplastic lesion without                            bleeding was found in the second portion of the  duodenum.                           The duodenal bulb was normal. Complications:            No immediate complications. Estimated Blood Loss:     Estimated blood loss was minimal. Impression:               - Normal esophagus.                           - Small hiatal hernia.                           - Multiple gastric polyps. Biopsied.                           - A single non-bleeding angiodysplastic lesion in                            the duodenum.                           - Normal duodenal bulb. Recommendation:           - Patient has a contact number available for                            emergencies. The signs and symptoms of potential                            delayed complications were discussed with the                            patient. Return to normal activities tomorrow.                            Written discharge instructions were provided to the                            patient.                           - Resume previous diet.                           - Continue present  medications.                           - Await pathology results.                           - No GI cause for chest pain found.                           - Resume Plavix (clopidogrel) at prior dose                            tomorrow. Refer to managing  physician for further                            adjustment of therapy. Ladene Artist, MD 06/19/2018 2:36:12 PM This report has been signed electronically.

## 2018-06-20 ENCOUNTER — Telehealth: Payer: Self-pay

## 2018-06-20 NOTE — Telephone Encounter (Signed)
  Follow up Call-  Call back number 06/19/2018  Post procedure Call Back phone  # [phone]  Permission to leave phone message Yes  Some recent data might be hidden     Patient questions:  Do you have a fever, pain , or abdominal swelling? No. Pain Score  0 *  Have you tolerated food without any problems? Yes.  Have you been able to return to your normal activities? Yes.    Do you have any questions about your discharge instructions: Diet   No. Medications  No. Follow up visit  No.  Do you have questions or concerns about your Care? No.  Actions: * If pain score is 4 or above: No action needed, pain <4.

## 2018-06-22 ENCOUNTER — Ambulatory Visit: Payer: PPO | Admitting: Gastroenterology

## 2018-06-27 NOTE — Progress Notes (Signed)
07/04/2018 Sarah Ray   1941/08/14  [id number]  Primary Physician Raina Mina., MD Primary Cardiologist: Dr Johnsie Cancel  HPI:  77 y/o female with a history of CAD, s/p MI-RCA PCI June 2015. Cath done Jan 2018 showed patent RCA sites and non obstructive residual CAD. She was  admitted 05/04/18 with chest pain felt to be non cardiac. Intolerant to multiple statins with LDL above goal. Seen by lipid clinic and tried on Praluent.. She has known GERD plavix held without problems for EGD done 06/19/18 small polyps, small hiatal hernia and small area of angiodysplasia in duodenum    Had teaching with Pharm D today regarding Praluent shots    Current Outpatient Medications  Medication Sig Dispense Refill  . Alirocumab (PRALUENT) 75 MG/ML SOPN Inject 1 pen into the skin every 14 (fourteen) days. 2 pen 11  . b complex vitamins tablet Take 1 tablet by mouth daily.    Marland Kitchen CALCIUM-VITAMIN D PO Take 1 tablet by mouth 2 (two) times daily.     . carvedilol (COREG) 3.125 MG tablet TAKE 1 TABLET(3.125 MG) BY MOUTH TWICE DAILY 180 tablet 3  . clopidogrel (PLAVIX) 75 MG tablet Take 75 mg by mouth daily.    . nitroGLYCERIN (NITROSTAT) 0.4 MG SL tablet Place 0.4 mg under the tongue every 5 (five) minutes as needed for chest pain.    . Omega-3 Fatty Acids (FISH OIL PO) Take 1 capsule by mouth daily.    . pantoprazole (PROTONIX) 40 MG tablet Take 1 tablet (40 mg total) by mouth daily. 30 tablet 1  . rosuvastatin (CRESTOR) 5 MG tablet Take 1 tablet (5 mg total) by mouth every other day. (Patient taking differently: Take 5 mg by mouth See admin instructions. Four times weekly) 45 tablet 3   No current facility-administered medications for this visit.     Allergies  Allergen Reactions  . Brilinta [Ticagrelor] Shortness Of Breath and Other (See Comments)    Caused chest pains  . Naprosyn [Naproxen] Hives  . Aspirin Hives, Diarrhea and Swelling    Benadryl stops reaction  . Lipitor [Atorvastatin] Other (See  Comments)    Myalgias and leg heaviness   . Other Other (See Comments)    Very sensitive to all meds  . Gabapentin     dizziness    Past Medical History:  Diagnosis Date  . Adenomatous polyps 07/1997  . Breast fibroadenoma   . CAD (coronary artery disease) 03/18/14   STEMI- stent to RCA and nonobstructive disease LAD and LCX  . External hemorrhoids   . GERD (gastroesophageal reflux disease)    LA Class Grade C Erosive esophagitis  . Hiatal hernia   . HTN (hypertension)   . Hyperlipidemia   . Internal hemorrhoids   . Osteopenia 04/2018   T score -1.9 FRAX 21% / 5%  . Peptic stricture of esophagus   . Skin cancer 2014   "forehead"  . 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 Marital status: Married    Spouse name: [name]  . Number of children: 2  . Years of education: Associates  . Highest education level: Not on file  Occupational History  . Occupation: Dance movement psychotherapist  Social Needs  . Financial resource strain: Not on file  . Food insecurity:    Worry: Not on file    Inability: Not on file  . Transportation needs:  Medical: Not on file    Non-medical: Not on file  Tobacco Use  . Smoking status: Never Smoker  . Smokeless tobacco: Never Used  Substance and Sexual Activity  . Alcohol use: No    Alcohol/week: 0.0 standard drinks  . Drug use: No  . Sexual activity: Not Currently    Birth control/protection: Surgical, Post-menopausal    Comment: HYST-1st intercourse 77 yo-Fewer than 5 partners  Lifestyle  . Physical activity:    Days per week: Not on file    Minutes per session: Not on file  . Stress: Not on file  Relationships  . Social connections:    Talks on phone: Not on file    Gets together: Not on file    Attends religious service: Not on file    Active member of club or organization: Not on file    Attends meetings of clubs or  organizations: Not on file    Relationship status: Not on file  . Intimate partner violence:    Fear of current or ex partner: Not on file    Emotionally abused: Not on file    Physically abused: Not on file    Forced sexual activity: Not on file  Other Topics Concern  . Not on file  Social History Narrative   Lives with husband   Daily caffeine:  1 cup per day (coffee or tea)     Family History  Problem Relation Age of Onset  . Colon polyps Mother   . Colon cancer Maternal Aunt   . Breast cancer Maternal Aunt        Age 96  . Breast cancer Daughter        Age 39  . Heart disease Brother        irregular heart beat     Review of Systems: General: negative for chills, fever, night sweats or weight changes.  Cardiovascular: negative for chest pain, dyspnea on exertion, edema, orthopnea, palpitations, paroxysmal nocturnal dyspnea or shortness of breath Dermatological: negative for rash Respiratory: negative for cough or wheezing Urologic: negative for hematuria Abdominal: negative for nausea, vomiting, diarrhea, bright red blood per rectum, melena, or hematemesis Neurologic: negative for visual changes, syncope, or dizziness All other systems reviewed and are otherwise negative except as noted above.    Blood pressure 128/74, pulse 60, height 5' (1.524 m), weight 127 lb (57.6 kg), SpO2 98 %.  Affect appropriate Healthy:  appears stated age 53: normal Neck supple with no adenopathy JVP normal no bruits no thyromegaly Lungs clear with no wheezing and good diaphragmatic motion Heart:  S1/S2 no murmur, no rub, gallop or click PMI normal Abdomen: benighn, BS positve, no tenderness, no AAA no bruit.  No HSM or HJR Distal pulses intact with no bruits No edema Neuro non-focal Skin warm and dry No muscular weakness    ASSESSMENT AND PLAN:   CAD:  Stable no angina PCI RCA 2015 patent by cath in 2018 continue medical Rx GERD: on protonix post EGD f/u [facility] HLD:  On  Praluent instructions and first shot given. F/U lipids in 8 weeks    Jenkins Rouge

## 2018-06-30 ENCOUNTER — Telehealth: Payer: Self-pay | Admitting: Pharmacist

## 2018-06-30 NOTE — Telephone Encounter (Signed)
Pt called to report that she was approved through [employer] program, but has not been contacted about getting medication. I advised she call them to set up shipment and she will do so today.  She does have an appt with Dr. Johnsie Cancel on Tuesday and she would like to bring her injection with her to do her first injection in the office that day. Advised that would be fine. Will need to order labs after 4th dose at that visit.

## 2018-07-03 ENCOUNTER — Encounter: Payer: Self-pay | Admitting: Gastroenterology

## 2018-07-04 ENCOUNTER — Ambulatory Visit: Payer: PPO | Admitting: Cardiovascular Disease

## 2018-07-04 ENCOUNTER — Encounter: Payer: Self-pay | Admitting: Cardiovascular Disease

## 2018-07-04 VITALS — BP 128/74 | HR 60 | Ht 60.0 in | Wt 127.0 lb

## 2018-07-04 DIAGNOSIS — I2510 Atherosclerotic heart disease of native coronary artery without angina pectoris: Secondary | ICD-10-CM | POA: Diagnosis not present

## 2018-07-04 DIAGNOSIS — E7800 Pure hypercholesterolemia, unspecified: Secondary | ICD-10-CM | POA: Diagnosis not present

## 2018-07-04 DIAGNOSIS — E785 Hyperlipidemia, unspecified: Secondary | ICD-10-CM | POA: Diagnosis not present

## 2018-07-04 NOTE — Telephone Encounter (Signed)
Pt counseled on Praluent injection technique in clinic today and she successfully self administered Praluent into her right outer thigh. Will schedule f/u lipid panel in 2 months to assess efficacy.

## 2018-07-04 NOTE — Patient Instructions (Signed)
Medication Instructions:   If you need a refill on your cardiac medications before your next appointment, please call your pharmacy.   Lab work: Your physician recommends that you return for lab work in: 2 month for lipid and liver panel.  If you have labs (blood work) drawn today and your tests are completely normal, you will receive your results only by: Marland Kitchen MyChart Message (if you have MyChart) OR . A paper copy in the mail If you have any lab test that is abnormal or we need to change your treatment, we will call you to review the results.  Testing/Procedures: NONE  Follow-Up: At [facility], you and your health needs are our priority.  As part of our continuing mission to provide you with exceptional heart care, we have created designated Provider Care Teams.  These Care Teams include your primary Cardiologist (physician) and Advanced Practice Providers (APPs -  Physician Assistants and Nurse Practitioners) who all work together to provide you with the care you need, when you need it. You will need a follow up appointment in 6 months.  Please call our office 2 months in advance to schedule this appointment.  You may see Jenkins Rouge, MD or one of the following Advanced Practice Providers on your designated Care Team:   Truitt Merle, NP Cecilie Kicks, NP . Kathyrn Drown, NP

## 2018-07-11 ENCOUNTER — Telehealth: Payer: Self-pay | Admitting: Pharmacist

## 2018-07-11 NOTE — Telephone Encounter (Signed)
Noted thank you

## 2018-07-11 NOTE — Telephone Encounter (Signed)
Pt states that she feels that she instantly had a reaction to the praluent medication. She describes a heaviness and "warmth' from knee down instantly after the injection. She states that since this time she has experienced tingling in elbows and states that the symptoms have gotten worse over the last few days and is bad today. She states the symptoms seem to be worse after dinner. She states this is similar to when she was taking statin medication (she stopped rosuvastatin last week).   Discussed options. Pt would like to delay next dose until 4 weeks and see how she tolerates at that time. She will give injection on 11/12. Will plan to call a few days later to check in, but advised that she call us with any issues/concerns if she has not heard from our office. Discussed that if symptoms reoccur could consider Repatha or clinical trial.   She requests that I route to [facility] as Juluis Rainier

## 2018-07-19 ENCOUNTER — Other Ambulatory Visit: Payer: Self-pay | Admitting: Cardiovascular Disease

## 2018-07-19 MED ORDER — NITROGLYCERIN 0.4 MG SL SUBL: 0.4000 mg | SUBLINGUAL_TABLET | SUBLINGUAL | 5 refills | Status: AC | PRN

## 2018-07-19 NOTE — Telephone Encounter (Signed)
Pt's medication was sent to pt's pharmacy as requested. Confirmation received.  °

## 2018-07-30 ENCOUNTER — Other Ambulatory Visit: Payer: Self-pay | Admitting: Gastroenterology

## 2018-08-02 ENCOUNTER — Telehealth: Payer: Self-pay | Admitting: Pharmacist

## 2018-08-02 NOTE — Telephone Encounter (Signed)
Called pt to follow up on Praluent injection after delaying dose.  She states that the reaction this time was much better and she is willing to remain on the medication for now. She would prefer we call in a few days to determine if she would like to continue every 4th week or try eery 2 weeks. Will follow up with her then to determine dose schedule.

## 2018-08-04 DIAGNOSIS — R5383 Other fatigue: Secondary | ICD-10-CM | POA: Diagnosis not present

## 2018-08-04 DIAGNOSIS — I700 Atherosclerosis of aorta: Secondary | ICD-10-CM | POA: Diagnosis not present

## 2018-08-04 DIAGNOSIS — Z79899 Other long term (current) drug therapy: Secondary | ICD-10-CM | POA: Diagnosis not present

## 2018-08-04 DIAGNOSIS — F411 Generalized anxiety disorder: Secondary | ICD-10-CM | POA: Diagnosis not present

## 2018-08-04 DIAGNOSIS — E559 Vitamin D deficiency, unspecified: Secondary | ICD-10-CM | POA: Diagnosis not present

## 2018-08-04 DIAGNOSIS — R5381 Other malaise: Secondary | ICD-10-CM | POA: Diagnosis not present

## 2018-08-04 DIAGNOSIS — E538 Deficiency of other specified B group vitamins: Secondary | ICD-10-CM | POA: Diagnosis not present

## 2018-08-04 DIAGNOSIS — I2510 Atherosclerotic heart disease of native coronary artery without angina pectoris: Secondary | ICD-10-CM | POA: Diagnosis not present

## 2018-08-04 DIAGNOSIS — E782 Mixed hyperlipidemia: Secondary | ICD-10-CM | POA: Diagnosis not present

## 2018-08-04 DIAGNOSIS — K219 Gastro-esophageal reflux disease without esophagitis: Secondary | ICD-10-CM | POA: Diagnosis not present

## 2018-08-04 DIAGNOSIS — I10 Essential (primary) hypertension: Secondary | ICD-10-CM | POA: Diagnosis not present

## 2018-08-07 ENCOUNTER — Telehealth: Payer: Self-pay | Admitting: Student-PharmD

## 2018-08-07 DIAGNOSIS — Z23 Encounter for immunization: Secondary | ICD-10-CM | POA: Diagnosis not present

## 2018-08-07 NOTE — Telephone Encounter (Signed)
Patient states that she would prefer to take Praluent every 4 weeks until she gets her lipid panel done in December. She states that she has felt burning and heaviness in her legs since her last dose of Praluent, but it resolves quickly and is generally manageable. She states that on Saturday (~1 week after most recent dose) the pain in her legs was more than it has been, but since it has resolved she feels much better. Will follow up with patient after receiving results of lipid panel in December.   Patient asks about blood pressure, states that there have been times that it has been elevated to 158/88 and 135/80s; advised patient to repeat blood pressure check after resting for 10-15 minutes. Patient visited her PCP (at [facility]) regarding her blood pressure and labs (BMET, T3, available in [place]) were drawn. Patient states that elevation in blood pressure may have been due to eating soup that was made with canned vegetables. Advised patient to rinse canned vegetables when she is cooking with them, if able.   Cleotis Lema, PharmD Student  08/07/2018

## 2018-08-08 NOTE — Telephone Encounter (Signed)
Agree with assessment and plan as documented.   While dosing Praluent every 4 weeks has not been extensively studied, I still would anticipate a beneficial LDL reduction and we are limited with other therapeutic options for LDL reduction. Once panel results will discuss need for increased dosing if warranted.

## 2018-08-28 DIAGNOSIS — H524 Presbyopia: Secondary | ICD-10-CM | POA: Diagnosis not present

## 2018-08-28 DIAGNOSIS — D3132 Benign neoplasm of left choroid: Secondary | ICD-10-CM | POA: Diagnosis not present

## 2018-08-28 DIAGNOSIS — H2513 Age-related nuclear cataract, bilateral: Secondary | ICD-10-CM | POA: Diagnosis not present

## 2018-09-04 ENCOUNTER — Other Ambulatory Visit: Payer: PPO | Admitting: *Deleted

## 2018-09-04 ENCOUNTER — Telehealth: Payer: Self-pay | Admitting: Pharmacist

## 2018-09-04 DIAGNOSIS — E7800 Pure hypercholesterolemia, unspecified: Secondary | ICD-10-CM | POA: Diagnosis not present

## 2018-09-04 LAB — LIPID PANEL
Chol/HDL Ratio: 3 ratio (ref 0.0–4.4)
Cholesterol, Total: 154 mg/dL (ref 100–199)
HDL: 51 mg/dL (ref >39)
LDL CALC: 87 mg/dL (ref 0–99)
Triglycerides: 79 mg/dL (ref 0–149)
VLDL CHOLESTEROL CAL: 16 mg/dL (ref 5–40)

## 2018-09-04 LAB — HEPATIC FUNCTION PANEL
ALK PHOS: 78 IU/L (ref 39–117)
ALT: 12 IU/L (ref 0–32)
AST: 15 IU/L (ref 0–40)
Albumin: 4.3 g/dL (ref 3.5–4.8)
BILIRUBIN, DIRECT: 0.12 mg/dL (ref 0.00–0.40)
Bilirubin Total: 0.4 mg/dL (ref 0.0–1.2)
TOTAL PROTEIN: 6.3 g/dL (ref 6.0–8.5)

## 2018-09-04 NOTE — Telephone Encounter (Signed)
Pt LM that took shot and had reaction, but feels better today. She is scheduled for labs today.   Returned call to patient and LM.

## 2018-09-04 NOTE — Telephone Encounter (Signed)
Spoke with patient who states that she is still having weakness in her legs. She states that is also has leg cramps in the evening.   She states that she also has some burning on her back and down her arms. She states that she has been worked up for this and no clear etiology. She states that this is the same as when she was on statins. It gets worse the few days after an injection and better as she gets further from injection.   Discussed that will await results from cholesterol panel from today and then discuss options. Potentially consider bempedoic acid next year.  Will call once results are back.

## 2018-09-05 NOTE — Telephone Encounter (Signed)
LMOM to discuss results of lipid panel and options for cholesterol treatment moving forward as per our discussion yesterday.

## 2018-09-05 NOTE — Telephone Encounter (Signed)
Spoke with patient and she would like to wait a few more weeks to make a decision about whether to change or continue on therapy. We discussed that levels may be higher in the weeks that the medication may not be in her system. She will give [place] a call in January to discuss options again.

## 2018-09-12 DIAGNOSIS — E782 Mixed hyperlipidemia: Secondary | ICD-10-CM | POA: Diagnosis not present

## 2018-09-12 DIAGNOSIS — J0100 Acute maxillary sinusitis, unspecified: Secondary | ICD-10-CM | POA: Diagnosis not present

## 2018-10-05 MED ORDER — EZETIMIBE 10 MG PO TABS: 10.0000 mg | ORAL_TABLET | Freq: Every day | ORAL | 11 refills | Status: DC

## 2018-10-05 NOTE — Addendum Note (Signed)
Addended by: SUPPLE, MEGAN E on: 10/05/2018 02:06 PM   Modules accepted: Orders

## 2018-10-05 NOTE — Addendum Note (Signed)
Addended by: SUPPLE, MEGAN E on: 10/05/2018 02:09 PM   Modules accepted: Orders

## 2018-10-05 NOTE — Telephone Encounter (Addendum)
Pt called clinic - reports she took a Praluent shot on 1/7 and has felt burning on her back and arms, heaviness in her legs, and flu like symptoms. Will stop Praluent shots. Discussed ORION trial vs waiting for bempedoic acid to come to market. She wishes to wait for bempedoic acid. Will call pt once this is FDA approved. Will also start Zetia 10mg  in the mean time and will plan to switch to bempedoic acid-Zetia combo pill once available.

## 2018-10-26 ENCOUNTER — Telehealth: Payer: Self-pay | Admitting: Pharmacist

## 2018-10-26 NOTE — Telephone Encounter (Signed)
Pt called today to report that she is just now starting to feel better off Praluent and she would like to defer initiation of Zetia for another week or two until she feels completely better. Advised this would be fine and to let us know if she experiences issues.   She also wonders if another medication could be causing her to feel so poorly (specifically plavix or carvedilol). Advised that while rare occasionally carvedilol may be associated with symptoms. She will  continue on therapy for now and call if symptoms continue so that we can consider alternate therapy (such as metoprolol). She agrees with this plan.

## 2018-11-13 ENCOUNTER — Telehealth: Payer: Self-pay | Admitting: Pharmacist

## 2018-11-13 NOTE — Telephone Encounter (Signed)
Pt called to report that she still has some burning in chest and throat and back. She states that this burning is not worsened by exercise, but feels like she has been standing near a fire and her skin is hot. She states that her skin is not actually hot, but that's the sensation she feels. She has been told that she does need to take a medication for stomach acid for life. Advised that fish oil can be associated with stomach acid and rarely muscle aches. She will stop this and start the Zetia next week. She agrees with plan.

## 2018-11-17 ENCOUNTER — Encounter: Payer: Self-pay | Admitting: Gastroenterology

## 2018-11-23 ENCOUNTER — Telehealth: Payer: Self-pay | Admitting: Pharmacist

## 2018-11-23 NOTE — Telephone Encounter (Signed)
Spoke with patient regarding Zetia. She still has some pain, but is improving. Advised that she start Zetia. Discussed change in carvedilol, but will defer this for now. She will call back in a few weeks to report symptoms and schedule follow up labs.

## 2018-12-05 ENCOUNTER — Telehealth: Payer: Self-pay | Admitting: Pharmacist

## 2018-12-05 MED ORDER — METOPROLOL SUCCINATE ER 25 MG PO TB24: 25.0000 mg | ORAL_TABLET | Freq: Every day | ORAL | 3 refills | Status: DC

## 2018-12-05 NOTE — Telephone Encounter (Signed)
Spoke with patient. She reports that she is not doing well.   She states that she since starting zetia (on 3/5) she stopped it on 3/12. She has had numb legs increasingly on ezetimibe. She that this has been mildly numb since her second cath. She states that she is starting to feel slightly better off the ezetimibe but not much.   She also thinks this may be related to carvedilol. Will stop and try metoprolol succinate 25mg  daily. She will call with any issues with this medication.

## 2018-12-20 ENCOUNTER — Telehealth: Payer: Self-pay | Admitting: Pharmacist

## 2018-12-20 MED ORDER — EZETIMIBE 10 MG PO TABS: 5.0000 mg | ORAL_TABLET | Freq: Every day | ORAL | 3 refills | Status: DC

## 2018-12-20 NOTE — Telephone Encounter (Signed)
Pt states that her blood pressure increased dramatically when stopping carvedilol and starting metoprolol, but since has leveled off. She states that it now runs consistently 120s-low 130s/70s. She does feel that her aches are better than before. She will continue for now and call with any issues.   She also states that she would like to get back on something for her cholesterol. She will resume ezetimibe 5mg  to see if she can tolerate this lower dose.

## 2018-12-25 ENCOUNTER — Telehealth: Payer: Self-pay

## 2018-12-25 NOTE — Telephone Encounter (Signed)
Virtual Visit Pre-Appointment Phone Call  Steps For Call:  1. Confirm consent - "In the setting of the current Covid19 crisis, you are scheduled for a (phone or video) visit with your provider on (date) at (time).  Just as we do with many in-office visits, in order for you to participate in this visit, we must obtain consent.  If you'd like, I can send this to your mychart (if signed up) or email for you to review.  Otherwise, I can obtain your verbal consent now.  All virtual visits are billed to your insurance company just like a normal visit would be.  By agreeing to a virtual visit, we'd like you to understand that the technology does not allow for your provider to perform an examination, and thus may limit your provider's ability to fully assess your condition.  Finally, though the technology is pretty good, we cannot assure that it will always work on either your or our end, and in the setting of a video visit, we may have to convert it to a phone-only visit.  In either situation, we cannot ensure that we have a secure connection.  Are you willing to proceed?"  2. Give patient instructions for WebEx download to smartphone as below if video visit  3. Advise patient to be prepared with any vital sign or heart rhythm information, their current medicines, and a piece of paper and pen handy for any instructions they may receive the day of their visit  4. Inform patient they will receive a phone call 15 minutes prior to their appointment time (may be from unknown caller ID) so they should be prepared to answer  5. Confirm that appointment type is correct in Epic appointment notes (video vs telephone)    TELEPHONE CALL NOTE  Sarah Ray has been deemed a candidate for a follow-up tele-health visit to limit community exposure during the Covid-19 pandemic. I spoke with the patient via phone to ensure availability of phone/video source, confirm preferred email & phone number, and discuss  instructions and expectations.  I reminded Sarah Ray to be prepared with any vital sign and/or heart rhythm information that could potentially be obtained via home monitoring, at the time of her visit. I reminded Sarah Ray to expect a phone call at the time of her visit if her visit.  Did the patient verbally acknowledge consent to treatment? Yes  Sarah Barter, RN 12/25/2018 10:39 AM   DOWNLOADING THE [place]  - If Apple, go to [employer] and type in WebEx in the search bar. [place] [employer], the blue/green circle. The app is free but as with any other app downloads, their phone may require them to verify saved payment information or Apple password. The patient does NOT have to create an account.  - If Android, ask patient to go to [employer] and type in WebEx in the search bar. [place] [employer], the blue/green circle. The app is free but as with any other app downloads, their phone may require them to verify saved payment information or Android password. The patient does NOT have to create an account.   CONSENT FOR TELE-HEALTH VISIT - PLEASE REVIEW  I hereby voluntarily request, consent and authorize [facility] and its employed or contracted physicians, physician assistants, nurse practitioners or other licensed health care professionals (the Practitioner), to provide me with telemedicine health care services (the "Services") as deemed necessary by the treating Practitioner.  I acknowledge and consent to receive the Services by the Practitioner via telemedicine. I understand that the telemedicine visit will involve communicating with the Practitioner through live audiovisual communication technology and the disclosure of certain medical information by electronic transmission. I acknowledge that I have been given the opportunity to request an in-person assessment or other available alternative prior to the telemedicine visit and am  voluntarily participating in the telemedicine visit.  I understand that I have the right to withhold or withdraw my consent to the use of telemedicine in the course of my care at any time, without affecting my right to future care or treatment, and that the Practitioner or I may terminate the telemedicine visit at any time. I understand that I have the right to inspect all information obtained and/or recorded in the course of the telemedicine visit and may receive copies of available information for a reasonable fee.  I understand that some of the potential risks of receiving the Services via telemedicine include:  Marland Kitchen Delay or interruption in medical evaluation due to technological equipment failure or disruption; . Information transmitted may not be sufficient (e.g. poor resolution of images) to allow for appropriate medical decision making by the Practitioner; and/or  . In rare instances, security protocols could fail, causing a breach of personal health information.  Furthermore, I acknowledge that it is my responsibility to provide information about my medical history, conditions and care that is complete and accurate to the best of my ability. I acknowledge that Practitioner's advice, recommendations, and/or decision may be based on factors not within their control, such as incomplete or inaccurate data provided by me or distortions of diagnostic images or specimens that may result from electronic transmissions. I understand that the practice of medicine is not an exact science and that Practitioner makes no warranties or guarantees regarding treatment outcomes. I acknowledge that I will receive a copy of this consent concurrently upon execution via email to the email address I last provided but may also request a printed copy by calling the office of [place].    I understand that my insurance will be billed for this visit.   I have read or had this consent read to me. . I understand the  contents of this consent, which adequately explains the benefits and risks of the Services being provided via telemedicine.  . I have been provided ample opportunity to ask questions regarding this consent and the Services and have had my questions answered to my satisfaction. . I give my informed consent for the services to be provided through the use of telemedicine in my medical care  By participating in this telemedicine visit I agree to the above.

## 2018-12-28 ENCOUNTER — Telehealth: Payer: Self-pay | Admitting: Pharmacist

## 2018-12-28 NOTE — Telephone Encounter (Signed)
Pt left message. Returned call and she states that she is having burning in legs and back as well as numbness in her legs. She states that she has been stumbling from the pain. She stopped the ezetimibe on Monday. She will remain off ezetimibe therapy. We discussed Nexletol and will consider adding this in a few weeks if her symptoms are improved. Will plan to call her at that time to discuss.

## 2018-12-28 NOTE — Progress Notes (Signed)
Virtual Visit via Video Note   This visit type was conducted due to national recommendations for restrictions regarding the COVID-19 Pandemic (e.g. social distancing) in an effort to limit this patient's exposure and mitigate transmission in our community.  Due to her co-morbid illnesses, this patient is at least at moderate risk for complications without adequate follow up.  This format is felt to be most appropriate for this patient at this time.  All issues noted in this document were discussed and addressed.  A limited physical exam was performed with this format.  Please refer to the patient's chart for her consent to telehealth for [facility].   Evaluation Performed:  Follow-up visit  Date:  12/28/2018   ID:  Sarah Ray, DOB Aug 01, 1941, MRN [id number]  Patient Location: Home  Provider Location: Office  PCP:  Raina Mina., MD  Cardiologist:  Jenkins Rouge, MD   Electrophysiologist:  None   Chief Complaint:  CAD/HLD   History of Present Illness:    78 y/o female with a history of CAD, s/p MI-RCA PCI June 2015. Cath done Jan 2018 showed patent RCA sites and non obstructive residual CAD. She was  admitted 05/04/18 with chest pain felt to be non cardiac. Intolerant to multiple statins with LDL above goal. Seen by lipid clinic and tried on Praluent.. This was stopped due to dizziness and flu like symptoms She has known GERD plavix held without problems for EGD done 06/19/18 small polyps, small hiatal hernia and small area of angiodysplasia in duodenum    Having paresthesias in legs coreg changed to lopressor and zetia held Also had burning feeling with praluent LDL was 87 09/04/18   She has really failed all attempts at meds for her LDL She has been off everything for a few weeks now and feels great. Will check labs in 6 weeks If LDL 100 or less will not try bempedoic acid likely too expensive anyway  The patient does not have symptoms concerning for COVID-19 infection (fever,  chills, cough, or new shortness of breath).    Past Medical History:  Diagnosis Date  . Adenomatous polyps 07/1997  . Breast fibroadenoma   . CAD (coronary artery disease) 03/18/14   STEMI- stent to RCA and nonobstructive disease LAD and LCX  . External hemorrhoids   . GERD (gastroesophageal reflux disease)    LA Class Grade C Erosive esophagitis  . Hiatal hernia   . HTN (hypertension)   . Hyperlipidemia   . Internal hemorrhoids   . Osteopenia 04/2018   T score -1.9 FRAX 21% / 5%  . Peptic stricture of esophagus   . Skin cancer 2014   "forehead"  . ST elevation myocardial infarction (STEMI) involving right coronary artery in recovery phase ([place]) 03/18/14   Past Surgical History:  Procedure Laterality Date  . ABDOMINAL HYSTERECTOMY  1977   Leiomyoma  . BREAST CYST ASPIRATION Right ~ 2011  . BUNIONECTOMY Bilateral 04/04/2013  . CARDIAC CATHETERIZATION N/A 09/29/2016   Procedure: Left Heart Cath and Coronary Angiography;  Surgeon: Belva Crome, MD;  Location: [facility];  Service: Cardiovascular;  Laterality: N/A;  . CORONARY ANGIOPLASTY WITH STENT PLACEMENT  03/18/14   Xience DES to totally occl RCA, residual non obstructive disease to LAD and LCX  . ESOPHAGOGASTRODUODENOSCOPY (EGD) WITH ESOPHAGEAL DILATION  2000's X 1  . LEFT HEART CATH N/A 03/18/2014   Procedure: LEFT HEART CATH;  Surgeon: Blane Ohara, MD;  Location: [facility] CATH [facility];  Service:  Cardiovascular;  Laterality: N/A;  . LEFT HEART CATHETERIZATION WITH CORONARY ANGIOGRAM N/A 04/22/2014   Procedure: LEFT HEART CATHETERIZATION WITH CORONARY ANGIOGRAM;  Surgeon: Blane Ohara, MD;  Location: [facility] CATH LAB;  Service: Cardiovascular;  Laterality: N/A;  . SKIN CANCER EXCISION  2014  . THYROID CYST EXCISION  1970's  . TONSILLECTOMY AND ADENOIDECTOMY  ~ 1950     No outpatient medications have been marked as taking for the 01/01/19 encounter (Appointment) with [name], MD.     Allergies:   Brilinta  [ticagrelor]; Naprosyn [naproxen]; Aspirin; Lipitor [atorvastatin]; Other; Gabapentin; and Praluent [alirocumab]   Social History   Tobacco Use  . Smoking status: Never Smoker  . Smokeless tobacco: Never Used  Substance Use Topics  . Alcohol use: No    Alcohol/week: 0.0 standard drinks  . Drug use: No     Family Hx: The patient's family history includes Breast cancer in her daughter and maternal aunt; Colon cancer in her maternal aunt; Colon polyps in her mother; Heart disease in her brother.  ROS:   Please see the history of present illness.     All other systems reviewed and are negative.   Prior CV studies:   The following studies were reviewed today:  Echo 05/04/18 EF normal trivial AR Cath 09/29/16  40-50% distal RCA 40-50% proximal LAD 50-70% D2 and 40% mid circumflex EF normal    Labs/Other Tests and Data Reviewed:    EKG: 05/23/18 SR rate 68 normal    Recent Labs: 05/04/2018: B Natriuretic Peptide 113.6; BUN 13; Creatinine, Ser 0.88; Hemoglobin 12.9; Platelets 198; Potassium 3.7; Sodium 140 09/04/2018: ALT 12   Recent Lipid Panel Lab Results  Component Value Date/Time   CHOL 154 09/04/2018 09:23 AM   TRIG 79 09/04/2018 09:23 AM   HDL 51 09/04/2018 09:23 AM   CHOLHDL 3.0 09/04/2018 09:23 AM   CHOLHDL 2.8 06/17/2016 08:48 AM   LDLCALC 87 09/04/2018 09:23 AM   LDLDIRECT 112 (H) 06/15/2018 11:39 AM    Wt Readings from Last 3 Encounters:  07/04/18 57.6 kg  06/19/18 58.5 kg  05/23/18 58.9 kg     Objective:    Vital Signs:  There were no vitals taken for this visit.   Well nourished, well developed female in no acute distress. Skin warm and dry No JVP elevation No tachypnea No edema  ASSESSMENT & PLAN:    1. CAD:  Moderate by cath 09/29/16 no angina observe 2. HLD:  Intolerant to statins, praluent and zetia check labs 6 weeks  3. GERD:  On protonix f/u primary  COVID-19 Education: The signs and symptoms of COVID-19 were discussed with the patient and  how to seek care for testing (follow up with PCP or arrange E-visit).  The importance of social distancing was discussed today.  Time:   Today, I have spent 30 minutes with the patient with telehealth technology discussing the above problems.     Medication Adjustments/Labs and Tests Ordered: Current medicines are reviewed at length with the patient today.  Concerns regarding medicines are outlined above.  Tests Ordered: No orders of the defined types were placed in this encounter.  Medication Changes: No orders of the defined types were placed in this encounter.   Disposition:  Follow up in a year  Signed, Jenkins Rouge, MD  12/28/2018 5:58 PM    [place]

## 2019-01-01 ENCOUNTER — Encounter: Payer: Self-pay | Admitting: Cardiovascular Disease

## 2019-01-01 ENCOUNTER — Other Ambulatory Visit: Payer: Self-pay

## 2019-01-01 ENCOUNTER — Telehealth (INDEPENDENT_AMBULATORY_CARE_PROVIDER_SITE_OTHER): Payer: PPO | Admitting: Cardiovascular Disease

## 2019-01-01 VITALS — BP 126/78 | HR 59 | Ht 61.0 in | Wt 118.0 lb

## 2019-01-01 DIAGNOSIS — E782 Mixed hyperlipidemia: Secondary | ICD-10-CM

## 2019-01-01 NOTE — Patient Instructions (Addendum)
Medication Instructions:   If you need a refill on your cardiac medications before your next appointment, please call your pharmacy.   Lab work: Your physician recommends that you return for lab work in: 6 weeks for fasting lipid and liver panel.  If you have labs (blood work) drawn today and your tests are completely normal, you will receive your results only by: Marland Kitchen MyChart Message (if you have MyChart) OR . A paper copy in the mail If you have any lab test that is abnormal or we need to change your treatment, we will call you to review the results.  Testing/Procedures: None ordered today.  Follow-Up: At [facility], you and your health needs are our priority.  As part of our continuing mission to provide you with exceptional heart care, we have created designated Provider Care Teams.  These Care Teams include your primary Cardiologist (physician) and Advanced Practice Providers (APPs -  Physician Assistants and Nurse Practitioners) who all work together to provide you with the care you need, when you need it. You will need a follow up appointment in 6 months.  Please call our office 2 months in advance to schedule this appointment.  You may see Jenkins Rouge, MD or one of the following Advanced Practice Providers on your designated Care Team:   Truitt Merle, NP Cecilie Kicks, NP . Kathyrn Drown, NP

## 2019-01-17 ENCOUNTER — Telehealth: Payer: Self-pay | Admitting: Pharmacist

## 2019-01-17 NOTE — Telephone Encounter (Signed)
She states that she feels better off the cholesterol medication, but thinks the blood pressure may be making her weak. She states her blood pressure is running 110s/70s. She states that she is eating better still and she has lost some weight. She has been eating very healthy. Discussed that weakness could be her body adjusting back to her blood pressure running closer to her normal (since she ran higher for sometime). She restarted her B vitamins (she had discontinued to see if this was causing her to feel poorly). Advised to continue current regimen and monitor if still feeling poorly in several weeks, call the office. She states understanding.

## 2019-02-02 DIAGNOSIS — E782 Mixed hyperlipidemia: Secondary | ICD-10-CM | POA: Diagnosis not present

## 2019-02-02 DIAGNOSIS — I700 Atherosclerosis of aorta: Secondary | ICD-10-CM | POA: Diagnosis not present

## 2019-02-02 DIAGNOSIS — E538 Deficiency of other specified B group vitamins: Secondary | ICD-10-CM | POA: Diagnosis not present

## 2019-02-02 DIAGNOSIS — Z789 Other specified health status: Secondary | ICD-10-CM | POA: Diagnosis not present

## 2019-02-02 DIAGNOSIS — Z79899 Other long term (current) drug therapy: Secondary | ICD-10-CM | POA: Diagnosis not present

## 2019-02-02 DIAGNOSIS — F411 Generalized anxiety disorder: Secondary | ICD-10-CM | POA: Diagnosis not present

## 2019-02-02 DIAGNOSIS — I10 Essential (primary) hypertension: Secondary | ICD-10-CM | POA: Diagnosis not present

## 2019-02-02 DIAGNOSIS — R5381 Other malaise: Secondary | ICD-10-CM | POA: Diagnosis not present

## 2019-02-02 DIAGNOSIS — I2510 Atherosclerotic heart disease of native coronary artery without angina pectoris: Secondary | ICD-10-CM | POA: Diagnosis not present

## 2019-02-02 DIAGNOSIS — R5383 Other fatigue: Secondary | ICD-10-CM | POA: Diagnosis not present

## 2019-02-02 DIAGNOSIS — D7589 Other specified diseases of blood and blood-forming organs: Secondary | ICD-10-CM | POA: Diagnosis not present

## 2019-02-02 DIAGNOSIS — K219 Gastro-esophageal reflux disease without esophagitis: Secondary | ICD-10-CM | POA: Diagnosis not present

## 2019-02-02 DIAGNOSIS — M5030 Other cervical disc degeneration, unspecified cervical region: Secondary | ICD-10-CM | POA: Diagnosis not present

## 2019-02-13 ENCOUNTER — Telehealth: Payer: Self-pay | Admitting: Pharmacist

## 2019-02-13 MED ORDER — METOPROLOL SUCCINATE ER 25 MG PO TB24: 12.5000 mg | ORAL_TABLET | Freq: Every day | ORAL | 3 refills | Status: DC

## 2019-02-13 NOTE — Telephone Encounter (Signed)
Pt called to report that BP has been low 99-110/60s HR 54-62.   She states that she has been dizzy and having "skin deep pains." She states that the pain seems to be more in in the skin than the muscles. She notices the dizziness more upon standing and has had to be very careful when standing.   Will decrease metoprolol to 12.5mg  daily.   She also states that she did have her blood work done with PCP yesterday (in [employer]). I have cancelled her appt with our lab for tomorrow. She requests that I route to Dr. Johnsie Cancel for input. Of note LDL has increased to 130mg /dL from 87mg /dL last year - now off all cholesterol therapy.

## 2019-02-14 ENCOUNTER — Other Ambulatory Visit: Payer: PPO

## 2019-02-14 ENCOUNTER — Other Ambulatory Visit: Payer: Self-pay | Admitting: Cardiovascular Disease

## 2019-02-14 NOTE — Telephone Encounter (Signed)
Called patient with Dr. Kyla Balzarine recommendations.

## 2019-02-14 NOTE — Telephone Encounter (Signed)
She is intolerant to cholesterol meds and does not have severe CAD ok to lower beta blocker

## 2019-02-16 ENCOUNTER — Encounter: Payer: Self-pay | Admitting: Gastroenterology

## 2019-02-16 ENCOUNTER — Other Ambulatory Visit: Payer: Self-pay

## 2019-02-16 ENCOUNTER — Ambulatory Visit (INDEPENDENT_AMBULATORY_CARE_PROVIDER_SITE_OTHER): Payer: PPO | Admitting: Gastroenterology

## 2019-02-16 VITALS — Ht 60.0 in | Wt 118.0 lb

## 2019-02-16 DIAGNOSIS — R079 Chest pain, unspecified: Secondary | ICD-10-CM | POA: Diagnosis not present

## 2019-02-16 DIAGNOSIS — Z7902 Long term (current) use of antithrombotics/antiplatelets: Secondary | ICD-10-CM | POA: Diagnosis not present

## 2019-02-16 DIAGNOSIS — Z86010 Personal history of colonic polyps: Secondary | ICD-10-CM

## 2019-02-16 DIAGNOSIS — K219 Gastro-esophageal reflux disease without esophagitis: Secondary | ICD-10-CM

## 2019-02-16 MED ORDER — PANTOPRAZOLE SODIUM 40 MG PO TBEC: 40.0000 mg | DELAYED_RELEASE_TABLET | Freq: Two times a day (BID) | ORAL | 2 refills | Status: DC

## 2019-02-16 NOTE — Patient Instructions (Signed)
You have been scheduled for an abdominal ultrasound at [facility] imaging on 02/23/19 at 830am [address]. Please arrive 15 minutes prior to your appointment for registration. Make certain not to have anything to eat or drink 6 hours prior to your appointment. Should you need to reschedule your appointment, please contact radiology at [phone]. This test typically takes about 30 minutes to perform.  We have sent the following medications to your pharmacy for you to pick up at your convenience: Protonix 40 mg twice daily  Thank you for entrusting me with your care and choosing [facility].  Dr Fuller Plan

## 2019-02-16 NOTE — Progress Notes (Signed)
    History of Present Illness: This is a 78 year old female referred by Raina Mina., MD for the evaluation of chest pain.  She relates frequent chest pain for the past few months.  She feels well in the morning but by afternoon she has frequent burning chest pain. Occasionally the pain is located in her right chest and radiates to her back and occasionally she has notes some pain in her epigastrium. Often the pain responds to Tums. Denies weight loss, constipation, diarrhea, change in stool caliber, melena, hematochezia, nausea, vomiting, dysphagia.   EGD 05/2018 - Normal esophagus. - Small hiatal hernia. - Multiple gastric polyps. Biopsied. (fundic gland polyps) - A single non-bleeding angiodysplastic lesion in the duodenum. - Normal duodenal bulb.  Colon 09/2013 Normal  Current Medications, Allergies, Past Medical History, Past Surgical History, Family History and Social History were reviewed in [employer] record.   Physical Exam: Telemedicine - not performed    Assessment and Recommendations:  1. Personal history of adenomatous colon polyps.  She is due for surveillance colonoscopy however she states she would like to call back and schedule this in a month or so.  We reviewed in detail current COVID-19 related measures have implemented in our office and [facility]. The risks (including bleeding, perforation, infection, missed lesions, medication reactions and possible hospitalization or surgery if complications occur), benefits, and alternatives to colonoscopy with possible biopsy and possible polypectomy were discussed with the patient and they consent to proceed.   2. Chest pain and GERD.  Not clear that all her chest pain symptoms are due to GERD however a component of her symptoms are reflux related.  Schedule RUQ ultrasound. Increase Protonix to 40 mg p.o. twice daily for 2 months and if symptoms are well controlled she may need to remain on this regimen.  TUMS  prn.  Closely follow antireflux measures.  3. CAD with stents on Plavix. Hold Plavix 5 days before procedure - will instruct when and how to resume after procedure. Low but real risk of cardiovascular event such as heart attack, stroke, embolism, thrombosis or ischemia/infarct of other organs off Plavix explained and need to seek urgent help if this occurs. The patient consents to proceed. Will communicate by phone or EMR with patient's prescribing provider to confirm that holding Plavix is reasonable in this case.     These services were provided via telemedicine, audio only per patient.  The patient was at home and the provider was in the office, alone.  We discussed the limitations of evaluation and management by telemedicine and the availability of in person appointments.  Patient consented for this telemedicine visit and is aware of possible charges for this service.  The other person participating in the telemedicine service was an LPN who reviewed medications, allergies, past history and completed AVS.  Time spent on call: 13 minutes    cc: Raina Mina., MD [address] [place], [place] [address]

## 2019-02-23 ENCOUNTER — Other Ambulatory Visit: Payer: Self-pay

## 2019-02-23 ENCOUNTER — Ambulatory Visit
Admission: RE | Admit: 2019-02-23 | Discharge: 2019-02-23 | Disposition: A | Discharge status: Home / Self Care | Payer: PPO | Source: Ambulatory Visit | Attending: Gastroenterology | Admitting: Gastroenterology

## 2019-02-23 DIAGNOSIS — K219 Gastro-esophageal reflux disease without esophagitis: Secondary | ICD-10-CM

## 2019-02-23 DIAGNOSIS — R079 Chest pain, unspecified: Secondary | ICD-10-CM

## 2019-02-23 DIAGNOSIS — K7689 Other specified diseases of liver: Secondary | ICD-10-CM | POA: Diagnosis not present

## 2019-03-01 DIAGNOSIS — H6123 Impacted cerumen, bilateral: Secondary | ICD-10-CM | POA: Diagnosis not present

## 2019-03-13 DIAGNOSIS — L814 Other melanin hyperpigmentation: Secondary | ICD-10-CM | POA: Diagnosis not present

## 2019-03-13 DIAGNOSIS — L820 Inflamed seborrheic keratosis: Secondary | ICD-10-CM | POA: Diagnosis not present

## 2019-03-13 DIAGNOSIS — D2272 Melanocytic nevi of left lower limb, including hip: Secondary | ICD-10-CM | POA: Diagnosis not present

## 2019-03-13 DIAGNOSIS — Z85828 Personal history of other malignant neoplasm of skin: Secondary | ICD-10-CM | POA: Diagnosis not present

## 2019-03-13 DIAGNOSIS — L821 Other seborrheic keratosis: Secondary | ICD-10-CM | POA: Diagnosis not present

## 2019-03-13 DIAGNOSIS — L720 Epidermal cyst: Secondary | ICD-10-CM | POA: Diagnosis not present

## 2019-03-13 DIAGNOSIS — D225 Melanocytic nevi of trunk: Secondary | ICD-10-CM | POA: Diagnosis not present

## 2019-03-16 ENCOUNTER — Other Ambulatory Visit: Payer: Self-pay

## 2019-03-19 ENCOUNTER — Encounter: Payer: Self-pay | Admitting: Gynecology

## 2019-03-19 ENCOUNTER — Ambulatory Visit (INDEPENDENT_AMBULATORY_CARE_PROVIDER_SITE_OTHER): Payer: PPO | Admitting: Gynecology

## 2019-03-19 ENCOUNTER — Ambulatory Visit: Payer: PPO | Admitting: Gynecology

## 2019-03-19 ENCOUNTER — Other Ambulatory Visit: Payer: Self-pay

## 2019-03-19 VITALS — BP 124/80

## 2019-03-19 DIAGNOSIS — N8189 Other female genital prolapse: Secondary | ICD-10-CM | POA: Diagnosis not present

## 2019-03-19 NOTE — Progress Notes (Signed)
    Sarah Ray [birth year] [id number]        77 y.o.  G2P2002 presents having been doing a lot of yard work with heavy lifting noticing increased pelvic pressure.  Not really pain but just more of a pressure feeling.  She has a history of pelvic relaxation with cystocele/cuff/enterocele component.  No urinary symptoms such as frequency dysuria urgency.  No GI symptoms such as constipation.  Past medical history,surgical history, problem list, medications, allergies, family history and social history were all reviewed and documented in the EPIC chart.  Directed ROS with pertinent positives and negatives documented in the history of present illness/assessment and plan.  Exam: Caryn Bee assistant Vitals:   03/19/19 0944  BP: 124/80   General appearance:  Normal Abdomen soft nontender without masses guarding rebound Pelvic external BUS vagina with atrophic changes.  Vaginal prolapse to within 2 fingerbreadths of the introitus.  Bimanual without masses or tenderness.  Rectal exam without significant rectocele.  Assessment/Plan:  78 y.o. [id number] with pelvic pressure symptoms after heavy lifting.  History of pelvic relaxation.  Exam is overall stable from her prior exams.  I reassured the patient that it does not seem like her prolapse is worsening.  Will check urine analysis for completeness but no other associated symptoms.  Discussed options to include observation if she is not overly bothered by the symptoms, trial of pessary and surgery.  We discussed the pros and cons of each choice.  She is not interested in surgery.  She may want to try a pessary but not at this time.  She would prefer to watch for now.  She has her annual scheduled end of the summer and she will follow-up at that time.  Sooner if she changes her mind and wants to proceed with pessary trial.    Anastasio Auerbach MD, 10:08 AM 03/19/2019

## 2019-03-19 NOTE — Patient Instructions (Signed)
Follow-up for your annual exam end of summer.  Follow-up sooner if your pelvic symptoms worsen or you want to try the pessary.

## 2019-03-20 ENCOUNTER — Other Ambulatory Visit: Payer: Self-pay

## 2019-03-20 MED ORDER — METOPROLOL SUCCINATE ER 25 MG PO TB24: 12.5000 mg | ORAL_TABLET | Freq: Every day | ORAL | 3 refills | Status: DC

## 2019-03-21 LAB — URINE CULTURE
MICRO NUMBER:: 620965
SPECIMEN QUALITY:: ADEQUATE

## 2019-03-21 LAB — URINALYSIS, COMPLETE W/RFL CULTURE
Bilirubin Urine: NEGATIVE
Glucose, UA: NEGATIVE
Hgb urine dipstick: NEGATIVE
Hyaline Cast: NONE SEEN /LPF
Ketones, ur: NEGATIVE
Nitrites, Initial: NEGATIVE
Protein, ur: NEGATIVE
RBC / HPF: NONE SEEN /HPF (ref 0–2)
Specific Gravity, Urine: 1.015 (ref 1.001–1.03)
pH: 7.5 (ref 5.0–8.0)

## 2019-03-21 LAB — CULTURE INDICATED

## 2019-03-22 ENCOUNTER — Telehealth: Payer: Self-pay | Admitting: Pharmacist

## 2019-03-22 DIAGNOSIS — E7800 Pure hypercholesterolemia, unspecified: Secondary | ICD-10-CM

## 2019-03-22 MED ORDER — NEXLETOL 180 MG PO TABS: 1.0000 | ORAL_TABLET | Freq: Every day | ORAL | 11 refills | Status: DC

## 2019-03-22 NOTE — Telephone Encounter (Signed)
Spoke with pt to see if she is willing to try Nexletol since she is intolerant to atorvastatin 80mg  daily, pravastatin 20mg  daily, rosuvastatin 5mg  daily, simvastatin 20mg  daily, Praluent 75mg  Q2W, and Zetia 5mg  daily. LDL currently 130 checked at PCP on 02/02/19, above goal < 70 due to ASCVD. She is agreeable. Will submit prior authorization for Nexletol - anticipated copay ~$100 per month - will apply for copay assistance through [facility] as well.  Pt approved for $2500 in copay assistance through the [employer] through 02/19/20. ID: [id number] PCN: [id number] GRP: [id number] BIN: [id number]

## 2019-03-27 NOTE — Telephone Encounter (Signed)
Nexletol PA approved, pt aware. Scheduled f/u labs in 3 months.

## 2019-04-02 ENCOUNTER — Telehealth: Payer: Self-pay

## 2019-04-02 NOTE — Telephone Encounter (Signed)
Covid-19 screening questions   Do you now or have you had a fever in the last 14 days no   Do you have any respiratory symptoms of shortness of breath or cough now or in the last 14 days no  Do you have any family members or close contacts with diagnosed or suspected Covid-19 in the past 14 days no  Have you been tested for Covid-19 and found to be positive no          

## 2019-04-03 ENCOUNTER — Telehealth: Payer: Self-pay

## 2019-04-03 ENCOUNTER — Ambulatory Visit (INDEPENDENT_AMBULATORY_CARE_PROVIDER_SITE_OTHER): Payer: PPO | Admitting: Gastroenterology

## 2019-04-03 ENCOUNTER — Encounter: Payer: Self-pay | Admitting: Gastroenterology

## 2019-04-03 VITALS — BP 122/66 | HR 64 | Temp 98.3°F | Ht 61.0 in | Wt 114.5 lb

## 2019-04-03 DIAGNOSIS — M545 Low back pain: Secondary | ICD-10-CM | POA: Diagnosis not present

## 2019-04-03 DIAGNOSIS — M546 Pain in thoracic spine: Secondary | ICD-10-CM

## 2019-04-03 DIAGNOSIS — R1013 Epigastric pain: Secondary | ICD-10-CM | POA: Diagnosis not present

## 2019-04-03 DIAGNOSIS — Z7902 Long term (current) use of antithrombotics/antiplatelets: Secondary | ICD-10-CM | POA: Diagnosis not present

## 2019-04-03 DIAGNOSIS — Z86010 Personal history of colonic polyps: Secondary | ICD-10-CM

## 2019-04-03 DIAGNOSIS — K219 Gastro-esophageal reflux disease without esophagitis: Secondary | ICD-10-CM | POA: Diagnosis not present

## 2019-04-03 MED ORDER — NA SULFATE-K SULFATE-MG SULF 17.5-3.13-1.6 GM/177ML PO SOLN: 1.0000 | Freq: Once | ORAL | 0 refills | Status: AC

## 2019-04-03 NOTE — Patient Instructions (Signed)
You have been scheduled for a HIDA scan at [facility] (1st floor) on 04/11/19. Please arrive 15 minutes prior to your scheduled appointment at  3:26ZT. Make certain not to have anything to eat or drink at least 6 hours prior to your test. Should this appointment date or time not work well for you, please call radiology scheduling at [phone].  _____________________________________________________________________ hepatobiliary (HIDA) scan is an imaging procedure used to diagnose problems in the liver, gallbladder and bile ducts. In the HIDA scan, a radioactive chemical or tracer is injected into a vein in your arm. The tracer is handled by the liver like bile. Bile is a fluid produced and excreted by your liver that helps your digestive system break down fats in the foods you eat. Bile is stored in your gallbladder and the gallbladder releases the bile when you eat a meal. A special nuclear medicine scanner (gamma camera) tracks the flow of the tracer from your liver into your gallbladder and small intestine.  During your HIDA scan  You'll be asked to change into a hospital gown before your HIDA scan begins. Your health care team will position you on a table, usually on your back. The radioactive tracer is then injected into a vein in your arm.The tracer travels through your bloodstream to your liver, where it's taken up by the bile-producing cells. The radioactive tracer travels with the bile from your liver into your gallbladder and through your bile ducts to your small intestine.You may feel some pressure while the radioactive tracer is injected into your vein. As you lie on the table, a special gamma camera is positioned over your abdomen taking pictures of the tracer as it moves through your body. The gamma camera takes pictures continually for about an hour. You'll need to keep still during the HIDA scan. This can become uncomfortable, but you may find that you can lessen the discomfort by  taking deep breaths and thinking about other things. Tell your health care team if you're uncomfortable. The radiologist will watch on a computer the progress of the radioactive tracer through your body. The HIDA scan may be stopped when the radioactive tracer is seen in the gallbladder and enters your small intestine. This typically takes about an hour. In some cases extra imaging will be performed if original images aren't satisfactory, if morphine is given to help visualize the gallbladder or if the medication CCK is given to look at the contraction of the gallbladder. This test typically takes 2 hours to complete. _________________________________________________________________  Sarah Ray have been scheduled for a colonoscopy. Please follow written instructions given to you at your visit today.  Please pick up your prep supplies at the pharmacy within the next 1-3 days. If you use inhalers (even only as needed), please bring them with you on the day of your procedure.  Thank you for choosing me and [facility].  Sarah Ray. Sarah Ray., MD., Marval Regal

## 2019-04-03 NOTE — Telephone Encounter (Signed)
[facility] Pre-operative Risk Assessment     Request for surgical clearance:     Endoscopy Procedure  What type of surgery is being performed?     Colonoscopy  When is this surgery scheduled?     04/26/19  What type of clearance is required ?   Pharmacy  Are there any medications that need to be held prior to surgery and how long? Plavix x 5 days  Practice name and name of physician performing surgery?      [facility]  What is your office phone and fax number?      Phone- [phone]  [phone]  Anesthesia type (None, local, MAC, general) ?       MAC

## 2019-04-03 NOTE — Progress Notes (Signed)
    History of Present Illness: This is a 78 year old female who relates frequent problems with postprandial mid back pain that often radiates to both shoulders and occasionally to her right shoulder and epigastrium.  She describes it as a burning pain.  She has no postprandial substernal burning pain.  The back pain happens frequently but not on a daily basis.  It is exacerbated by certain foods particularly high fat foods.  The pain can last 30 minutes to several hours.  Current Medications, Allergies, Past Medical History, Past Surgical History, Family History and Social History were reviewed in [employer] record.   Physical Exam: General: Well developed, well nourished, no acute distress Head: Normocephalic and atraumatic Eyes:  sclerae anicteric, EOMI Ears: Normal auditory acuity Mouth: No deformity or lesions Lungs: Clear throughout to auscultation Heart: Regular rate and rhythm; no murmurs, rubs or bruits Abdomen: Soft, non tender and non distended. No masses, hepatosplenomegaly or hernias noted. Normal Bowel sounds Rectal: Deferred to colonoscopy Musculoskeletal: Symmetrical with no gross deformities  Pulses:  Normal pulses noted Extremities: No clubbing, cyanosis, edema or deformities noted Neurological: Alert oriented x 4, grossly nonfocal Psychological:  Alert and cooperative. Normal mood and affect   Assessment and Recommendations:  1. Back pain, shoulder pain post meals. Occasional epigastric pain, R shoulder pain. RUQ US showed no biliary abnormalities. LFTs normal over past few years most recently in Dec. 2019. R/O biliary dyskinesia. Schedule HIDA with EF.   2. GERD.  Continue standard antireflux measures and pantoprazole 40 mg twice daily.  3. Personal history of adenomatous colon polyps.  Schedule surveillance colonoscopy. The risks (including bleeding, perforation, infection, missed lesions, medication reactions and possible hospitalization or  surgery if complications occur), benefits, and alternatives to colonoscopy with possible biopsy and possible polypectomy were discussed with the patient and they consent to proceed.   4. CAD with stent in 2015. STEMI in 2015. Hold Plavix 5 days before procedure - will instruct when and how to resume after procedure. Low but real risk of cardiovascular event such as heart attack, stroke, embolism, thrombosis or ischemia/infarct of other organs off Plavix explained and need to seek urgent help if this occurs. The patient consents to proceed. Will communicate by phone or EMR with patient's prescribing provider to confirm that holding Plavix is reasonable in this case.

## 2019-04-03 NOTE — Telephone Encounter (Signed)
   Primary Cardiologist: Jenkins Rouge, MD  Chart reviewed as part of pre-operative protocol coverage. Patient was contacted 04/03/2019 in reference to pre-operative risk assessment for pending surgery as outlined below.  Sarah Ray was last seen on 01/01/19 by Dr. Johnsie Cancel.  Since that day, Sarah Ray has done well with her chroic hot pain in her back.    Therefore, based on ACC/AHA guidelines, the patient would be at acceptable risk for the planned procedure without further cardiovascular testing.  She may hold Plavix for 5 days for colonoscopy.    I will route this recommendation to the requesting party via Epic fax function and remove from pre-op pool.  Please call with questions.  Cecilie Kicks, NP 04/03/2019, 2:20 PM

## 2019-04-11 ENCOUNTER — Ambulatory Visit (HOSPITAL_COMMUNITY)
Admission: RE | Admit: 2019-04-11 | Discharge: 2019-04-11 | Disposition: A | Discharge status: Home / Self Care | Payer: PPO | Source: Ambulatory Visit | Attending: Gastroenterology | Admitting: Gastroenterology

## 2019-04-11 ENCOUNTER — Other Ambulatory Visit: Payer: Self-pay

## 2019-04-11 DIAGNOSIS — M546 Pain in thoracic spine: Secondary | ICD-10-CM | POA: Diagnosis not present

## 2019-04-11 DIAGNOSIS — R1013 Epigastric pain: Secondary | ICD-10-CM | POA: Diagnosis not present

## 2019-04-11 DIAGNOSIS — M545 Low back pain: Secondary | ICD-10-CM | POA: Insufficient documentation

## 2019-04-11 DIAGNOSIS — R109 Unspecified abdominal pain: Secondary | ICD-10-CM | POA: Diagnosis not present

## 2019-04-11 MED ORDER — TECHNETIUM TC 99M MEBROFENIN IV KIT
5.2000 | PACK | Freq: Once | INTRAVENOUS | Status: AC
  Administered 2019-04-11: 09:00:00 5.2 via INTRAVENOUS

## 2019-04-19 ENCOUNTER — Telehealth: Payer: Self-pay | Admitting: Gastroenterology

## 2019-04-19 NOTE — Telephone Encounter (Signed)
Pt is scheduled for upcoming colon and would like to know when she can go off of Plavix.

## 2019-04-19 NOTE — Telephone Encounter (Signed)
See previous note from 04/03/19 regarding holding Plavix x 5 days. Patient states she does remember being told to hold Plavix but did not remember how long.

## 2019-04-25 ENCOUNTER — Telehealth: Payer: Self-pay | Admitting: Gastroenterology

## 2019-04-25 NOTE — Telephone Encounter (Signed)
Spoke with patient regarding Covid-19 screening questions °Covid-19 Screening Questions: ° °Do you now or have you had a fever in the last 14 days? No    °Do you have any respiratory symptoms of shortness of breath or cough now or in the last 14 days? no ° °Do you have any family members or close contacts with diagnosed or suspected Covid-19 in the past 14 days? no ° °Have you been tested for Covid-19 and found to be positive? no ° °Pt made aware of that care partner may wait in the car or come up to the lobby during the procedure but will need to provide their own mask. °

## 2019-04-26 ENCOUNTER — Encounter: Payer: Self-pay | Admitting: Gastroenterology

## 2019-04-26 ENCOUNTER — Other Ambulatory Visit: Payer: Self-pay

## 2019-04-26 ENCOUNTER — Ambulatory Visit (AMBULATORY_SURGERY_CENTER): Payer: PPO | Admitting: Gastroenterology

## 2019-04-26 VITALS — BP 137/63 | HR 50 | Temp 98.6°F | Resp 13 | Ht 61.0 in | Wt 114.0 lb

## 2019-04-26 DIAGNOSIS — Z1211 Encounter for screening for malignant neoplasm of colon: Secondary | ICD-10-CM | POA: Diagnosis not present

## 2019-04-26 DIAGNOSIS — D123 Benign neoplasm of transverse colon: Secondary | ICD-10-CM | POA: Diagnosis not present

## 2019-04-26 DIAGNOSIS — Z86010 Personal history of colonic polyps: Secondary | ICD-10-CM | POA: Diagnosis not present

## 2019-04-26 DIAGNOSIS — D124 Benign neoplasm of descending colon: Secondary | ICD-10-CM | POA: Diagnosis not present

## 2019-04-26 MED ORDER — SODIUM CHLORIDE 0.9 % IV SOLN: 500.0000 mL | Freq: Once | INTRAVENOUS | Status: DC

## 2019-04-26 NOTE — Progress Notes (Signed)
Pt's states no medical or surgical changes since previsit or office visit. 

## 2019-04-26 NOTE — Progress Notes (Signed)
PT taken to PACU. Monitors in place. VSS. Report given to RN. 

## 2019-04-26 NOTE — Op Note (Signed)
[facility] Patient Name: Sarah Ray Procedure Date: 04/26/2019 9:59 AM MRN: [id number] Endoscopist: Ladene Artist , MD Age: 78 Referring MD:  Date of Birth: 1941-07-18 Gender: Female Account #: [id number] Procedure:                Colonoscopy Indications:              Surveillance: Personal history of adenomatous                            polyps on last colonoscopy 5 years ago Medicines:                Monitored Anesthesia Care Procedure:                Pre-Anesthesia Assessment:                           - Prior to the procedure, a History and Physical                            was performed, and patient medications and                            allergies were reviewed. The patient's tolerance of                            previous anesthesia was also reviewed. The risks                            and benefits of the procedure and the sedation                            options and risks were discussed with the patient.                            All questions were answered, and informed consent                            was obtained. Prior Anticoagulants: The patient has                            taken Plavix (clopidogrel), last dose was 5 days                            prior to procedure. ASA Grade Assessment: III - A                            patient with severe systemic disease. After                            reviewing the risks and benefits, the patient was                            deemed in satisfactory condition to undergo the  procedure.                           After obtaining informed consent, the colonoscope                            was passed under direct vision. Throughout the                            procedure, the patient's blood pressure, pulse, and                            oxygen saturations were monitored continuously. The                            Colonoscope was introduced through the anus and              advanced to the the cecum, identified by                            appendiceal orifice and ileocecal valve. The                            ileocecal valve, appendiceal orifice, and rectum                            were photographed. The quality of the bowel                            preparation was good. The patient tolerated the                            procedure well. The colonoscopy was somewhat                            difficult due to significant looping and a tortuous                            colon. Scope In: 10:15:23 AM Scope Out: 10:33:56 AM Scope Withdrawal Time: 0 hours 13 minutes 50 seconds  Total Procedure Duration: 0 hours 18 minutes 33 seconds  Findings:                 The perianal and digital rectal examinations were                            normal.                           Two sessile polyps were found in the descending                            colon and transverse colon. The polyps were 7 to 8                            mm in size. These  polyps were removed with a cold                            snare. Resection and retrieval were complete.                           Multiple small-mouthed diverticula were found in                            the left colon. There was no evidence of                            diverticular bleeding.                           The exam was otherwise without abnormality on                            direct and retroflexion views. Complications:            No immediate complications. Estimated blood loss:                            None. Estimated Blood Loss:     Estimated blood loss: none. Impression:               - Two 7 to 8 mm polyps in the descending colon and                            in the transverse colon, removed with a cold snare.                            Resected and retrieved.                           - Mild diverticulosis in the left colon.                           - The examination was otherwise  normal on direct                            and retroflexion views. Recommendation:           - Resume Plavix (clopidogrel) tomorrow at prior                            dose. Refer to managing physician for further                            adjustment of therapy.                           - Patient has a contact number available for                            emergencies. The signs and symptoms of potential  delayed complications were discussed with the                            patient. Return to normal activities tomorrow.                            Written discharge instructions were provided to the                            patient.                           - High fiber diet.                           - Continue present medications.                           - Await pathology results.                           - No repeat colonoscopy due to age. Ladene Artist, MD 04/26/2019 10:38:57 AM This report has been signed electronically.

## 2019-04-26 NOTE — Patient Instructions (Signed)
YOU HAD AN ENDOSCOPIC PROCEDURE TODAY AT THE [facility]:   Refer to the procedure report that was given to you for any specific questions about what was found during the examination.  If the procedure report does not answer your questions, please call your gastroenterologist to clarify.  If you requested that your care partner not be given the details of your procedure findings, then the procedure report has been included in a sealed envelope for you to review at your convenience later.  YOU SHOULD EXPECT: Some feelings of bloating in the abdomen. Passage of more gas than usual.  Walking can help get rid of the air that was put into your GI tract during the procedure and reduce the bloating. If you had a lower endoscopy (such as a colonoscopy or flexible sigmoidoscopy) you may notice spotting of blood in your stool or on the toilet paper. If you underwent a bowel prep for your procedure, you may not have a normal bowel movement for a few days.  Please Note:  You might notice some irritation and congestion in your nose or some drainage.  This is from the oxygen used during your procedure.  There is no need for concern and it should clear up in a day or so.  SYMPTOMS TO REPORT IMMEDIATELY:   Following lower endoscopy (colonoscopy or flexible sigmoidoscopy):  Excessive amounts of blood in the stool  Significant tenderness or worsening of abdominal pains  Swelling of the abdomen that is new, acute  Fever of 100F or higher  For urgent or emergent issues, a gastroenterologist can be reached at any hour by calling [phone].   DIET:  We do recommend a small meal at first, but then you may proceed to your regular diet.  Drink plenty of fluids but you should avoid alcoholic beverages for 24 hours.  ACTIVITY:  You should plan to take it easy for the rest of today and you should NOT DRIVE or use heavy machinery until tomorrow (because of the sedation medicines used during the test).     FOLLOW UP: Our staff will call the number listed on your records 48-72 hours following your procedure to check on you and address any questions or concerns that you may have regarding the information given to you following your procedure. If we do not reach you, we will leave a message.  We will attempt to reach you two times.  During this call, we will ask if you have developed any symptoms of COVID 19. If you develop any symptoms (ie: fever, flu-like symptoms, shortness of breath, cough etc.) before then, please call [phone].  If you test positive for Covid 19 in the 2 weeks post procedure, please call and report this information to us.    If any biopsies were taken you will be contacted by phone or by letter within the next 1-3 weeks.  Please call us at [phone] if you have not heard about the biopsies in 3 weeks.    SIGNATURES/CONFIDENTIALITY: You and/or your care partner have signed paperwork which will be entered into your electronic medical record.  These signatures attest to the fact that that the information above on your After Visit Summary has been reviewed and is understood.  Full responsibility of the confidentiality of this discharge information lies with you and/or your care-partner. 

## 2019-04-26 NOTE — Progress Notes (Signed)
Called to room to assist during endoscopic procedure.  Patient ID and intended procedure confirmed with present staff. Received instructions for my participation in the procedure from the performing physician.  

## 2019-04-26 NOTE — Progress Notes (Signed)
Temperature taken by J.B., CMA, VS taken by C.W., CMA

## 2019-04-30 ENCOUNTER — Telehealth: Payer: Self-pay

## 2019-04-30 NOTE — Telephone Encounter (Signed)
  Follow up Call-  Call back number 04/26/2019 06/19/2018  Post procedure Call Back phone  # [phone]  Permission to leave phone message Yes Yes  Some recent data might be hidden     Patient questions:  Do you have a fever, pain , or abdominal swelling? No. Pain Score  0 *  Have you tolerated food without any problems? Yes.    Have you been able to return to your normal activities? Yes.    Do you have any questions about your discharge instructions: Diet   No. Medications  No. Follow up visit  No.  Do you have questions or concerns about your Care? No.  Actions: * If pain score is 4 or above: No action needed, pain <4.  Have you developed a fever since your procedure? No  2.   Have you had an respiratory symptoms (SOB or cough) since your procedure? No  3.   Have you tested positive for COVID 19 since your procedure? No  4.   Have you had any family members/close contacts diagnosed with the COVID 19 since your procedure?  No   If yes to any of these questions please route to Joylene John, RN and Alphonsa Gin, RN.

## 2019-05-06 ENCOUNTER — Encounter: Payer: Self-pay | Admitting: Gastroenterology

## 2019-05-10 ENCOUNTER — Telehealth: Payer: Self-pay | Admitting: Gastroenterology

## 2019-05-10 NOTE — Telephone Encounter (Signed)
All questions answered She is encouraged to begin a antireflux diet Patient instructed to maintain an anti-reflux diet. Advised to avoid caffeine, mint, citrus foods/juices, tomatoes,  chocolate, NSAIDS/ASA products.  Instructed not to eat within 2 hours of exercise or bed, multiple small meals are better than 3 large meals.  Need to take PPI 30 minutes prior to 1st meal of the day.   She will call back for any additional questions or concerns.

## 2019-05-11 ENCOUNTER — Telehealth: Payer: Self-pay | Admitting: Pharmacist

## 2019-05-11 NOTE — Telephone Encounter (Signed)
Patient called on 8/20 AM stating that she thinks the nexletol is cause back pain and pain her chest. States she has had irritation in her esophagus for a while, but that she eats healthy, doesn't eat greasy foods and eats a lot of fruits and only eats cooked tomatoes.  I did advice that cooked tomatoes are still acidic and a lot of fruits are too.  Advise that the best way to rule out if its from the medication is to stop the medication and see if things improve.  Patient in agreement to stop over the weekend but wants Jinny Blossom to call her Monday to discuss.

## 2019-05-14 NOTE — Telephone Encounter (Signed)
Returned call to pt - she reports tolerating her Nexletol well for the first month, then noticed burning in her chest/back. States she followed up with GI who reported no abnormalities other than some irritation in her esophagus. She has been taking a higher dose of Protonix BID for the past few months (started before she began on Nexletol). She stopped Nexletol 3-4 days ago and has not noticed any improvement in her symptoms. She wishes to work on improving her diet to help with acid reflux symptoms and will resume her Nexletol. She will call if symptoms appear worse on Nexletol in the future. Can consider referral to [facility] trial or Welchol.  Pt also reports her BP has been running a bit lower at home with systolic BP close to [id number]. BP typically runs [id number] systolic in the office. She reports occasional light-headedness, however overall feels well. She takes her time when ambulating. Encouraged pt to keep a log of her BP readings and to bring her home BP cuff to her next appt with Dr Johnsie Cancel. Ideally prefer to keep pt on beta blocker due to hx of MI - she is already taking the lowest dose of Toprol available.

## 2019-05-17 ENCOUNTER — Encounter: Payer: PPO | Admitting: Gynecology

## 2019-05-17 NOTE — Telephone Encounter (Signed)
Received message that pt called clinic yesterday while I was off. She states she resumed her Nexletol on Monday and experienced burning across her chest, back, and both arms (worse in the right arm). Burning continued into Tuesday and she almost went to the ED but did not. States symptoms have improved this AM, however she has not taken her Nexletol yet today. Reports systolic BP has also increased from 110 to 150.  Discussed that pt's symptoms can indicate MI. She states it does not feel like her previous MI. She does not wish to go to the ER. Advised to stop her Nexletol for the rest of the week. Advised her if any of her symptoms return that she needs to go to the ER. She verbalized understanding.

## 2019-05-20 ENCOUNTER — Other Ambulatory Visit: Payer: Self-pay | Admitting: Gastroenterology

## 2019-05-22 ENCOUNTER — Telehealth: Payer: Self-pay | Admitting: Pharmacist

## 2019-05-22 NOTE — Telephone Encounter (Signed)
Called pt to follow up - previously took [facility] 7/13-8/12, restarted 8/24-8/27. She reports feeling much better since stopping Nexletol on 8/27. Notices occasional burning but it is much improved off of Nexletol. She is still taking her Protonix BID.  She mentioned trying low dose rosuvastatin 5mg  3x per week once Nexletol is completely out of her system.  Also discussed [facility] trial (pt has hx of MI in June 5628, > 22 years of age, LDL > 80 in December 2019 but would need updated labs, [id number] exposure > 90 days ago) - pt is interested in learning more. Will forward to our research dept to follow up with pt regarding [facility] trial.

## 2019-05-23 DIAGNOSIS — Z1231 Encounter for screening mammogram for malignant neoplasm of breast: Secondary | ICD-10-CM | POA: Diagnosis not present

## 2019-05-23 DIAGNOSIS — Z803 Family history of malignant neoplasm of breast: Secondary | ICD-10-CM | POA: Diagnosis not present

## 2019-05-24 ENCOUNTER — Encounter: Payer: Self-pay | Admitting: Gynecology

## 2019-05-29 NOTE — Addendum Note (Signed)
Addended by: Nur Rabold E on: 05/29/2019 09:41 AM   Modules accepted: Orders

## 2019-05-29 NOTE — Telephone Encounter (Addendum)
Followed up with pt - she is still considering low dose rosuvastatin 3 days a week but would like to discuss the [facility] trial first. Will forward to research to follow up with pt.

## 2019-06-05 DIAGNOSIS — R922 Inconclusive mammogram: Secondary | ICD-10-CM | POA: Diagnosis not present

## 2019-06-06 ENCOUNTER — Encounter: Payer: Self-pay | Admitting: Gynecology

## 2019-06-22 DIAGNOSIS — K219 Gastro-esophageal reflux disease without esophagitis: Secondary | ICD-10-CM | POA: Diagnosis not present

## 2019-06-22 DIAGNOSIS — I10 Essential (primary) hypertension: Secondary | ICD-10-CM | POA: Diagnosis not present

## 2019-06-28 ENCOUNTER — Encounter: Payer: Self-pay | Admitting: Gynecology

## 2019-06-28 NOTE — Telephone Encounter (Signed)
Received message from research that pt is unsure if she's interested in the [place] trial at this time. Called pt and left message to discuss vs. trying low dose rosuvastatin 3 days a week.

## 2019-06-29 NOTE — Telephone Encounter (Signed)
Spoke with pt - she doesn't want to enroll in a clinical trial at this time due to continued GI issues. She was started on Pepcid last week. May be more interested once her GI issues are under control. She is having her lipids checked on Monday. She wants to wait until then before deciding if she wants to restart low dose rosuvastatin.

## 2019-07-02 ENCOUNTER — Other Ambulatory Visit: Payer: PPO | Admitting: *Deleted

## 2019-07-02 ENCOUNTER — Other Ambulatory Visit: Payer: Self-pay

## 2019-07-02 DIAGNOSIS — E782 Mixed hyperlipidemia: Secondary | ICD-10-CM | POA: Diagnosis not present

## 2019-07-02 DIAGNOSIS — E7800 Pure hypercholesterolemia, unspecified: Secondary | ICD-10-CM

## 2019-07-02 LAB — COMPREHENSIVE METABOLIC PANEL
ALT: 9 IU/L (ref 0–32)
AST: 16 IU/L (ref 0–40)
Albumin/Globulin Ratio: 2.1 (ref 1.2–2.2)
Albumin: 4.2 g/dL (ref 3.7–4.7)
Alkaline Phosphatase: 71 IU/L (ref 39–117)
BUN/Creatinine Ratio: 13 (ref 12–28)
BUN: 11 mg/dL (ref 8–27)
Bilirubin Total: 0.7 mg/dL (ref 0.0–1.2)
CO2: 25 mmol/L (ref 20–29)
Calcium: 9.9 mg/dL (ref 8.7–10.3)
Chloride: 101 mmol/L (ref 96–106)
Creatinine, Ser: 0.84 mg/dL (ref 0.57–1.00)
GFR calc Af Amer: 76 mL/min/{1.73_m2} (ref >59)
GFR calc non Af Amer: 66 mL/min/{1.73_m2} (ref >59)
Globulin, Total: 2 g/dL (ref 1.5–4.5)
Glucose: 86 mg/dL (ref 65–99)
Potassium: 4.3 mmol/L (ref 3.5–5.2)
Sodium: 137 mmol/L (ref 134–144)
Total Protein: 6.2 g/dL (ref 6.0–8.5)

## 2019-07-02 LAB — HEPATIC FUNCTION PANEL: Bilirubin, Direct: 0.19 mg/dL (ref 0.00–0.40)

## 2019-07-02 LAB — LIPID PANEL
Chol/HDL Ratio: 4 ratio (ref 0.0–4.4)
Cholesterol, Total: 206 mg/dL — ABNORMAL HIGH (ref 100–199)
HDL: 52 mg/dL (ref >39)
LDL Chol Calc (NIH): 135 mg/dL — ABNORMAL HIGH (ref 0–99)
Triglycerides: 108 mg/dL (ref 0–149)
VLDL Cholesterol Cal: 19 mg/dL (ref 5–40)

## 2019-07-02 NOTE — Progress Notes (Signed)
Evaluation Performed:  Follow-up visit  Date:  07/04/2019   ID:  Sarah Ray, DOB May 14, 1941, MRN [id number]  Provider Location: Office  PCP:  Raina Mina., MD  Cardiologist:  Jenkins Rouge, MD   Electrophysiologist:  None   Chief Complaint:  CAD/HLD   History of Present Illness:    78 y/o female with a history of CAD, s/p MI-RCA PCI June 2015. Cath done Jan 2018 showed patent RCA sites and non obstructive residual CAD. She was  admitted 05/04/18 with chest pain felt to be non cardiac. Intolerant to multiple statins with LDL above goal. Seen by lipid clinic and tried on Praluent.. This was stopped due to dizziness and flu like symptoms  Intolerant to Nexlitol with back pain Did not want to be enrolled in [place] trial She has known GERD plavix held without problems for EGD done 06/19/18 small polyps, small hiatal hernia and small area of angiodysplasia in duodenum  On Protonix Still having acidic Fruits and tomatoes  Having paresthesias in legs coreg changed to lopressor and zetia held Also had burning feeling with praluent LDL was 87 09/04/18   She has really failed all attempts at meds for her LDL She has been off everything  Did not want To be enrolled in [place] Trial   Most recent LDL 07/02/19 135   The patient does not have symptoms concerning for COVID-19 infection (fever, chills, cough, or new shortness of breath).    Past Medical History:  Diagnosis Date  . Adenomatous polyps 07/1997  . Breast fibroadenoma   . CAD (coronary artery disease) 03/18/14   STEMI- stent to RCA and nonobstructive disease LAD and LCX  . External hemorrhoids   . GERD (gastroesophageal reflux disease)    LA Class Grade C Erosive esophagitis  . Hiatal hernia   . HTN (hypertension)   . Hyperlipidemia   . Internal hemorrhoids   . Osteopenia 04/2018   T score -1.9 FRAX 21% / 5%  . Peptic stricture of esophagus   . Skin cancer 2014   "forehead"  . ST elevation myocardial infarction (STEMI)  involving right coronary artery in recovery phase ([place]) 03/18/14   Past Surgical History:  Procedure Laterality Date  . ABDOMINAL HYSTERECTOMY  1977   Leiomyoma  . BREAST CYST ASPIRATION Right ~ 2011  . BUNIONECTOMY Bilateral 04/04/2013  . CARDIAC CATHETERIZATION N/A 09/29/2016   Procedure: Left Heart Cath and Coronary Angiography;  Surgeon: Belva Crome, MD;  Location: [facility];  Service: Cardiovascular;  Laterality: N/A;  . CORONARY ANGIOPLASTY WITH STENT PLACEMENT  03/18/14   Xience DES to totally occl RCA, residual non obstructive disease to LAD and LCX  . ESOPHAGOGASTRODUODENOSCOPY (EGD) WITH ESOPHAGEAL DILATION  2000's X 1  . LEFT HEART CATH N/A 03/18/2014   Procedure: LEFT HEART CATH;  Surgeon: Blane Ohara, MD;  Location: [facility] CATH LAB;  Service: Cardiovascular;  Laterality: N/A;  . LEFT HEART CATHETERIZATION WITH CORONARY ANGIOGRAM N/A 04/22/2014   Procedure: LEFT HEART CATHETERIZATION WITH CORONARY ANGIOGRAM;  Surgeon: Blane Ohara, MD;  Location: [facility] CATH LAB;  Service: Cardiovascular;  Laterality: N/A;  . SKIN CANCER EXCISION  2014  . THYROID CYST EXCISION  1970's  . TONSILLECTOMY AND ADENOIDECTOMY  ~ 1950     Current Meds  Medication Sig  . CALCIUM-VITAMIN D PO Take 1 tablet by mouth 2 (two) times daily.   . clopidogrel (PLAVIX) 75 MG tablet TAKE 1 TABLET(75 MG) BY MOUTH AT BEDTIME  .  famotidine (PEPCID) 20 MG tablet Take 20 mg by mouth daily.  . metoprolol succinate (TOPROL-XL) 25 MG 24 hr tablet Take 0.5 tablets (12.5 mg total) by mouth daily. Take with or immediately following a meal.  . nitroGLYCERIN (NITROSTAT) 0.4 MG SL tablet Place 1 tablet (0.4 mg total) under the tongue every 5 (five) minutes as needed for chest pain.  . pantoprazole (PROTONIX) 40 MG tablet TAKE 1 TABLET(40 MG) BY MOUTH TWICE DAILY     Allergies:   Naprosyn [naproxen], Ticagrelor, Aspirin, Atorvastatin, Other, Bempedoic acid, Gabapentin, and Praluent [alirocumab]   Social History    Tobacco Use  . Smoking status: Never Smoker  . Smokeless tobacco: Never Used  Substance Use Topics  . Alcohol use: No    Alcohol/week: 0.0 standard drinks  . Drug use: No     Family Hx: The patient's family history includes Breast cancer in her daughter and maternal aunt; Colon cancer in her maternal aunt; Colon polyps in her mother; Heart disease in her brother. There is no history of Esophageal cancer.  ROS:   Please see the history of present illness.     All other systems reviewed and are negative.   Prior CV studies:   The following studies were reviewed today:  Echo 05/04/18 EF normal trivial AR Cath 09/29/16  40-50% distal RCA 40-50% proximal LAD 50-70% D2 and 40% mid circumflex EF normal    Labs/Other Tests and Data Reviewed:    EKG: 05/23/18 SR rate 68 normal    Recent Labs: 07/02/2019: ALT 9; BUN 11; Creatinine, Ser 0.84; Potassium 4.3; Sodium 137   Recent Lipid Panel Lab Results  Component Value Date/Time   CHOL 206 (H) 07/02/2019 08:15 AM   TRIG 108 07/02/2019 08:15 AM   HDL 52 07/02/2019 08:15 AM   CHOLHDL 4.0 07/02/2019 08:15 AM   CHOLHDL 2.8 06/17/2016 08:48 AM   LDLCALC 135 (H) 07/02/2019 08:15 AM   LDLDIRECT 112 (H) 06/15/2018 11:39 AM    Wt Readings from Last 3 Encounters:  07/04/19 108 lb (49 kg)  04/26/19 114 lb (51.7 kg)  04/03/19 114 lb 8 oz (51.9 kg)     Objective:    Vital Signs:  BP (!) 142/80   Pulse 65   Ht 5\' 1"  (1.549 m)   Wt 108 lb (49 kg)   SpO2 97%   BMI 20.41 kg/m    Well nourished, well developed female in no acute distress. Skin warm and dry No JVP elevation No tachypnea No edema  ASSESSMENT & PLAN:    1. CAD:  Moderate by cath 09/29/16 no angina observe Patent RCA stent  2. HLD:  Intolerant to statins, praluent and zetia and Nexlitol observe 3. GERD:  On protonix f/u primary EGD 06/19/18 benign f/u Dr Fuller Plan most of her symptoms are post prandial and at night   COVID-19 Education: The signs and symptoms of COVID-19  were discussed with the patient and how to seek care for testing (follow up with PCP or arrange E-visit).  The importance of social distancing was discussed today.  Time:   Today, I have spent 30 minutes with the patient  .     Medication Adjustments/Labs and Tests Ordered: Current medicines are reviewed at length with the patient today.  Concerns regarding medicines are outlined above.  Tests Ordered:  None  Medication Changes:  None   Disposition:  Follow up in a year  Signed, Jenkins Rouge, MD  07/04/2019 9:19 AM    [facility] 

## 2019-07-04 ENCOUNTER — Encounter: Payer: Self-pay | Admitting: Cardiovascular Disease

## 2019-07-04 ENCOUNTER — Other Ambulatory Visit: Payer: Self-pay

## 2019-07-04 ENCOUNTER — Ambulatory Visit: Payer: PPO | Admitting: Cardiovascular Disease

## 2019-07-04 VITALS — BP 142/80 | HR 65 | Ht 61.0 in | Wt 108.0 lb

## 2019-07-04 DIAGNOSIS — I2510 Atherosclerotic heart disease of native coronary artery without angina pectoris: Secondary | ICD-10-CM

## 2019-07-04 DIAGNOSIS — E782 Mixed hyperlipidemia: Secondary | ICD-10-CM

## 2019-07-04 NOTE — Patient Instructions (Addendum)

## 2019-07-05 ENCOUNTER — Telehealth: Payer: Self-pay | Admitting: Pharmacist

## 2019-07-05 MED ORDER — ROSUVASTATIN CALCIUM 5 MG PO TABS: ORAL_TABLET | ORAL | 11 refills | Status: DC

## 2019-07-05 NOTE — Telephone Encounter (Signed)
Spoke with pt, she is willing to retry low dose rosuvastatin 5mg  every other day. Will plan to recheck lipids in 3 months if pt is tolerating well.

## 2019-07-12 ENCOUNTER — Ambulatory Visit: Payer: PPO | Admitting: Cardiovascular Disease

## 2019-08-03 DIAGNOSIS — Z23 Encounter for immunization: Secondary | ICD-10-CM | POA: Diagnosis not present

## 2019-08-03 DIAGNOSIS — Z789 Other specified health status: Secondary | ICD-10-CM | POA: Diagnosis not present

## 2019-08-03 DIAGNOSIS — Z0000 Encounter for general adult medical examination without abnormal findings: Secondary | ICD-10-CM | POA: Diagnosis not present

## 2019-08-03 DIAGNOSIS — I700 Atherosclerosis of aorta: Secondary | ICD-10-CM | POA: Diagnosis not present

## 2019-08-03 DIAGNOSIS — E782 Mixed hyperlipidemia: Secondary | ICD-10-CM | POA: Diagnosis not present

## 2019-08-03 DIAGNOSIS — I2510 Atherosclerotic heart disease of native coronary artery without angina pectoris: Secondary | ICD-10-CM | POA: Diagnosis not present

## 2019-08-03 DIAGNOSIS — Z79899 Other long term (current) drug therapy: Secondary | ICD-10-CM | POA: Diagnosis not present

## 2019-08-03 DIAGNOSIS — I10 Essential (primary) hypertension: Secondary | ICD-10-CM | POA: Diagnosis not present

## 2019-08-03 DIAGNOSIS — M8589 Other specified disorders of bone density and structure, multiple sites: Secondary | ICD-10-CM | POA: Diagnosis not present

## 2019-08-03 DIAGNOSIS — M5030 Other cervical disc degeneration, unspecified cervical region: Secondary | ICD-10-CM | POA: Diagnosis not present

## 2019-08-03 DIAGNOSIS — F411 Generalized anxiety disorder: Secondary | ICD-10-CM | POA: Diagnosis not present

## 2019-08-03 DIAGNOSIS — K219 Gastro-esophageal reflux disease without esophagitis: Secondary | ICD-10-CM | POA: Diagnosis not present

## 2019-08-03 DIAGNOSIS — R5381 Other malaise: Secondary | ICD-10-CM | POA: Diagnosis not present

## 2019-09-05 DIAGNOSIS — H5203 Hypermetropia, bilateral: Secondary | ICD-10-CM | POA: Diagnosis not present

## 2019-09-05 DIAGNOSIS — H2513 Age-related nuclear cataract, bilateral: Secondary | ICD-10-CM | POA: Diagnosis not present

## 2019-09-05 DIAGNOSIS — D3132 Benign neoplasm of left choroid: Secondary | ICD-10-CM | POA: Diagnosis not present

## 2019-09-06 DIAGNOSIS — E559 Vitamin D deficiency, unspecified: Secondary | ICD-10-CM | POA: Diagnosis not present

## 2019-09-10 ENCOUNTER — Telehealth: Payer: Self-pay | Admitting: Pharmacist

## 2019-09-10 NOTE — Telephone Encounter (Signed)
Called pt to follow up with rosuvastatin tolerability. Pt reports tolerating rosuvastatin 5mg  QOD well. Reports taking Pepcid for acid reflux and is feeling much better. States her PCP checked her cholesterol last Thursday or Friday - do not see anything in [facility] or Care Everywhere. She will call to let us know her results - advised her we want her LDL cholesterol as close to 70 as possible. She verbalized understanding.

## 2019-09-12 DIAGNOSIS — Z131 Encounter for screening for diabetes mellitus: Secondary | ICD-10-CM | POA: Diagnosis not present

## 2019-09-12 DIAGNOSIS — R5381 Other malaise: Secondary | ICD-10-CM | POA: Diagnosis not present

## 2019-09-12 DIAGNOSIS — Z13220 Encounter for screening for lipoid disorders: Secondary | ICD-10-CM | POA: Diagnosis not present

## 2019-09-12 DIAGNOSIS — R5383 Other fatigue: Secondary | ICD-10-CM | POA: Diagnosis not present

## 2019-09-12 DIAGNOSIS — E538 Deficiency of other specified B group vitamins: Secondary | ICD-10-CM | POA: Diagnosis not present

## 2019-09-12 DIAGNOSIS — Z79899 Other long term (current) drug therapy: Secondary | ICD-10-CM | POA: Diagnosis not present

## 2019-09-12 DIAGNOSIS — Z1329 Encounter for screening for other suspected endocrine disorder: Secondary | ICD-10-CM | POA: Diagnosis not present

## 2019-09-12 DIAGNOSIS — Z13228 Encounter for screening for other metabolic disorders: Secondary | ICD-10-CM | POA: Diagnosis not present

## 2019-09-12 DIAGNOSIS — Z0000 Encounter for general adult medical examination without abnormal findings: Secondary | ICD-10-CM | POA: Diagnosis not present

## 2019-09-12 DIAGNOSIS — Z130 Encounter for screening for diseases of the blood and blood-forming organs and certain disorders involving the immune mechanism: Secondary | ICD-10-CM | POA: Diagnosis not present

## 2019-09-12 NOTE — Telephone Encounter (Signed)
Pt called clinic back, states LDL has improved from 135 to 94 checked at PCP since starting rosuvastatin 5mg  every other day, TC 165 improved from 206, HDL stable at 53.  Pt states after the new year she will think about increasing rosuvastatin to 5mg  daily. She will call us if she makes this change so that we can recheck a follow up lipid panel.

## 2019-09-18 ENCOUNTER — Other Ambulatory Visit: Payer: Self-pay | Admitting: Cardiovascular Disease

## 2019-09-27 DIAGNOSIS — H2512 Age-related nuclear cataract, left eye: Secondary | ICD-10-CM | POA: Diagnosis not present

## 2019-10-04 DIAGNOSIS — H25811 Combined forms of age-related cataract, right eye: Secondary | ICD-10-CM | POA: Diagnosis not present

## 2019-10-04 DIAGNOSIS — H2511 Age-related nuclear cataract, right eye: Secondary | ICD-10-CM | POA: Diagnosis not present

## 2019-10-17 ENCOUNTER — Other Ambulatory Visit: Payer: Self-pay

## 2019-10-17 MED ORDER — METOPROLOL SUCCINATE ER 25 MG PO TB24: 12.5000 mg | ORAL_TABLET | Freq: Every day | ORAL | 3 refills | Status: DC

## 2019-10-22 ENCOUNTER — Telehealth: Payer: Self-pay | Admitting: Pharmacist

## 2019-10-22 MED ORDER — ROSUVASTATIN CALCIUM 5 MG PO TABS: ORAL_TABLET | ORAL | 11 refills | Status: DC

## 2019-10-22 NOTE — Telephone Encounter (Signed)
Pt called clinic to report she has increased her rosuvastatin to 5 days a week and is tolerating this ok. New rx sent to pharmacy and med list updated. She will call later to schedule follow up lab work.

## 2019-11-13 ENCOUNTER — Other Ambulatory Visit: Payer: Self-pay | Admitting: Cardiovascular Disease

## 2019-11-30 ENCOUNTER — Telehealth: Payer: Self-pay | Admitting: Cardiovascular Disease

## 2019-11-30 DIAGNOSIS — E785 Hyperlipidemia, unspecified: Secondary | ICD-10-CM

## 2019-11-30 NOTE — Telephone Encounter (Signed)
New Message:   Pt says she will need a fasting lab order before her appt or on the day she have her appt with Dr Johnsie Cancel on 01-16-20.

## 2019-11-30 NOTE — Telephone Encounter (Signed)
Will place order for patient to get fasting lab work on day of her appointment with Dr. Johnsie Cancel. Will share this on Patient's Mychart.

## 2019-12-27 ENCOUNTER — Telehealth: Payer: Self-pay

## 2019-12-27 ENCOUNTER — Encounter: Payer: Self-pay | Admitting: Cardiology

## 2019-12-27 DIAGNOSIS — I499 Cardiac arrhythmia, unspecified: Secondary | ICD-10-CM | POA: Diagnosis not present

## 2019-12-27 DIAGNOSIS — R008 Other abnormalities of heart beat: Secondary | ICD-10-CM | POA: Diagnosis not present

## 2019-12-27 MED ORDER — METOPROLOL SUCCINATE ER 25 MG PO TB24: 25.0000 mg | ORAL_TABLET | Freq: Every day | ORAL | 3 refills | Status: DC

## 2019-12-27 MED ORDER — DILTIAZEM HCL ER COATED BEADS 120 MG PO CP24: 120.0000 mg | ORAL_CAPSULE | Freq: Every day | ORAL | 3 refills | Status: DC

## 2019-12-27 NOTE — Telephone Encounter (Signed)
Called patient to get her scheduled for A. FIB clinic. Patient scheduled for Monday. Patient's was concerned about her BP earlier today 188/94 75 and now 163/94  67 . Patient stated while she was at rehab her HR was in the 120's.Asked patient if she has done anything differently or under any stress. Patient stated her and her husband recently (2 weeks ago) lost their niece 53 yoa to cancer. Informed patient that a message would be sent to Dr. Johnsie Cancel to see if he wants her to take anything for her elevated BP.

## 2019-12-27 NOTE — Telephone Encounter (Signed)
-----   Message from [name], MD sent at 12/27/2019  9:55 AM EDT ----- [name] indicated she was seen in [place] at rehab and had new onset afib Needs appointment with afib clinic this week or early next

## 2019-12-27 NOTE — Telephone Encounter (Signed)
Increase her Toprol to full tablet 25 mg daily and add cardizem 120 mg CD daily Take Toprol in am and cardizem with lunch

## 2019-12-27 NOTE — Telephone Encounter (Signed)
Sent medications in for patient. Metoprolol 25 mg daily and cardizem 120 mg daily. Patient verbalized understanding and will call with any other concerns.

## 2019-12-31 ENCOUNTER — Other Ambulatory Visit: Payer: Self-pay

## 2019-12-31 ENCOUNTER — Encounter (HOSPITAL_COMMUNITY): Payer: Self-pay | Admitting: Physician Assistant

## 2019-12-31 ENCOUNTER — Ambulatory Visit (HOSPITAL_COMMUNITY)
Admission: RE | Admit: 2019-12-31 | Discharge: 2019-12-31 | Disposition: A | Discharge status: Home / Self Care | Payer: PPO | Source: Ambulatory Visit | Attending: Physician Assistant | Admitting: Physician Assistant

## 2019-12-31 VITALS — BP 150/80 | HR 55 | Ht 61.0 in | Wt 126.8 lb

## 2019-12-31 DIAGNOSIS — Z79899 Other long term (current) drug therapy: Secondary | ICD-10-CM | POA: Diagnosis not present

## 2019-12-31 DIAGNOSIS — D6869 Other thrombophilia: Secondary | ICD-10-CM | POA: Diagnosis not present

## 2019-12-31 DIAGNOSIS — I10 Essential (primary) hypertension: Secondary | ICD-10-CM | POA: Diagnosis not present

## 2019-12-31 DIAGNOSIS — Z8249 Family history of ischemic heart disease and other diseases of the circulatory system: Secondary | ICD-10-CM | POA: Diagnosis not present

## 2019-12-31 DIAGNOSIS — Z888 Allergy status to other drugs, medicaments and biological substances status: Secondary | ICD-10-CM | POA: Insufficient documentation

## 2019-12-31 DIAGNOSIS — I2510 Atherosclerotic heart disease of native coronary artery without angina pectoris: Secondary | ICD-10-CM | POA: Diagnosis not present

## 2019-12-31 DIAGNOSIS — I252 Old myocardial infarction: Secondary | ICD-10-CM | POA: Diagnosis not present

## 2019-12-31 DIAGNOSIS — E785 Hyperlipidemia, unspecified: Secondary | ICD-10-CM | POA: Insufficient documentation

## 2019-12-31 DIAGNOSIS — Z886 Allergy status to analgesic agent status: Secondary | ICD-10-CM | POA: Insufficient documentation

## 2019-12-31 DIAGNOSIS — Z955 Presence of coronary angioplasty implant and graft: Secondary | ICD-10-CM | POA: Diagnosis not present

## 2019-12-31 DIAGNOSIS — I480 Paroxysmal atrial fibrillation: Secondary | ICD-10-CM | POA: Diagnosis not present

## 2019-12-31 DIAGNOSIS — Z7901 Long term (current) use of anticoagulants: Secondary | ICD-10-CM | POA: Insufficient documentation

## 2019-12-31 DIAGNOSIS — Z85828 Personal history of other malignant neoplasm of skin: Secondary | ICD-10-CM | POA: Diagnosis not present

## 2019-12-31 LAB — BASIC METABOLIC PANEL
Anion gap: 7 (ref 5–15)
BUN: 10 mg/dL (ref 8–23)
CO2: 26 mmol/L (ref 22–32)
Calcium: 9.9 mg/dL (ref 8.9–10.3)
Chloride: 106 mmol/L (ref 98–111)
Creatinine, Ser: 0.75 mg/dL (ref 0.44–1.00)
GFR calc Af Amer: >60 mL/min (ref >60)
GFR calc non Af Amer: >60 mL/min (ref >60)
Glucose, Bld: 104 mg/dL — ABNORMAL HIGH (ref 70–99)
Potassium: 4.6 mmol/L (ref 3.5–5.1)
Sodium: 139 mmol/L (ref 135–145)

## 2019-12-31 LAB — CBC
HCT: 41.4 % (ref 36.0–46.0)
Hemoglobin: 13.4 g/dL (ref 12.0–15.0)
MCH: 33.3 pg (ref 26.0–34.0)
MCHC: 32.4 g/dL (ref 30.0–36.0)
MCV: 102.7 fL — ABNORMAL HIGH (ref 80.0–100.0)
Platelets: 200 10*3/uL (ref 150–400)
RBC: 4.03 MIL/uL (ref 3.87–5.11)
RDW: 12.5 % (ref 11.5–15.5)
WBC: 3.8 10*3/uL — ABNORMAL LOW (ref 4.0–10.5)
nRBC: 0 % (ref 0.0–0.2)

## 2019-12-31 LAB — TSH: TSH: 1.479 u[IU]/mL (ref 0.350–4.500)

## 2019-12-31 MED ORDER — APIXABAN 2.5 MG PO TABS: 2.5000 mg | ORAL_TABLET | Freq: Two times a day (BID) | ORAL | 3 refills | Status: DC

## 2019-12-31 NOTE — Patient Instructions (Signed)
Start Eliquis 2.5 mg twice a day

## 2019-12-31 NOTE — Progress Notes (Addendum)
Primary Care Physician: Raina Mina., MD Primary Cardiologist: Dr Johnsie Cancel Primary Electrophysiologist: none Referring Physician: Dr Vernon Prey is a 79 y.o. female with a history of CAD, HTN, HLD, and new onset paroxysmal atrial fibrillation who presents for consultation in the [facility].  The patient was initially diagnosed with atrial fibrillation on 12/27/19 in cardiac rehab. She was asymptomatic but noted that her heart rate was elevated. ECG showed afib with RVR. Her metoprolol was increased and she was started on diltiazem. Patient has a CHADS2VASC score of 5. She denies snoring or alcohol use. Of note, she recently had her second COVID vaccine. She is back in SR today.   Today, she denies symptoms of palpitations, chest pain, shortness of breath, orthopnea, PND, lower extremity edema, dizziness, presyncope, syncope, snoring, daytime somnolence, bleeding, or neurologic sequela. The patient is tolerating medications without difficulties and is otherwise without complaint today.    Atrial Fibrillation Risk Factors:  she does not have symptoms or diagnosis of sleep apnea. she does not have a history of rheumatic fever. she does not have a history of alcohol use. The patient does have a history of early familial atrial fibrillation or other arrhythmias. Brother has afib and PPM.  she has a BMI of Body mass index is 23.96 kg/m.Marland Kitchen Filed Weights   12/31/19 0922  Weight: 57.5 kg    Family History  Problem Relation Age of Onset  . Colon polyps Mother   . Colon cancer Maternal Aunt   . Breast cancer Maternal Aunt        Age 48  . Breast cancer Daughter        Age 42  . Heart disease Brother        irregular heart beat  . Esophageal cancer Neg Hx      Atrial Fibrillation Management history:  Previous antiarrhythmic drugs: none Previous cardioversions: none Previous ablations: none CHADS2VASC score: 5 Anticoagulation history: none, on  Plavix   Past Medical History:  Diagnosis Date  . Adenomatous polyps 07/1997  . Breast fibroadenoma   . CAD (coronary artery disease) 03/18/14   STEMI- stent to RCA and nonobstructive disease LAD and LCX  . External hemorrhoids   . GERD (gastroesophageal reflux disease)    LA Class Grade C Erosive esophagitis  . Hiatal hernia   . HTN (hypertension)   . Hyperlipidemia   . Internal hemorrhoids   . Osteopenia 04/2018   T score -1.9 FRAX 21% / 5%  . Peptic stricture of esophagus   . Skin cancer 2014   "forehead"  . ST elevation myocardial infarction (STEMI) involving right coronary artery in recovery phase ([place]) 03/18/14   Past Surgical History:  Procedure Laterality Date  . ABDOMINAL HYSTERECTOMY  1977   Leiomyoma  . BREAST CYST ASPIRATION Right ~ 2011  . BUNIONECTOMY Bilateral 04/04/2013  . CARDIAC CATHETERIZATION N/A 09/29/2016   Procedure: Left Heart Cath and Coronary Angiography;  Surgeon: Belva Crome, MD;  Location: [facility];  Service: Cardiovascular;  Laterality: N/A;  . CORONARY ANGIOPLASTY WITH STENT PLACEMENT  03/18/14   Xience DES to totally occl RCA, residual non obstructive disease to LAD and LCX  . ESOPHAGOGASTRODUODENOSCOPY (EGD) WITH ESOPHAGEAL DILATION  2000's X 1  . LEFT HEART CATH N/A 03/18/2014   Procedure: LEFT HEART CATH;  Surgeon: Blane Ohara, MD;  Location: [facility] CATH LAB;  Service: Cardiovascular;  Laterality: N/A;  . LEFT HEART CATHETERIZATION WITH CORONARY  ANGIOGRAM N/A 04/22/2014   Procedure: LEFT HEART CATHETERIZATION WITH CORONARY ANGIOGRAM;  Surgeon: Blane Ohara, MD;  Location: [facility] CATH LAB;  Service: Cardiovascular;  Laterality: N/A;  . SKIN CANCER EXCISION  2014  . THYROID CYST EXCISION  1970's  . TONSILLECTOMY AND ADENOIDECTOMY  ~ 1950    Current Outpatient Medications  Medication Sig Dispense Refill  . calcium-vitamin D (OSCAL WITH D) 500-200 MG-UNIT TABS tablet Take by mouth.    Marland Kitchen CALCIUM-VITAMIN D PO Take 1 tablet by mouth 2  (two) times daily.     . clopidogrel (PLAVIX) 75 MG tablet TAKE 1 TABLET(75 MG) BY MOUTH AT BEDTIME 90 tablet 2  . diltiazem (CARDIZEM CD) 120 MG 24 hr capsule Take 1 capsule (120 mg total) by mouth daily. 90 capsule 3  . famotidine (PEPCID) 20 MG tablet Take 20 mg by mouth daily.    . metoprolol succinate (TOPROL-XL) 25 MG 24 hr tablet Take 1 tablet (25 mg total) by mouth daily. Take with or immediately following a meal. 90 tablet 3  . nitroGLYCERIN (NITROSTAT) 0.4 MG SL tablet Place 1 tablet (0.4 mg total) under the tongue every 5 (five) minutes as needed for chest pain. 25 tablet 5  . pantoprazole (PROTONIX) 40 MG tablet TAKE 1 TABLET(40 MG) BY MOUTH TWICE DAILY 60 tablet 11  . rosuvastatin (CRESTOR) 5 MG tablet Take 1 tablet by mouth 5 days a week to daily as tolerated 30 tablet 11  . apixaban (ELIQUIS) 2.5 MG TABS tablet Take 1 tablet (2.5 mg total) by mouth 2 (two) times daily. 60 tablet 3   No current facility-administered medications for this encounter.    Allergies  Allergen Reactions  . Naprosyn [Naproxen] Hives  . Ticagrelor Shortness Of Breath and Other (See Comments)    Caused chest pains Caused chest pains  . Aspirin Hives, Diarrhea and Swelling    Benadryl stops reaction  . Atorvastatin Other (See Comments)    Myalgias and leg heaviness  Myalgias and leg heaviness  . Other Other (See Comments)    Very sensitive to all meds Very sensitive to all meds  . Bempedoic Acid     Burning sensation  . Gabapentin     dizziness  . Praluent [Alirocumab]     Flu like symptoms    Social History   Socioeconomic History  . Marital status: Married    Spouse name: [name]  . Number of children: 2  . Years of education: Associates  . Highest education level: Not on file  Occupational History  . Occupation: Dance movement psychotherapist  Tobacco Use  . Smoking status: Never Smoker  . Smokeless tobacco: Never Used  Substance and Sexual Activity  . Alcohol use: No    Alcohol/week: 0.0  standard drinks  . Drug use: No  . Sexual activity: Not Currently    Birth control/protection: Surgical, Post-menopausal    Comment: HYST-1st intercourse 79 yo-Fewer than 5 partners  Other Topics Concern  . Not on file  Social History Narrative   Lives with husband   Daily caffeine:  1 cup per day (coffee or tea)   Social Determinants of Health   Financial Resource Strain:   . Difficulty of Paying Living Expenses:   Food Insecurity:   . Worried About Charity fundraiser in the Last Year:   . Arboriculturist in the Last Year:   Transportation Needs:   . Film/video editor (Medical):   Marland Kitchen Lack of Transportation (Non-Medical):   Physical  Activity:   . Days of Exercise per Week:   . Minutes of Exercise per Session:   Stress:   . Feeling of Stress :   Social Connections:   . Frequency of Communication with Friends and Family:   . Frequency of Social Gatherings with Friends and Family:   . Attends Religious Services:   . Active Member of Clubs or Organizations:   . Attends Archivist Meetings:   Marland Kitchen Marital Status:   Intimate Partner Violence:   . Fear of Current or Ex-Partner:   . Emotionally Abused:   Marland Kitchen Physically Abused:   . Sexually Abused:      ROS- All systems are reviewed and negative except as per the HPI above.  Physical Exam: Vitals:   12/31/19 0922  BP: (!) 150/80  Pulse: (!) 55  Weight: 57.5 kg  Height: 5\' 1"  (1.549 m)    GEN- The patient is well appearing elderly female, alert and oriented x 3 today.   Head- normocephalic, atraumatic Eyes-  Sclera clear, conjunctiva pink Ears- hearing intact Oropharynx- clear Neck- supple  Lungs- Clear to ausculation bilaterally, normal work of breathing Heart- Regular rate and rhythm, no murmurs, rubs or gallops  GI- soft, NT, ND, + BS Extremities- no clubbing, cyanosis, or edema MS- no significant deformity or atrophy Skin- no rash or lesion Psych- euthymic mood, full affect Neuro- strength and  sensation are intact  Wt Readings from Last 3 Encounters:  12/31/19 57.5 kg  07/04/19 49 kg  04/26/19 51.7 kg    EKG today demonstrates SB HR 55, PR 154, QRS 96, QTc 407  Echo 05/04/18 demonstrated  - Left ventricle: The cavity size was normal. Systolic function was  normal. The estimated ejection fraction was in the range of 55%  to 60%. Wall motion was normal; there were no regional wall  motion abnormalities. Left ventricular diastolic function  parameters were normal.  - Aortic valve: There was trivial regurgitation.  - Mitral valve: Valve area by pressure half-time: 1.72 cm^2.  - Left atrium: The atrium was mildly dilated.  Epic records are reviewed at length today  CHA2DS2-VASc Score = 5 The patient's score is based upon: CHF History: No HTN History: Yes Age : 77 + Diabetes History: No Stroke History: No Vascular Disease History: Yes Gender: Female      ASSESSMENT AND PLAN: 1. Paroxysmal Atrial Fibrillation (ICD10:  I48.0) The patient's CHA2DS2-VASc score is 5, indicating a 7.2% annual risk of stroke.  General education about afib provided and questions answered. We also discussed her stroke risk and the risks and benefits of anticoagulation. ? If recent episode related to recent death in family vs COVID vaccine. Will plan to start Eliquis 2.5 mg BID (age, weight <60 kg).  Check bmet/cbc/TSH Will d/w primary cardiologist about Plavix. Continue metoprolol 25 mg daily Continue diltiazem 120 mg daily  2. Secondary Hypercoagulable State (ICD10:  D68.69) The patient is at significant risk for stroke/thromboembolism based upon her CHA2DS2-VASc Score of 5.  Start Apixaban (Eliquis).   3. CAD S/p MI 2015 with PCI to RCA. No anginal symptoms. Followed by Dr Johnsie Cancel.  4. HTN Stable, no changes today.   Follow up with Dr Johnsie Cancel as scheduled.  Addendum: After d/w Dr Johnsie Cancel, will continue Plavix with addition of Eliquis due to significant CAD.   [facility] [address] [place], [place] [address] [phone] 12/31/2019 1:44 PM

## 2020-01-01 NOTE — Addendum Note (Signed)
Encounter addended by: Oliver Barre, PA on: 01/01/2020 4:29 PM  Actions taken: Clinical Note Signed

## 2020-01-07 ENCOUNTER — Telehealth: Payer: Self-pay | Admitting: Cardiovascular Disease

## 2020-01-07 NOTE — Telephone Encounter (Signed)
Pt c/o medication issue:  1. Name of Medication:  apixaban (ELIQUIS) 2.5 MG TABS tablet diltiazem (CARDIZEM CD) 120 MG 24 hr capsule  2. How are you currently taking this medication (dosage and times per day)?  As directed  3. Are you having a reaction (difficulty breathing--STAT)? No  4. What is your medication issue? Pt is having Diarrhea and burning in her esophagus. This started ~2 days after she started the medication. She would like to speak with the nurse about adjusting her medication

## 2020-01-07 NOTE — Telephone Encounter (Signed)
Pt had severe diarrhea and indigestion the last 4 days she is slightly better today but she did hold her cardizem 120mg  and eliquis yesterday. She denies blood in sputum/stool or cough. Discussed with Adline Peals PA will eliminate cardizem since HRs are controlled and metoprolol was increased at same time of addition. Pt will call back on Wednesday with update. If still having issues will switch eliquis to xarelto. Pt in agreement.

## 2020-01-08 DIAGNOSIS — R928 Other abnormal and inconclusive findings on diagnostic imaging of breast: Secondary | ICD-10-CM | POA: Diagnosis not present

## 2020-01-08 NOTE — Progress Notes (Signed)
Evaluation Performed:  Follow-up visit  Date:  01/16/2020   ID:  Sarah Ray, DOB [birth year], MRN [id number]  Provider Location: Office  PCP:  Raina Mina., MD  Cardiologist:  Jenkins Rouge, MD   Electrophysiologist:  None   Chief Complaint:  CAD/HLD /PAF   History of Present Illness:    79 y/o female with a history of CAD, s/p MI-RCA PCI June 2015. Cath done Jan 2018 showed patent RCA sites and non obstructive residual CAD. She was  admitted 05/04/18 with chest pain felt to be non cardiac. Intolerant to multiple statins with LDL above goal. Seen by lipid clinic and tried on Praluent.. This was stopped due to dizziness and flu like symptoms  Intolerant to Nexlitol with back pain Did not want to be enrolled in [place] trial She has known GERD plavix held without problems for EGD done 06/19/18 small polyps, small hiatal hernia and small area of angiodysplasia in duodenum     Having paresthesias in legs coreg changed to lopressor and zetia held Also had burning feeling with praluent LDL was 87 09/04/18   She has really failed all attempts at meds for her LDL She has been off everything     Most recent LDL 07/02/19 135   New onset PAF noted in cardiac rehab on 12/27/19 Asymptomatic with elevated HR Toprol dose increased to 25 ,g daily and Cardizem 120 mg added Anticoagulation started with eliquis Had 4 days diarrhea and Cardizem d/c CHADVASC 5 PAF noted in relation to COVID vaccine When seen in Afib clinic 12/31/19 was in NSR  She is allergic to ASA and has been on low dose eliquis and plavix given her CAD   She never knew she was in afib   The patient does not have symptoms concerning for COVID-19 infection (fever, chills, cough, or new shortness of breath).    Past Medical History:  Diagnosis Date  . Adenomatous polyps 07/1997  . Breast fibroadenoma   . CAD (coronary artery disease) 03/18/14   STEMI- stent to RCA and nonobstructive disease LAD and LCX  . External hemorrhoids   .  GERD (gastroesophageal reflux disease)    LA Class Grade C Erosive esophagitis  . Hiatal hernia   . HTN (hypertension)   . Hyperlipidemia   . Internal hemorrhoids   . Osteopenia 04/2018   T score -1.9 FRAX 21% / 5%  . Peptic stricture of esophagus   . Skin cancer 2014   "forehead"  . ST elevation myocardial infarction (STEMI) involving right coronary artery in recovery phase ([place]) 03/18/14   Past Surgical History:  Procedure Laterality Date  . ABDOMINAL HYSTERECTOMY  1977   Leiomyoma  . BREAST CYST ASPIRATION Right ~ 2011  . BUNIONECTOMY Bilateral 04/04/2013  . CARDIAC CATHETERIZATION N/A 09/29/2016   Procedure: Left Heart Cath and Coronary Angiography;  Surgeon: Belva Crome, MD;  Location: [place] [facility];  Service: Cardiovascular;  Laterality: N/A;  . CORONARY ANGIOPLASTY WITH STENT PLACEMENT  03/18/14   Xience DES to totally occl RCA, residual non obstructive disease to LAD and LCX  . ESOPHAGOGASTRODUODENOSCOPY (EGD) WITH ESOPHAGEAL DILATION  2000's X 1  . LEFT HEART CATH N/A 03/18/2014   Procedure: LEFT HEART CATH;  Surgeon: Blane Ohara, MD;  Location: [facility] CATH LAB;  Service: Cardiovascular;  Laterality: N/A;  . LEFT HEART CATHETERIZATION WITH CORONARY ANGIOGRAM N/A 04/22/2014   Procedure: LEFT HEART CATHETERIZATION WITH CORONARY ANGIOGRAM;  Surgeon: Blane Ohara, MD;  Location: [facility]  CATH LAB;  Service: Cardiovascular;  Laterality: N/A;  . SKIN CANCER EXCISION  2014  . THYROID CYST EXCISION  1970's  . TONSILLECTOMY AND ADENOIDECTOMY  ~ 1950     No outpatient medications have been marked as taking for the 01/16/20 encounter (Office Visit) with [name], MD.     Allergies:   Naprosyn [naproxen], Ticagrelor, Aspirin, Atorvastatin, Other, Bempedoic acid, Gabapentin, and Praluent [alirocumab]   Social History   Tobacco Use  . Smoking status: Never Smoker  . Smokeless tobacco: Never Used  Substance Use Topics  . Alcohol use: No    Alcohol/week: 0.0 standard drinks   . Drug use: No     Family Hx: The patient's family history includes Breast cancer in her daughter and maternal aunt; Colon cancer in her maternal aunt; Colon polyps in her mother; Heart disease in her brother. There is no history of Esophageal cancer.  ROS:   Please see the history of present illness.     All other systems reviewed and are negative.   Prior CV studies:   The following studies were reviewed today:  Echo 05/04/18 EF normal trivial AR Cath 09/29/16  40-50% distal RCA 40-50% proximal LAD 50-70% D2 and 40% mid circumflex EF normal    Labs/Other Tests and Data Reviewed:    EKG: 05/23/18 SR rate 68 normal    Recent Labs: 07/02/2019: ALT 9 12/31/2019: BUN 10; Creatinine, Ser 0.75; Hemoglobin 13.4; Platelets 200; Potassium 4.6; Sodium 139; TSH 1.479   Recent Lipid Panel Lab Results  Component Value Date/Time   CHOL 206 (H) 07/02/2019 08:15 AM   TRIG 108 07/02/2019 08:15 AM   HDL 52 07/02/2019 08:15 AM   CHOLHDL 4.0 07/02/2019 08:15 AM   CHOLHDL 2.8 06/17/2016 08:48 AM   LDLCALC 135 (H) 07/02/2019 08:15 AM   LDLDIRECT 112 (H) 06/15/2018 11:39 AM    Wt Readings from Last 3 Encounters:  01/16/20 124 lb (56.2 kg)  12/31/19 126 lb 12.8 oz (57.5 kg)  07/04/19 108 lb (49 kg)     Objective:    Vital Signs:  BP 138/62   Pulse (!) 54   Ht 5\' 1"  (1.549 m)   Wt 124 lb (56.2 kg)   SpO2 99%   BMI 23.43 kg/m    Affect appropriate Healthy:  appears stated age HEENT: normal Neck supple with no adenopathy JVP normal no bruits no thyromegaly Lungs clear with no wheezing and good diaphragmatic motion Heart:  S1/S2 no murmur, no rub, gallop or click PMI normal Abdomen: benighn, BS positve, no tenderness, no AAA no bruit.  No HSM or HJR Distal pulses intact with no bruits No edema Neuro non-focal Skin warm and dry No muscular weakness   ASSESSMENT & PLAN:    1. CAD:  Moderate by cath 09/29/16 no angina observe Patent RCA stent  2. HLD:  Intolerant to statins,  praluent and zetia and Nexlitol observe 3. GERD:  On protonix f/u primary EGD 06/19/18 benign f/u Dr Fuller Plan most of her symptoms are post prandial and at night  4. PAF:  Temporally related to COVID vaccine CHADVASC 5 on low dose eliquis due to age/weight Continue Toprol NSR today Cardizem d/c due to diarrhea Consider monitor in a year and if no recurrence can consider d/c anticoagulation   COVID-19 Education:   Medication Adjustments/Labs and Tests Ordered: Current medicines are reviewed at length with the patient today.  Concerns regarding medicines are outlined above.  Tests Ordered:  None   Medication Changes:  None   Disposition:  Follow up in 6 months   Signed, Jenkins Rouge, MD  01/16/2020 8:50 AM    [place]

## 2020-01-09 NOTE — Telephone Encounter (Signed)
Pt called with update since stopping cardizem -- symptoms have improved she still has reflux with certain foods and is going to call her GI doctor to follow up for this. Pt does states she wants to talk about coming off eliquis with dr Johnsie Cancel next week since she only had afib 2 days but will continue for now.

## 2020-01-10 ENCOUNTER — Encounter: Payer: Self-pay | Admitting: Gynecology

## 2020-01-16 ENCOUNTER — Other Ambulatory Visit: Payer: PPO | Admitting: *Deleted

## 2020-01-16 ENCOUNTER — Ambulatory Visit: Payer: PPO | Admitting: Cardiovascular Disease

## 2020-01-16 ENCOUNTER — Encounter: Payer: Self-pay | Admitting: Cardiovascular Disease

## 2020-01-16 ENCOUNTER — Other Ambulatory Visit: Payer: Self-pay

## 2020-01-16 VITALS — BP 138/62 | HR 54 | Ht 61.0 in | Wt 124.0 lb

## 2020-01-16 DIAGNOSIS — E785 Hyperlipidemia, unspecified: Secondary | ICD-10-CM

## 2020-01-16 DIAGNOSIS — I480 Paroxysmal atrial fibrillation: Secondary | ICD-10-CM | POA: Diagnosis not present

## 2020-01-16 LAB — LIPID PANEL
Chol/HDL Ratio: 2.9 ratio (ref 0.0–4.4)
Cholesterol, Total: 158 mg/dL (ref 100–199)
HDL: 55 mg/dL (ref >39)
LDL Chol Calc (NIH): 84 mg/dL (ref 0–99)
Triglycerides: 105 mg/dL (ref 0–149)
VLDL Cholesterol Cal: 19 mg/dL (ref 5–40)

## 2020-01-16 LAB — HEPATIC FUNCTION PANEL
ALT: 12 IU/L (ref 0–32)
AST: 19 IU/L (ref 0–40)
Albumin: 4.3 g/dL (ref 3.7–4.7)
Alkaline Phosphatase: 96 IU/L (ref 39–117)
Bilirubin Total: 0.7 mg/dL (ref 0.0–1.2)
Bilirubin, Direct: 0.2 mg/dL (ref 0.00–0.40)
Total Protein: 6.5 g/dL (ref 6.0–8.5)

## 2020-01-16 MED ORDER — APIXABAN 2.5 MG PO TABS: 2.5000 mg | ORAL_TABLET | Freq: Two times a day (BID) | ORAL | 0 refills | Status: DC

## 2020-01-16 NOTE — Patient Instructions (Signed)

## 2020-01-16 NOTE — Addendum Note (Signed)
Addended by: Aris Georgia, Sharmel Ballantine L on: 01/16/2020 09:03 AM   Modules accepted: Orders

## 2020-02-01 DIAGNOSIS — E538 Deficiency of other specified B group vitamins: Secondary | ICD-10-CM | POA: Diagnosis not present

## 2020-02-01 DIAGNOSIS — F411 Generalized anxiety disorder: Secondary | ICD-10-CM | POA: Diagnosis not present

## 2020-02-01 DIAGNOSIS — D6869 Other thrombophilia: Secondary | ICD-10-CM | POA: Diagnosis not present

## 2020-02-01 DIAGNOSIS — E782 Mixed hyperlipidemia: Secondary | ICD-10-CM | POA: Diagnosis not present

## 2020-02-01 DIAGNOSIS — I480 Paroxysmal atrial fibrillation: Secondary | ICD-10-CM | POA: Diagnosis not present

## 2020-02-01 DIAGNOSIS — Z789 Other specified health status: Secondary | ICD-10-CM | POA: Diagnosis not present

## 2020-02-01 DIAGNOSIS — I2510 Atherosclerotic heart disease of native coronary artery without angina pectoris: Secondary | ICD-10-CM | POA: Diagnosis not present

## 2020-02-01 DIAGNOSIS — R5383 Other fatigue: Secondary | ICD-10-CM | POA: Diagnosis not present

## 2020-02-01 DIAGNOSIS — K219 Gastro-esophageal reflux disease without esophagitis: Secondary | ICD-10-CM | POA: Diagnosis not present

## 2020-02-01 DIAGNOSIS — I10 Essential (primary) hypertension: Secondary | ICD-10-CM | POA: Diagnosis not present

## 2020-02-01 DIAGNOSIS — I700 Atherosclerosis of aorta: Secondary | ICD-10-CM | POA: Diagnosis not present

## 2020-02-01 DIAGNOSIS — Z79899 Other long term (current) drug therapy: Secondary | ICD-10-CM | POA: Diagnosis not present

## 2020-02-01 DIAGNOSIS — R5381 Other malaise: Secondary | ICD-10-CM | POA: Diagnosis not present

## 2020-02-15 DIAGNOSIS — E782 Mixed hyperlipidemia: Secondary | ICD-10-CM | POA: Diagnosis not present

## 2020-02-15 DIAGNOSIS — R5381 Other malaise: Secondary | ICD-10-CM | POA: Diagnosis not present

## 2020-02-15 DIAGNOSIS — E042 Nontoxic multinodular goiter: Secondary | ICD-10-CM | POA: Diagnosis not present

## 2020-02-15 DIAGNOSIS — I10 Essential (primary) hypertension: Secondary | ICD-10-CM | POA: Diagnosis not present

## 2020-02-15 DIAGNOSIS — I2510 Atherosclerotic heart disease of native coronary artery without angina pectoris: Secondary | ICD-10-CM | POA: Diagnosis not present

## 2020-02-15 DIAGNOSIS — F418 Other specified anxiety disorders: Secondary | ICD-10-CM | POA: Diagnosis not present

## 2020-02-15 DIAGNOSIS — Z9889 Other specified postprocedural states: Secondary | ICD-10-CM | POA: Diagnosis not present

## 2020-02-15 DIAGNOSIS — R079 Chest pain, unspecified: Secondary | ICD-10-CM | POA: Diagnosis not present

## 2020-02-15 DIAGNOSIS — Z79899 Other long term (current) drug therapy: Secondary | ICD-10-CM | POA: Diagnosis not present

## 2020-02-15 DIAGNOSIS — E538 Deficiency of other specified B group vitamins: Secondary | ICD-10-CM | POA: Diagnosis not present

## 2020-02-15 DIAGNOSIS — M791 Myalgia, unspecified site: Secondary | ICD-10-CM | POA: Diagnosis not present

## 2020-02-15 DIAGNOSIS — I480 Paroxysmal atrial fibrillation: Secondary | ICD-10-CM | POA: Diagnosis not present

## 2020-02-15 DIAGNOSIS — R5383 Other fatigue: Secondary | ICD-10-CM | POA: Diagnosis not present

## 2020-02-22 DIAGNOSIS — R5383 Other fatigue: Secondary | ICD-10-CM | POA: Diagnosis not present

## 2020-02-22 DIAGNOSIS — R5381 Other malaise: Secondary | ICD-10-CM | POA: Diagnosis not present

## 2020-03-14 DIAGNOSIS — Z85828 Personal history of other malignant neoplasm of skin: Secondary | ICD-10-CM | POA: Diagnosis not present

## 2020-03-14 DIAGNOSIS — C44619 Basal cell carcinoma of skin of left upper limb, including shoulder: Secondary | ICD-10-CM | POA: Diagnosis not present

## 2020-03-14 DIAGNOSIS — D225 Melanocytic nevi of trunk: Secondary | ICD-10-CM | POA: Diagnosis not present

## 2020-03-14 DIAGNOSIS — L821 Other seborrheic keratosis: Secondary | ICD-10-CM | POA: Diagnosis not present

## 2020-03-14 DIAGNOSIS — L570 Actinic keratosis: Secondary | ICD-10-CM | POA: Diagnosis not present

## 2020-03-14 DIAGNOSIS — L814 Other melanin hyperpigmentation: Secondary | ICD-10-CM | POA: Diagnosis not present

## 2020-06-03 ENCOUNTER — Other Ambulatory Visit: Payer: Self-pay | Admitting: Cardiovascular Disease

## 2020-06-03 ENCOUNTER — Encounter: Payer: Self-pay | Admitting: Obstetrics and Gynecology

## 2020-06-03 DIAGNOSIS — M8589 Other specified disorders of bone density and structure, multiple sites: Secondary | ICD-10-CM | POA: Diagnosis not present

## 2020-06-03 DIAGNOSIS — R928 Other abnormal and inconclusive findings on diagnostic imaging of breast: Secondary | ICD-10-CM | POA: Diagnosis not present

## 2020-06-03 DIAGNOSIS — M810 Age-related osteoporosis without current pathological fracture: Secondary | ICD-10-CM | POA: Diagnosis not present

## 2020-06-03 MED ORDER — APIXABAN 2.5 MG PO TABS: 2.5000 mg | ORAL_TABLET | Freq: Two times a day (BID) | ORAL | 1 refills | Status: DC

## 2020-06-03 NOTE — Telephone Encounter (Signed)
Age 79, weight 56kg, SCr 0.75 on 12/31/19, last OV April 2021, afib indication. Refill sent in.

## 2020-06-03 NOTE — Telephone Encounter (Signed)
*  STAT* If patient is at the pharmacy, call can be transferred to refill team.   1. Which medications need to be refilled? (please list name of each medication and dose if known) apixaban (ELIQUIS) 2.5 MG TABS tablet; rosuvastatin (CRESTOR) 5 MG tablet  2. Which pharmacy/location (including street and city if local pharmacy) is medication to be sent to? [facility] [place], [place]  3. Do they need a 30 day or 90 day supply? 90 day for both

## 2020-06-04 ENCOUNTER — Encounter: Payer: Self-pay | Admitting: Gynecology

## 2020-06-06 DIAGNOSIS — H6123 Impacted cerumen, bilateral: Secondary | ICD-10-CM | POA: Diagnosis not present

## 2020-06-06 DIAGNOSIS — H61303 Acquired stenosis of external ear canal, unspecified, bilateral: Secondary | ICD-10-CM | POA: Diagnosis not present

## 2020-06-19 ENCOUNTER — Telehealth: Payer: Self-pay | Admitting: Cardiovascular Disease

## 2020-06-19 NOTE — Telephone Encounter (Signed)
After speaking with Pre OP provider today Melina Copa, PAC I s/w the pt's daughter Helene Kelp and went over recommendations. Ok per [employer] pt can take her medications as usual as it was only 1 tooth that was removed. Blood thinners would not need to be held for 1 tooth generally and does not need to be held afterward. I did advise the pt's daughter that if there is significant bleeding then they will need to call the dental office. Explained if the dental office needs to s/w cardiologist they will call our office. Pt's daughter thanked me for the call and the help our office has given.

## 2020-06-19 NOTE — Telephone Encounter (Signed)
Pt c/o medication issue:  1. Name of Medication:  apixaban (ELIQUIS) 2.5 MG TABS tablet clopidogrel (PLAVIX) 75 MG tablet  2. How are you currently taking this medication (dosage and times per day)? As directed     3. Are you having a reaction (difficulty breathing--STAT)? no  4. What is your medication issue? Pt had an emergency tooth extraction today. She did not have to stop taking her medicine but wants to know if she should keep taking her medication normally or make changes

## 2020-06-19 NOTE — Telephone Encounter (Signed)
   Primary Cardiologist: Jenkins Rouge, MD  Chart reviewed as part of pre-operative protocol coverage. Per chart review patient has history of CAD s/p MI/PCI, HLD, GERD, hiatal hernia, angiodysplasia of colon, PAF, HTN amongst diagnosis.  It is generally accepted that for simple extractions of 1-2 teeth, there is no need to interrupt blood thinner therapy (before or after procedure). Therefore we would not typically advise any interruption in her blood thinner medication. However, would recommend the patient check with her dentist to make sure they were aware she is on these medicines in case they need to advise local hemostatic measures and to contact them for any bleeding issues.  Also reviewed chart for completeness for SBE ppx even though she's now post-procedure - SBE prophylaxis is not required for the patient from a cardiac standpoint.  I will route this recommendation back to callback who will call patient.   Charlie Pitter, PA-C 06/19/2020, 3:54 PM

## 2020-08-08 NOTE — Progress Notes (Signed)
Evaluation Performed:  Follow-up visit  Date:  08/20/2020   ID:  Sarah Ray, DOB 02/20/1941, MRN [id number]  Provider Location: Office  PCP:  Raina Mina., MD  Cardiologist:  Jenkins Rouge, MD   Electrophysiologist:  None   Chief Complaint:  CAD/HLD /PAF   History of Present Illness:    79 y/o female with a history of CAD, s/p MI-RCA PCI June 2015. Cath done Jan 2018 showed patent RCA sites and non obstructive residual CAD. She was  admitted 05/04/18 with chest pain felt to be non cardiac. Intolerant to multiple statins with LDL above goal. Seen by lipid clinic and tried on Praluent.. This was stopped due to dizziness and flu like symptoms  Intolerant to Nexlitol with back pain Did not want to be enrolled in [place] trial She has known GERD plavix held without problems for EGD done 06/19/18 small polyps, small hiatal hernia and small area of angiodysplasia in duodenum     Having paresthesias in legs coreg changed to lopressor and zetia held Also had burning feeling with praluent LDL was 87 09/04/18   She has really failed all attempts at meds for her LDL She has been off everything    LDL 84 on 01/16/20   New onset PAF noted in cardiac rehab on 12/27/19 Asymptomatic with elevated HR Toprol dose increased to 25 ,g daily and Cardizem 120 mg added Anticoagulation started with eliquis Had 4 days diarrhea and Cardizem d/c CHADVASC 5 PAF noted in relation to COVID vaccine When seen in Afib clinic 12/31/19 was in NSR  She is allergic to ASA and has been on low dose eliquis and plavix given her CAD   She never knew she was in afib   Discussed not getting booster since she had issues wit 2nd shot unless there is a new variant or outbreak in [place]    Past Medical History:  Diagnosis Date  . Adenomatous polyps 07/1997  . Breast fibroadenoma   . CAD (coronary artery disease) 03/18/14   STEMI- stent to RCA and nonobstructive disease LAD and LCX  . External hemorrhoids   . GERD (gastroesophageal  reflux disease)    LA Class Grade C Erosive esophagitis  . Hiatal hernia   . HTN (hypertension)   . Hyperlipidemia   . Internal hemorrhoids   . Osteopenia 04/2018   T score -1.9 FRAX 21% / 5%  . Peptic stricture of esophagus   . Skin cancer 2014   "forehead"  . ST elevation myocardial infarction (STEMI) involving right coronary artery in recovery phase ([place]) 03/18/14   Past Surgical History:  Procedure Laterality Date  . ABDOMINAL HYSTERECTOMY  1977   Leiomyoma  . BREAST CYST ASPIRATION Right ~ 2011  . BUNIONECTOMY Bilateral 04/04/2013  . CARDIAC CATHETERIZATION N/A 09/29/2016   Procedure: Left Heart Cath and Coronary Angiography;  Surgeon: Belva Crome, MD;  Location: [facility];  Service: Cardiovascular;  Laterality: N/A;  . CORONARY ANGIOPLASTY WITH STENT PLACEMENT  03/18/14   Xience DES to totally occl RCA, residual non obstructive disease to LAD and LCX  . ESOPHAGOGASTRODUODENOSCOPY (EGD) WITH ESOPHAGEAL DILATION  2000's X 1  . LEFT HEART CATH N/A 03/18/2014   Procedure: LEFT HEART CATH;  Surgeon: Blane Ohara, MD;  Location: [facility] CATH LAB;  Service: Cardiovascular;  Laterality: N/A;  . LEFT HEART CATHETERIZATION WITH CORONARY ANGIOGRAM N/A 04/22/2014   Procedure: LEFT HEART CATHETERIZATION WITH CORONARY ANGIOGRAM;  Surgeon: Blane Ohara, MD;  Location:  [facility];  Service: Cardiovascular;  Laterality: N/A;  . SKIN CANCER EXCISION  2014  . THYROID CYST EXCISION  1970's  . TONSILLECTOMY AND ADENOIDECTOMY  ~ 1950     Current Meds  Medication Sig  . apixaban (ELIQUIS) 2.5 MG TABS tablet Take 1 tablet (2.5 mg total) by mouth 2 (two) times daily.  Marland Kitchen CALCIUM-VITAMIN D PO Take 1 tablet by mouth 2 (two) times daily.   . clopidogrel (PLAVIX) 75 MG tablet TAKE 1 TABLET(75 MG) BY MOUTH AT BEDTIME  . famotidine (PEPCID) 20 MG tablet Take 20 mg by mouth daily.  . metoprolol succinate (TOPROL-XL) 25 MG 24 hr tablet Take 1 tablet (25 mg total) by mouth daily. Take with or  immediately following a meal.  . nitroGLYCERIN (NITROSTAT) 0.4 MG SL tablet Place 1 tablet (0.4 mg total) under the tongue every 5 (five) minutes as needed for chest pain.  . pantoprazole (PROTONIX) 40 MG tablet Take 40 mg by mouth daily.  . rosuvastatin (CRESTOR) 5 MG tablet Take 1 tablet by mouth 5 days a week to daily as tolerated  . [DISCONTINUED] pantoprazole (PROTONIX) 40 MG tablet TAKE 1 TABLET(40 MG) BY MOUTH TWICE DAILY     Allergies:   Naprosyn [naproxen], Ticagrelor, Aspirin, Atorvastatin, Other, Bempedoic acid, Gabapentin, and Praluent [alirocumab]   Social History   Tobacco Use  . Smoking status: Never Smoker  . Smokeless tobacco: Never Used  Vaping Use  . Vaping Use: Never used  Substance Use Topics  . Alcohol use: No    Alcohol/week: 0.0 standard drinks  . Drug use: No     Family Hx: The patient's family history includes Breast cancer in her daughter and maternal aunt; Colon cancer in her maternal aunt; Colon polyps in her mother; Heart disease in her brother. There is no history of Esophageal cancer.  ROS:   Please see the history of present illness.     All other systems reviewed and are negative.   Prior CV studies:   The following studies were reviewed today:  Echo 05/04/18 EF normal trivial AR Cath 09/29/16  40-50% distal RCA 40-50% proximal LAD 50-70% D2 and 40% mid circumflex EF normal    Labs/Other Tests and Data Reviewed:    EKG: 05/23/18 SR rate 68 normal    Recent Labs: 12/31/2019: BUN 10; Creatinine, Ser 0.75; Hemoglobin 13.4; Platelets 200; Potassium 4.6; Sodium 139; TSH 1.479 01/16/2020: ALT 12   Recent Lipid Panel Lab Results  Component Value Date/Time   CHOL 158 01/16/2020 09:08 AM   TRIG 105 01/16/2020 09:08 AM   HDL 55 01/16/2020 09:08 AM   CHOLHDL 2.9 01/16/2020 09:08 AM   CHOLHDL 2.8 06/17/2016 08:48 AM   LDLCALC 84 01/16/2020 09:08 AM   LDLDIRECT 112 (H) 06/15/2018 11:39 AM    Wt Readings from Last 3 Encounters:  08/20/20 58.4  kg  01/16/20 56.2 kg  12/31/19 57.5 kg     Objective:    Vital Signs:  BP 140/72   Pulse (!) 56   Ht 5\' 1"  (1.549 m)   Wt 58.4 kg   SpO2 99%   BMI 24.34 kg/m    Affect appropriate Healthy:  appears stated age HEENT: normal Neck supple with no adenopathy JVP normal no bruits no thyromegaly Lungs clear with no wheezing and good diaphragmatic motion Heart:  S1/S2 no murmur, no rub, gallop or click PMI normal Abdomen: benighn, BS positve, no tenderness, no AAA no bruit.  No HSM or HJR Distal pulses intact  with no bruits No edema Neuro non-focal Skin warm and dry No muscular weakness   ASSESSMENT & PLAN:    1. CAD:  Moderate by cath 09/29/16 no angina observe Patent RCA stent  2. HLD:  Intolerant to statins, praluent and zetia and Nexlitol observe 3. GERD:  On protonix f/u primary EGD 06/19/18 benign f/u Dr Fuller Plan most of her symptoms are post prandial and at night  4. PAF:  Temporally related to COVID vaccine CHADVASC 5 on low dose eliquis due to age/weight Continue Toprol NSR today Cardizem d/c due to diarrhea   COVID-19 Education:   Medication Adjustments/Labs and Tests Ordered: Current medicines are reviewed at length with the patient today.  Concerns regarding medicines are outlined above.  Tests Ordered:  None   Medication Changes:  None   Disposition:  Follow up in 6 months   Signed, Jenkins Rouge, MD  08/20/2020 11:01 AM    [place]

## 2020-08-20 ENCOUNTER — Encounter: Payer: Self-pay | Admitting: Cardiovascular Disease

## 2020-08-20 ENCOUNTER — Ambulatory Visit: Payer: PPO | Admitting: Cardiovascular Disease

## 2020-08-20 ENCOUNTER — Other Ambulatory Visit: Payer: Self-pay

## 2020-08-20 VITALS — BP 140/72 | HR 56 | Ht 61.0 in | Wt 128.8 lb

## 2020-08-20 DIAGNOSIS — I700 Atherosclerosis of aorta: Secondary | ICD-10-CM | POA: Diagnosis not present

## 2020-08-20 DIAGNOSIS — Z0000 Encounter for general adult medical examination without abnormal findings: Secondary | ICD-10-CM | POA: Diagnosis not present

## 2020-08-20 DIAGNOSIS — Z79899 Other long term (current) drug therapy: Secondary | ICD-10-CM | POA: Diagnosis not present

## 2020-08-20 DIAGNOSIS — I10 Essential (primary) hypertension: Secondary | ICD-10-CM | POA: Diagnosis not present

## 2020-08-20 DIAGNOSIS — I480 Paroxysmal atrial fibrillation: Secondary | ICD-10-CM

## 2020-08-20 DIAGNOSIS — I2510 Atherosclerotic heart disease of native coronary artery without angina pectoris: Secondary | ICD-10-CM

## 2020-08-20 DIAGNOSIS — F411 Generalized anxiety disorder: Secondary | ICD-10-CM | POA: Diagnosis not present

## 2020-08-20 DIAGNOSIS — E782 Mixed hyperlipidemia: Secondary | ICD-10-CM | POA: Diagnosis not present

## 2020-08-20 DIAGNOSIS — Z86010 Personal history of colonic polyps: Secondary | ICD-10-CM | POA: Diagnosis not present

## 2020-08-20 DIAGNOSIS — R5381 Other malaise: Secondary | ICD-10-CM | POA: Diagnosis not present

## 2020-08-20 DIAGNOSIS — E041 Nontoxic single thyroid nodule: Secondary | ICD-10-CM | POA: Diagnosis not present

## 2020-08-20 DIAGNOSIS — K219 Gastro-esophageal reflux disease without esophagitis: Secondary | ICD-10-CM | POA: Diagnosis not present

## 2020-08-20 NOTE — Patient Instructions (Signed)

## 2020-08-25 DIAGNOSIS — Z7902 Long term (current) use of antithrombotics/antiplatelets: Secondary | ICD-10-CM | POA: Diagnosis not present

## 2020-08-25 DIAGNOSIS — Z974 Presence of external hearing-aid: Secondary | ICD-10-CM | POA: Diagnosis not present

## 2020-08-25 DIAGNOSIS — Z130 Encounter for screening for diseases of the blood and blood-forming organs and certain disorders involving the immune mechanism: Secondary | ICD-10-CM | POA: Diagnosis not present

## 2020-08-25 DIAGNOSIS — E041 Nontoxic single thyroid nodule: Secondary | ICD-10-CM | POA: Diagnosis not present

## 2020-08-25 DIAGNOSIS — Z86010 Personal history of colonic polyps: Secondary | ICD-10-CM | POA: Diagnosis not present

## 2020-08-25 DIAGNOSIS — I480 Paroxysmal atrial fibrillation: Secondary | ICD-10-CM | POA: Diagnosis not present

## 2020-08-25 DIAGNOSIS — E782 Mixed hyperlipidemia: Secondary | ICD-10-CM | POA: Diagnosis not present

## 2020-08-25 DIAGNOSIS — I2510 Atherosclerotic heart disease of native coronary artery without angina pectoris: Secondary | ICD-10-CM | POA: Diagnosis not present

## 2020-08-25 DIAGNOSIS — I10 Essential (primary) hypertension: Secondary | ICD-10-CM | POA: Diagnosis not present

## 2020-08-25 DIAGNOSIS — Z79899 Other long term (current) drug therapy: Secondary | ICD-10-CM | POA: Diagnosis not present

## 2020-08-25 DIAGNOSIS — Z0001 Encounter for general adult medical examination with abnormal findings: Secondary | ICD-10-CM | POA: Diagnosis not present

## 2020-08-25 DIAGNOSIS — F411 Generalized anxiety disorder: Secondary | ICD-10-CM | POA: Diagnosis not present

## 2020-08-25 DIAGNOSIS — M791 Myalgia, unspecified site: Secondary | ICD-10-CM | POA: Diagnosis not present

## 2020-08-25 DIAGNOSIS — R5381 Other malaise: Secondary | ICD-10-CM | POA: Diagnosis not present

## 2020-08-25 DIAGNOSIS — D6869 Other thrombophilia: Secondary | ICD-10-CM | POA: Diagnosis not present

## 2020-08-25 DIAGNOSIS — Z833 Family history of diabetes mellitus: Secondary | ICD-10-CM | POA: Diagnosis not present

## 2020-08-25 DIAGNOSIS — K219 Gastro-esophageal reflux disease without esophagitis: Secondary | ICD-10-CM | POA: Diagnosis not present

## 2020-08-25 DIAGNOSIS — Z789 Other specified health status: Secondary | ICD-10-CM | POA: Diagnosis not present

## 2020-08-25 DIAGNOSIS — I700 Atherosclerosis of aorta: Secondary | ICD-10-CM | POA: Diagnosis not present

## 2020-08-25 DIAGNOSIS — R5383 Other fatigue: Secondary | ICD-10-CM | POA: Diagnosis not present

## 2020-10-14 DIAGNOSIS — H10503 Unspecified blepharoconjunctivitis, bilateral: Secondary | ICD-10-CM | POA: Diagnosis not present

## 2020-11-08 ENCOUNTER — Other Ambulatory Visit: Payer: Self-pay | Admitting: Cardiovascular Disease

## 2020-11-09 IMAGING — NM NUCLEAR MEDICINE HEPATOBILIARY IMAGING WITH GALLBLADDER EF
2 series · 12 of 12 positions shown · non-contrast
Comparison: None.

CLINICAL DATA: Pain.

EXAM:
NUCLEAR MEDICINE HEPATOBILIARY IMAGING WITH GALLBLADDER EF
TECHNIQUE: Sequential images of the abdomen were obtained [DATE] minutes
following intravenous administration of radiopharmaceutical. After
oral ingestion of Ensure, gallbladder ejection fraction was
determined. At 60 min, normal ejection fraction is greater than 33%.
RADIOPHARMACEUTICALS:  5.2 mCi Wc-99m  Choletec IV

[Series 1: raw data · 4.46mm/px · 6 of 60 frames shown (1 of 2)]
[frame 6/60]
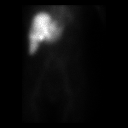
[frame 16/60]
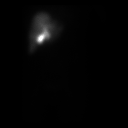
[frame 26/60]
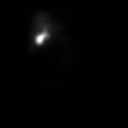
[frame 36/60]
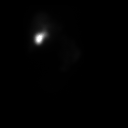
[frame 46/60]
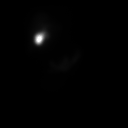
[frame 56/60]
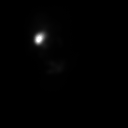

[Series 1: raw data · 4.46mm/px · 6 of 60 frames shown (2 of 2)]
[frame 6/60]
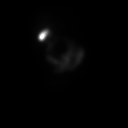
[frame 16/60]
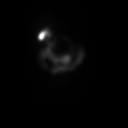
[frame 26/60]
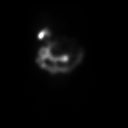
[frame 36/60]
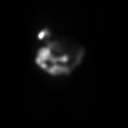
[frame 46/60]
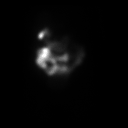
[frame 56/60]
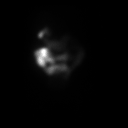

[12 of 12 positions shown; findings below may reference images not displayed]

FINDINGS: Prompt uptake and biliary excretion of activity by the liver is
seen. Gallbladder activity is visualized, consistent with patency of
cystic duct. Biliary activity passes into small bowel, consistent
with patent common bile duct.

Calculated gallbladder ejection fraction is 86%. (Normal gallbladder
ejection fraction with Ensure is greater than 33%.)
IMPRESSION: Normal study.

## 2020-11-18 ENCOUNTER — Other Ambulatory Visit: Payer: Self-pay | Admitting: Cardiovascular Disease

## 2020-12-07 ENCOUNTER — Other Ambulatory Visit: Payer: Self-pay | Admitting: Cardiovascular Disease

## 2020-12-08 NOTE — Telephone Encounter (Signed)
Eliquis 2.5mg  refill request received. Patient is 80 years old, weight-58.4kg, Crea-0.75 on 12/31/2019, Diagnosis-Afib, and last seen by Dr. Johnsie Cancel on 08/20/2020. Dose is appropriate based on dosing criteria. Will send in refill to requested pharmacy.

## 2020-12-16 ENCOUNTER — Other Ambulatory Visit: Payer: Self-pay | Admitting: Cardiovascular Disease

## 2021-02-23 ENCOUNTER — Telehealth: Payer: Self-pay | Admitting: Gastroenterology

## 2021-02-23 MED ORDER — PANTOPRAZOLE SODIUM 40 MG PO TBEC: 40.0000 mg | DELAYED_RELEASE_TABLET | Freq: Every day | ORAL | 1 refills | Status: DC

## 2021-02-23 NOTE — Telephone Encounter (Signed)
Inbound call from patient need medication refill for pantoprazole 40mg  for 90 days. [facility] pharmacy in [place] on [address]. Have an appointment with Dr. Fuller Plan for 03/13/21.

## 2021-02-23 NOTE — Telephone Encounter (Signed)
Called patient to verify how she is taking pantoprazole 40mg .  Patient states she is only taking it once daily. Rx sent to pharmacy.

## 2021-03-03 NOTE — Progress Notes (Signed)
Evaluation Performed:  Follow-up visit  Date:  03/10/2021   ID:  Sarah Ray, DOB 12/26/1940, MRN [id number]  Provider Location: Office  PCP:  Raina Mina., MD  Cardiologist:  Jenkins Rouge, MD   Electrophysiologist:  None   Chief Complaint:  CAD/HLD /PAF   History of Present Illness:    80 y/o female with a history of CAD, s/p MI-RCA PCI June 2015. Cath done Jan 2018 showed patent RCA sites and non obstructive residual CAD. She was  admitted 05/04/18 with chest pain felt to be non cardiac. Intolerant to multiple statins with LDL above goal. Seen by lipid clinic and tried on Praluent.. This was stopped due to dizziness and flu like symptoms  Intolerant to Nexlitol with back pain Did not want to be enrolled in [place] trial She has known GERD plavix held without problems for EGD done 06/19/18 small polyps, small hiatal hernia and small area of angiodysplasia in duodenum     Having paresthesias in legs coreg changed to lopressor and zetia held Also had burning feeling with praluent LDL was 87 09/04/18   She has really failed all attempts at meds for her LDL She has been off everything    LDL 84 on 01/16/20   New onset PAF noted in cardiac rehab on 12/27/19 Asymptomatic with elevated HR Toprol dose increased to 25 ,g daily and Cardizem 120 mg added Anticoagulation started with eliquis Had 4 days diarrhea and Cardizem d/c CHADVASC 5 PAF noted in relation to COVID vaccine When seen in Afib clinic 12/31/19 was in NSR  She is allergic to ASA and has been on low dose eliquis and plavix given her CAD   She never knew she was in afib   Discussed not getting booster since she had issues wit 2nd shot unless there is a new variant or outbreak in [place]   She really wants to come off eliquis. 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   Breast fibroadenoma    CAD (coronary  artery disease) 03/18/14   STEMI- stent to RCA and nonobstructive disease LAD and LCX   External hemorrhoids    GERD (gastroesophageal reflux disease)    LA Class Grade C Erosive esophagitis   Hiatal hernia    HTN (hypertension)    Hyperlipidemia    Internal hemorrhoids    Osteopenia 04/2018   T score -1.9 FRAX 21% / 5%   Peptic stricture of esophagus    Skin cancer 2014   "forehead"   ST elevation myocardial infarction (STEMI) involving right coronary artery in recovery phase ([place]) 03/18/14   Past Surgical History:  Procedure Laterality Date   ABDOMINAL HYSTERECTOMY  1977   Leiomyoma   BREAST CYST ASPIRATION Right ~ 2011   BUNIONECTOMY Bilateral 04/04/2013   CARDIAC CATHETERIZATION N/A 09/29/2016   Procedure: Left Heart Cath and Coronary Angiography;  Surgeon: Belva Crome, MD;  Location: [facility];  Service: Cardiovascular;  Laterality: N/A;   CORONARY ANGIOPLASTY WITH STENT PLACEMENT  03/18/14   Xience DES to totally occl RCA, residual non obstructive disease to LAD and LCX   ESOPHAGOGASTRODUODENOSCOPY (EGD) WITH ESOPHAGEAL DILATION  2000's X 1   LEFT HEART CATH N/A 03/18/2014   Procedure: LEFT HEART CATH;  Surgeon: Blane Ohara, MD;  Location: [facility] C[facility];  Service: Cardiovascular;  Laterality: N/A;  LEFT HEART CATHETERIZATION WITH CORONARY ANGIOGRAM N/A 04/22/2014   Procedure: LEFT HEART CATHETERIZATION WITH CORONARY ANGIOGRAM;  Surgeon: Blane Ohara, MD;  Location: [facility] CATH LAB;  Service: Cardiovascular;  Laterality: N/A;   SKIN CANCER EXCISION  2014   THYROID CYST EXCISION  1970's   TONSILLECTOMY AND ADENOIDECTOMY  ~ 1950     No outpatient medications have been marked as taking for the 03/10/21 encounter (Appointment) with [name], MD.     Allergies:   Naprosyn [naproxen], Ticagrelor, Aspirin, Atorvastatin, Other, Bempedoic acid, Gabapentin, and Praluent [alirocumab]   Social History   Tobacco Use   Smoking status: Never   Smokeless tobacco: Never   Vaping Use   Vaping Use: Never used  Substance Use Topics   Alcohol use: No    Alcohol/week: 0.0 standard drinks   Drug use: No     Family Hx: The patient's family history includes Breast cancer in her daughter and maternal aunt; Colon cancer in her maternal aunt; Colon polyps in her mother; Heart disease in her brother. There is no history of Esophageal cancer.  ROS:   Please see the history of present illness.     All other systems reviewed and are negative.   Prior CV studies:   The following studies were reviewed today:  Echo 05/04/18 EF normal trivial AR Cath 09/29/16  40-50% distal RCA 40-50% proximal LAD 50-70% D2 and 40% mid circumflex EF normal    Labs/Other Tests and Data Reviewed:    EKG: 03/10/2021 SR rate 64 normal    Recent Labs: No results found for requested labs within last 8760 hours.   Recent Lipid Panel Lab Results  Component Value Date/Time   CHOL 158 01/16/2020 09:08 AM   TRIG 105 01/16/2020 09:08 AM   HDL 55 01/16/2020 09:08 AM   CHOLHDL 2.9 01/16/2020 09:08 AM   CHOLHDL 2.8 06/17/2016 08:48 AM   LDLCALC 84 01/16/2020 09:08 AM   LDLDIRECT 112 (H) 06/15/2018 11:39 AM    Wt Readings from Last 3 Encounters:  08/20/20 58.4 kg  01/16/20 56.2 kg  12/31/19 57.5 kg     Objective:    Vital Signs:  There were no vitals taken for this visit.   Affect appropriate Healthy:  appears stated age 63: normal Neck supple with no adenopathy JVP normal no bruits no thyromegaly Lungs clear with no wheezing and good diaphragmatic motion Heart:  S1/S2 no murmur, no rub, gallop or click PMI normal Abdomen: benighn, BS positve, no tenderness, no AAA no bruit.  No HSM or HJR Distal pulses intact with no bruits No edema Neuro non-focal Skin warm and dry No muscular weakness   ASSESSMENT & PLAN:    CAD:  Moderate by cath 09/29/16 no angina observe Patent RCA stent  HLD:  Intolerant to statins, praluent and zetia and Nexlitol observe GERD:  On  protonix f/u primary EGD 06/19/18 benign f/u Dr Fuller Plan most of her symptoms are post prandial and at night  PAF:  Temporally related to COVID vaccine CHADVASC 5 on low dose eliquis due to age/weight She prefers to stop anticoagulation and will monitor for irregular pulse closely   COVID-19 Education:   Medication Adjustments/Labs and Tests Ordered: Current medicines are reviewed at length with the patient today.  Concerns regarding medicines are outlined above.  Tests Ordered:  None   Medication Changes:  D/C eliquis   Disposition:  Follow up in 6 months   Signed, Jenkins Rouge, MD  03/10/2021 10:12 AM  [facility]

## 2021-03-10 ENCOUNTER — Other Ambulatory Visit: Payer: Self-pay

## 2021-03-10 ENCOUNTER — Encounter: Payer: Self-pay | Admitting: Cardiovascular Disease

## 2021-03-10 ENCOUNTER — Ambulatory Visit: Payer: PPO | Admitting: Cardiovascular Disease

## 2021-03-10 VITALS — BP 130/86 | Ht 61.0 in | Wt 139.0 lb

## 2021-03-10 DIAGNOSIS — I480 Paroxysmal atrial fibrillation: Secondary | ICD-10-CM

## 2021-03-10 DIAGNOSIS — I2510 Atherosclerotic heart disease of native coronary artery without angina pectoris: Secondary | ICD-10-CM | POA: Diagnosis not present

## 2021-03-10 DIAGNOSIS — E782 Mixed hyperlipidemia: Secondary | ICD-10-CM

## 2021-03-10 NOTE — Patient Instructions (Signed)

## 2021-03-13 ENCOUNTER — Ambulatory Visit: Payer: PPO | Admitting: Gastroenterology

## 2021-03-13 ENCOUNTER — Encounter: Payer: Self-pay | Admitting: Gastroenterology

## 2021-03-13 VITALS — BP 130/64 | HR 56 | Ht 61.0 in | Wt 138.0 lb

## 2021-03-13 DIAGNOSIS — K219 Gastro-esophageal reflux disease without esophagitis: Secondary | ICD-10-CM

## 2021-03-13 DIAGNOSIS — K5520 Angiodysplasia of colon without hemorrhage: Secondary | ICD-10-CM

## 2021-03-13 DIAGNOSIS — Z86010 Personal history of colonic polyps: Secondary | ICD-10-CM | POA: Diagnosis not present

## 2021-03-13 MED ORDER — PANTOPRAZOLE SODIUM 40 MG PO TBEC: 40.0000 mg | DELAYED_RELEASE_TABLET | Freq: Every day | ORAL | 3 refills | Status: DC

## 2021-03-13 NOTE — Patient Instructions (Signed)
We have sent the following medications to your pharmacy for you to pick up at your convenience: pantoprazole.  Due to recent changes in healthcare laws, you may see the results of your imaging and laboratory studies on MyChart before your provider has had a chance to review them.  We understand that in some cases there may be results that are confusing or concerning to you. Not all laboratory results come back in the same time frame and the provider may be waiting for multiple results in order to interpret others.  Please give [place] 48 hours in order for your provider to thoroughly review all the results before contacting the office for clarification of your results.   Normal BMI (Body Mass Index- based on height and weight) is between 23 and 30. Your BMI today is Body mass index is 26.07 kg/m. Marland Kitchen Please consider follow up  regarding your BMI with your Primary Care Provider.  Thank you for choosing me and [facility].  [name]. Dagoberto Ligas., MD., [facility] Regal

## 2021-03-13 NOTE — Progress Notes (Signed)
    History of Present Illness: This is an 80 year old female returning for follow-up of GERD.  EGD performed in September 2019 showed a small hiatal hernia, benign gastric fundic polyps and a duodenal AVM.  Her reflux symptoms are under very good control on her current regimen.  She has no gastrointestinal complaints.  Current Medications, Allergies, Past Medical History, Past Surgical History, Family History and Social History were reviewed in [employer] record.   Physical Exam: General: Well developed, well nourished, no acute distress Head: Normocephalic and atraumatic Eyes: Sclerae anicteric, EOMI Ears: Normal auditory acuity Mouth: Not examined, mask on during Covid-19 pandemic Lungs: Clear throughout to auscultation Heart: Regular rate and rhythm; no murmurs, rubs or bruits Abdomen: Soft, non tender and non distended. No masses, hepatosplenomegaly or hernias noted. Normal Bowel sounds Rectal: Not done Musculoskeletal: Symmetrical with no gross deformities  Pulses:  Normal pulses noted Extremities: No clubbing, cyanosis, edema or deformities noted Neurological: Alert oriented x 4, grossly nonfocal Psychological:  Alert and cooperative. Normal mood and affect   Assessment and Recommendations:  GERD.  Follow antireflux measures and continue pantoprazole 40 mg p.o.daily and famotidine 20 mg at bedtime. REV in 1 year.   2.  Personal history of adenomatous colon polyps.  Colonoscopy in August 2020 showed diverticulosis and 2 colon polyps.  One was adenomatous and one was hyperplastic.  Due to her age no future surveillance colonoscopies are planned.  3.  Duodenal AVM.

## 2021-03-18 DIAGNOSIS — L570 Actinic keratosis: Secondary | ICD-10-CM | POA: Diagnosis not present

## 2021-03-18 DIAGNOSIS — L814 Other melanin hyperpigmentation: Secondary | ICD-10-CM | POA: Diagnosis not present

## 2021-03-18 DIAGNOSIS — L820 Inflamed seborrheic keratosis: Secondary | ICD-10-CM | POA: Diagnosis not present

## 2021-03-18 DIAGNOSIS — Z85828 Personal history of other malignant neoplasm of skin: Secondary | ICD-10-CM | POA: Diagnosis not present

## 2021-03-18 DIAGNOSIS — L821 Other seborrheic keratosis: Secondary | ICD-10-CM | POA: Diagnosis not present

## 2021-03-27 DIAGNOSIS — H52203 Unspecified astigmatism, bilateral: Secondary | ICD-10-CM | POA: Diagnosis not present

## 2021-03-27 DIAGNOSIS — D3132 Benign neoplasm of left choroid: Secondary | ICD-10-CM | POA: Diagnosis not present

## 2021-03-27 DIAGNOSIS — H0100A Unspecified blepharitis right eye, upper and lower eyelids: Secondary | ICD-10-CM | POA: Diagnosis not present

## 2021-03-27 DIAGNOSIS — Z961 Presence of intraocular lens: Secondary | ICD-10-CM | POA: Diagnosis not present

## 2021-06-04 DIAGNOSIS — Z1231 Encounter for screening mammogram for malignant neoplasm of breast: Secondary | ICD-10-CM | POA: Diagnosis not present

## 2021-06-17 ENCOUNTER — Ambulatory Visit (INDEPENDENT_AMBULATORY_CARE_PROVIDER_SITE_OTHER): Payer: PPO

## 2021-06-17 ENCOUNTER — Ambulatory Visit: Payer: PPO | Admitting: Podiatrist

## 2021-06-17 ENCOUNTER — Other Ambulatory Visit: Payer: Self-pay

## 2021-06-17 ENCOUNTER — Encounter: Payer: Self-pay | Admitting: Podiatrist

## 2021-06-17 ENCOUNTER — Other Ambulatory Visit: Payer: Self-pay | Admitting: Podiatrist

## 2021-06-17 DIAGNOSIS — S86892A Other injury of other muscle(s) and tendon(s) at lower leg level, left leg, initial encounter: Secondary | ICD-10-CM | POA: Diagnosis not present

## 2021-06-17 DIAGNOSIS — M778 Other enthesopathies, not elsewhere classified: Secondary | ICD-10-CM | POA: Diagnosis not present

## 2021-06-17 DIAGNOSIS — M79672 Pain in left foot: Secondary | ICD-10-CM

## 2021-06-17 NOTE — Patient Instructions (Addendum)
Pick up a compression stocking (15-29mmhg)  or a calf compression sleeve to wear on your left leg.  I will call in a prescription for physical therapy at [facility].   Shin Splints Shin splints is a painful condition that is felt either on the bone that is located in the front of the lower leg (tibia or shin bone) or in the muscles on either side of the bone. This condition happens when physical activities lead to inflammation of the muscles, tendons, and the thin layer of tissue that covers the shin bone. It may result from participating in sports or other intense exercise. What are the causes? This condition may be caused by: Overuse of muscles. Repetitive activities. Flat feet or rigid arches. Activities that could contribute to shin splints include: Having a sudden increase in exercise time. Starting a new, intense activity. Running up hills or long distances. Playing sports that involve sudden starts and stops. Not warming up before activity. Wearing old or worn-out shoes. What are the signs or symptoms? The main symptom of this condition is pain that occurs: On the front of the lower leg. In the muscles on either side of the shin bone. While exercising or at rest. How is this diagnosed? This condition may be diagnosed based on: A physical exam. Your symptoms. An observation of you while you are walking or running. X-rays or other imaging tests. These may be done to rule out other problems. How is this treated? Treatment for this condition depends on your age, history, overall health, and how bad the pain is. Most cases of shin splints can be managed by doing one or more of the following: Resting. Reducing the length and intensity of your exercise. Stopping the activity that causes shin pain. Taking medicines to control the inflammation. Icing, massaging, stretching, and strengthening the affected area. Wearing shoes that have rigid heels, shock absorption,  and a good arch support. For severe shin pain, your health care provider may recommend that you use crutches to avoid putting weight on your legs. Follow these instructions at home: Medicines Take over-the-counter and prescription medicines only as told by your health care provider. Do not drive or use heavy machinery while taking prescription pain medicine. Managing pain, stiffness, and swelling   If directed, apply ice to the painful area. Icing can help to relieve pain and swelling. Put ice in a plastic bag. Place a towel between your skin and the bag. Leave the ice on for 20 minutes, 2-3 times a day. If directed, apply heat to the painful area before stretching exercises, or as told by your health care provider. Heat can help to relax your muscles. Use the heat source that your health care provider recommends, such as a moist heat pack or a heating pad. Place a towel between your skin and the heat source. Leave the heat on for 20-30 minutes. Remove the heat if your skin turns bright red. This is especially important if you are unable to feel pain, heat, or cold. You may have a greater risk of getting burned. Massage, stretch, and strengthen the affected area as directed by your health care provider. Wear compression sleeves or socks as told by your health care provider. Raise (elevate) your legs above the level of your heart while you are sitting or lying down. Activity Rest as needed. Return to activity gradually as told by your health care provider. When you start exercising again, begin with non-weight-bearing exercises, such as cycling or swimming.  Stop running if the pain returns. Warm up properly before exercising. Run on a surface that is level and fairly firm. Gradually change the intensity of an exercise. If you increase your running distance, add only 5-10% to your distance each week. This means that if you are running 5 miles this week, you should only increase your run by  - mile for next week. General instructions Wear shoes that have rigid heels, shock absorption, and a good arch support. Change your athletic shoes every 6 months, or every 350-450 miles. Keep all follow-up visits as told by your health care provider. This is important. Contact a health care provider if: Your symptoms continue, or they worsen even after treatment. The location, intensity, or type of pain changes over time. You have swelling in your lower leg that gets worse. Your shin becomes red and feels warm. Get help right away if: You have severe pain. You have trouble walking. Summary Shin splints happens when physical activities lead to inflammation of the muscles, tendons, and the thin layer of tissue that covers the shin bone. Treatments may include medicines, resting, and icing. Return to activity gradually as directed by your health care provider. Make sure you know what symptoms should cause you to contact your health care provider. This information is not intended to replace advice given to you by your health care provider. Make sure you discuss any questions you have with your health care provider. Document Revised: 09/26/2017 Document Reviewed: 09/26/2017 Elsevier Patient Education  2022 [employer].

## 2021-06-17 NOTE — Progress Notes (Signed)
Chief Complaint  Patient presents with   Foot Pain    Left foot pain associated with edema pain is located at the front her ankle and lateral side of ankle.      HPI: Patient is 80 y.o. female who presents today for pain in the left ankle consisting of swelling along the left ankle and pain in the anterior shin left.  And has been participating actively in cardiac rehab and walks on a treadmill at a incline and related she started having pain in the ankle and in the front of the leg after wearing a new pair shoes during one of her workouts.  She relates the pain continues to occur while exercising.  It also is uncomfortable with normal walking.  She denies any trauma or injury.  Patient Active Problem List   Diagnosis Date Noted   Paroxysmal atrial fibrillation ([place]) 12/31/2019   Secondary hypercoagulable state ([place]) 12/31/2019   Dyslipidemia, goal LDL below 70 05/23/2018   Chest pain, unspecified 05/04/2018   Syncope and collapse    Multiple thyroid nodules 12/10/2015   Lung nodule < 6cm on CT 06/11/2015   Chest pain with moderate risk for cardiac etiology 02/11/2015   HTN (hypertension)    Chest pain 04/22/2014   STEMI (ST elevation myocardial infarction) ([place]) 03/18/2014   UTI (urinary tract infection) 03/18/2014   History of allergy to aspirin- Hives, edema 03/18/2014   Acute MI, inferolateral wall, initial episode of care ([place]) 03/18/2014   Inferolateral myocardial infarction ([place]) 03/18/2014   CAD S/P percutaneous coronary angioplasty 03/18/2014   Osteopenia    Breast fibroadenoma    Hx of adenomatous colonic polyps 11/15/2007   Unspecified disorder of thyroid 11/15/2007   HYPERCHOLESTEROLEMIA 11/15/2007   INTERNAL HEMORRHOIDS 11/15/2007   HEMORRHOIDS, EXTERNAL 11/15/2007   GERD 11/15/2007   PEPTIC STRICTURE 11/15/2007   HIATAL HERNIA 11/15/2007    Current Outpatient Medications on File Prior to Visit  Medication Sig Dispense Refill   CALCIUM-VITAMIN D PO Take 1 tablet  by mouth 2 (two) times daily.      clopidogrel (PLAVIX) 75 MG tablet TAKE 1 TABLET(75 MG) BY MOUTH AT BEDTIME 90 tablet 3   famotidine (PEPCID) 20 MG tablet Take 20 mg by mouth daily.     metoprolol succinate (TOPROL-XL) 25 MG 24 hr tablet TAKE 1 TABLET(25 MG) BY MOUTH DAILY WITH OR IMMEDIATELY FOLLOWING A MEAL 90 tablet 2   nitroGLYCERIN (NITROSTAT) 0.4 MG SL tablet Place 1 tablet (0.4 mg total) under the tongue every 5 (five) minutes as needed for chest pain. 25 tablet 5   pantoprazole (PROTONIX) 40 MG tablet Take 1 tablet (40 mg total) by mouth daily. 90 tablet 3   rosuvastatin (CRESTOR) 5 MG tablet TAKE 1 TABLET BY MOUTH DAILY 5 DAYS A WEEK TO DAILY AS TOLERATED; FREQUENCY CHANGE 30 tablet 9   No current facility-administered medications on file prior to visit.    Allergies  Allergen Reactions   Naprosyn [Naproxen] Hives   Ticagrelor Shortness Of Breath and Other (See Comments)    Caused chest pains Caused chest pains   Aspirin Hives, Diarrhea and Swelling    Benadryl stops reaction   Atorvastatin Other (See Comments)    Myalgias and leg heaviness  Myalgias and leg heaviness   Other Other (See Comments)    Very sensitive to all meds Very sensitive to all meds   Bempedoic Acid     Burning sensation   Gabapentin     dizziness   Praluent [  Alirocumab]     Flu like symptoms    Review of Systems No fevers, chills, nausea, muscle aches, no difficulty breathing, no calf pain, no chest pain or shortness of breath.   Physical Exam  GENERAL APPEARANCE: Alert, conversant. Appropriately groomed. No acute distress.   VASCULAR: Pedal pulses palpable DP and PT bilateral.  Capillary refill time is immediate to all digits,  Proximal to distal cooling it warm to warm.  Digital perfusion adequate.   NEUROLOGIC: sensation is intact to 5.07 monofilament at 5/5 sites bilateral.  Light touch is intact bilateral, vibratory sensation intact bilateral  MUSCULOSKELETAL: acceptable muscle  strength, tone and stability bilateral.  No pain when putting the left ankle through its range of motion.  Anterior drawer sign is negative.  No pain along the sinus tarsi left.  No pain along the lateral or medial ankle ligamentous structures.  Discrete pain along the anterior medial aspect of the tibia and lateral aspect of the left ankle where there is a small fat deposit present.  The right ankle has a small fat deposit on the anterior lateral aspect as well but not as significant.  DERMATOLOGIC: skin is warm, supple, and dry.  No open lesions noted.  No rash, no pre ulcerative lesions. Digital nails are asymptomatic.    X-ray evaluation foot and ankle views left are obtained.  No sign of fracture, stress fracture or joint injury noted along the tibia.  Rectus foot type is seen.  Bunion correction continues to be in good alignment and position.  No acute osseous abnormalities are seen..  Assessment   1. Left medial tibial stress syndrome, initial encounter   2. Capsulitis of left foot      Plan  Exam and x-ray findings are discussed with the patient.  Discussed this appears to be brought on by possibly her shoes as I did look at them and they are very heavy compared to her normal workout shoes.  Because she continues to have the discomfort despite changing shoes I recommended that she see physical therapy for some stretching exercises and other modalities indicated.  I will write to have her seeRehab and  physical therapy in [place] at [facility].  I also dispensed information on shinsplints and recommended she try a compression sleeve or stocking for the left leg especially when doing her cardiac rehab.  She will call if no improvement with physical therapy otherwise will be seen back as needed

## 2021-06-18 ENCOUNTER — Ambulatory Visit: Payer: PPO | Admitting: Podiatry

## 2021-06-29 DIAGNOSIS — M25572 Pain in left ankle and joints of left foot: Secondary | ICD-10-CM | POA: Diagnosis not present

## 2021-06-29 DIAGNOSIS — S86899A Other injury of other muscle(s) and tendon(s) at lower leg level, unspecified leg, initial encounter: Secondary | ICD-10-CM | POA: Diagnosis not present

## 2021-06-29 DIAGNOSIS — M25672 Stiffness of left ankle, not elsewhere classified: Secondary | ICD-10-CM | POA: Diagnosis not present

## 2021-07-21 DIAGNOSIS — M25672 Stiffness of left ankle, not elsewhere classified: Secondary | ICD-10-CM | POA: Diagnosis not present

## 2021-07-21 DIAGNOSIS — M25572 Pain in left ankle and joints of left foot: Secondary | ICD-10-CM | POA: Diagnosis not present

## 2021-07-28 DIAGNOSIS — H61303 Acquired stenosis of external ear canal, unspecified, bilateral: Secondary | ICD-10-CM | POA: Diagnosis not present

## 2021-07-28 DIAGNOSIS — H9193 Unspecified hearing loss, bilateral: Secondary | ICD-10-CM | POA: Diagnosis not present

## 2021-07-28 DIAGNOSIS — H6123 Impacted cerumen, bilateral: Secondary | ICD-10-CM | POA: Diagnosis not present

## 2021-08-05 DIAGNOSIS — Z85828 Personal history of other malignant neoplasm of skin: Secondary | ICD-10-CM | POA: Diagnosis not present

## 2021-08-05 DIAGNOSIS — C44311 Basal cell carcinoma of skin of nose: Secondary | ICD-10-CM | POA: Diagnosis not present

## 2021-08-05 DIAGNOSIS — L720 Epidermal cyst: Secondary | ICD-10-CM | POA: Diagnosis not present

## 2021-08-14 NOTE — Progress Notes (Signed)
Evaluation Performed:  Follow-up visit  Date:  08/21/2021   ID:  Sarah Ray, DOB [birth year], MRN [id number]  Provider Location: Office  PCP:  Raina Mina., MD  Cardiologist:  Jenkins Rouge, MD   Electrophysiologist:  None   Chief Complaint:  CAD/HLD /PAF   History of Present Illness:    80 y/o female with a history of CAD, s/p MI-RCA PCI June 2015. Cath done Jan 2018 showed patent RCA sites and non obstructive residual CAD. She was  admitted 05/04/18 with chest pain felt to be non cardiac. Intolerant to multiple statins with LDL above goal. Seen by lipid clinic and tried on Praluent.. This was stopped due to dizziness and flu like symptoms  Intolerant to Nexlitol with back pain Did not want to be enrolled in [place] trial She has known GERD plavix held without problems for EGD done 06/19/18 small polyps, small hiatal hernia and small area of angiodysplasia in duodenum     Having paresthesias in legs coreg changed to lopressor and zetia held Also had burning feeling with praluent LDL was 87 09/04/18   She has really failed all attempts at meds for her LDL She has been off everything   LDL 81 08/25/20  New onset PAF noted in cardiac rehab on 12/27/19 Asymptomatic with elevated HR Toprol dose increased to 25 ,g daily and Cardizem 120 mg added Anticoagulation started with eliquis Had 4 days diarrhea and Cardizem d/c CHADVASC 5 PAF noted in relation to COVID vaccine When seen in Afib clinic 12/31/19 was in NSR  She is allergic to ASA and has been on low dose eliquis and plavix given her CAD   She never knew she was in afib   June 2021 Eliquis d/c at patient request Risk of recurrence / stroke discussed  Her grand-daughter got married on her property in October    Past Medical History:  Diagnosis Date   Adenomatous polyps 07/1997   Breast fibroadenoma    CAD (coronary artery disease) 03/18/14   STEMI- stent to RCA and nonobstructive disease LAD and LCX   External hemorrhoids    GERD  (gastroesophageal reflux disease)    LA Class Grade C Erosive esophagitis   Hiatal hernia    HTN (hypertension)    Hyperlipidemia    Internal hemorrhoids    Osteopenia 04/2018   T score -1.9 FRAX 21% / 5%   Peptic stricture of esophagus    Skin cancer 2014   "forehead"   ST elevation myocardial infarction (STEMI) involving right coronary artery in recovery phase ([place]) 03/18/14   Past Surgical History:  Procedure Laterality Date   ABDOMINAL HYSTERECTOMY  1977   Leiomyoma   BREAST CYST ASPIRATION Right ~ 2011   BUNIONECTOMY Bilateral 04/04/2013   CARDIAC CATHETERIZATION N/A 09/29/2016   Procedure: Left Heart Cath and Coronary Angiography;  Surgeon: Belva Crome, MD;  Location: [place] CV LAB;  Service: Cardiovascular;  Laterality: N/A;   CORONARY ANGIOPLASTY WITH STENT PLACEMENT  03/18/14   Xience DES to totally occl RCA, residual non obstructive disease to LAD and LCX   ESOPHAGOGASTRODUODENOSCOPY (EGD) WITH ESOPHAGEAL DILATION  2000's X 1   LEFT HEART CATH N/A 03/18/2014   Procedure: LEFT HEART CATH;  Surgeon: Blane Ohara, MD;  Location: [facility] CATH LAB;  Service: Cardiovascular;  Laterality: N/A;   LEFT HEART CATHETERIZATION WITH CORONARY ANGIOGRAM N/A 04/22/2014   Procedure: LEFT HEART CATHETERIZATION WITH CORONARY ANGIOGRAM;  Surgeon: Blane Ohara, MD;  Location: [facility]  CATH LAB;  Service: Cardiovascular;  Laterality: N/A;   SKIN CANCER EXCISION  2014   THYROID CYST EXCISION  1970's   TONSILLECTOMY AND ADENOIDECTOMY  ~ 1950     Current Meds  Medication Sig   CALCIUM-VITAMIN D PO Take 1 tablet by mouth 2 (two) times daily.    clopidogrel (PLAVIX) 75 MG tablet TAKE 1 TABLET(75 MG) BY MOUTH AT BEDTIME   famotidine (PEPCID) 20 MG tablet Take 20 mg by mouth daily.   metoprolol succinate (TOPROL-XL) 25 MG 24 hr tablet TAKE 1 TABLET(25 MG) BY MOUTH DAILY WITH OR IMMEDIATELY FOLLOWING A MEAL   nitroGLYCERIN (NITROSTAT) 0.4 MG SL tablet Place 1 tablet (0.4 mg total) under the tongue  every 5 (five) minutes as needed for chest pain.   pantoprazole (PROTONIX) 40 MG tablet Take 1 tablet (40 mg total) by mouth daily.   rosuvastatin (CRESTOR) 5 MG tablet TAKE 1 TABLET BY MOUTH DAILY 5 DAYS A WEEK TO DAILY AS TOLERATED; FREQUENCY CHANGE     Allergies:   Naprosyn [naproxen], Ticagrelor, Aspirin, Atorvastatin, Other, Bempedoic acid, Gabapentin, and Praluent [alirocumab]   Social History   Tobacco Use   Smoking status: Never   Smokeless tobacco: Never  Vaping Use   Vaping Use: Never used  Substance Use Topics   Alcohol use: No    Alcohol/week: 0.0 standard drinks   Drug use: No     Family Hx: The patient's family history includes Breast cancer in her daughter and maternal aunt; Colon cancer in her maternal aunt; Colon polyps in her mother; Heart disease in her brother. There is no history of Esophageal cancer.  ROS:   Please see the history of present illness.     All other systems reviewed and are negative.   Prior CV studies:   The following studies were reviewed today:  Echo 05/04/18 EF normal trivial AR Cath 09/29/16  40-50% distal RCA 40-50% proximal LAD 50-70% D2 and 40% mid circumflex EF normal    Labs/Other Tests and Data Reviewed:    EKG: 08/21/2021 SR rate 64 normal    Recent Labs: No results found for requested labs within last 8760 hours.   Recent Lipid Panel Lab Results  Component Value Date/Time   CHOL 158 01/16/2020 09:08 AM   TRIG 105 01/16/2020 09:08 AM   HDL 55 01/16/2020 09:08 AM   CHOLHDL 2.9 01/16/2020 09:08 AM   CHOLHDL 2.8 06/17/2016 08:48 AM   LDLCALC 84 01/16/2020 09:08 AM   LDLDIRECT 112 (H) 06/15/2018 11:39 AM    Wt Readings from Last 3 Encounters:  08/21/21 136 lb (61.7 kg)  03/13/21 138 lb (62.6 kg)  03/10/21 139 lb (63 kg)     Objective:    Vital Signs:  BP (!) 148/80   Pulse 68   Ht 5\' 1"  (1.549 m)   Wt 136 lb (61.7 kg)   SpO2 97%   BMI 25.70 kg/m    Affect appropriate Healthy:  appears stated age HEENT:  normal Neck supple with no adenopathy JVP normal no bruits no thyromegaly Lungs clear with no wheezing and good diaphragmatic motion Heart:  S1/S2 no murmur, no rub, gallop or click PMI normal Abdomen: benighn, BS positve, no tenderness, no AAA no bruit.  No HSM or HJR Distal pulses intact with no bruits No edema Neuro non-focal Skin warm and dry No muscular weakness   ASSESSMENT & PLAN:    CAD:  Moderate by cath 09/29/16 no angina observe Patent RCA stent  HLD:  Intolerant to statins, praluent and zetia and Nexlitol observe GERD:  On protonix f/u primary EGD 06/19/18 benign f/u Dr Fuller Plan most of her symptoms are post prandial and at night  PAF:  Temporally related to COVID vaccine CHADVASC 5  was on low dose eliquis due to age/weight She prefers to stop anticoagulation and will monitor for irregular pulse closely   COVID-19 Education:   Medication Adjustments/Labs and Tests Ordered: Current medicines are reviewed at length with the patient today.  Concerns regarding medicines are outlined above.  Tests Ordered:  None   Medication Changes:  None   Disposition:  Follow up in 6 months   Signed, Jenkins Rouge, MD  08/21/2021 10:08 AM    [place]

## 2021-08-21 ENCOUNTER — Encounter: Payer: Self-pay | Admitting: Cardiovascular Disease

## 2021-08-21 ENCOUNTER — Ambulatory Visit: Payer: PPO | Admitting: Cardiovascular Disease

## 2021-08-21 ENCOUNTER — Other Ambulatory Visit: Payer: Self-pay

## 2021-08-21 VITALS — BP 148/80 | HR 68 | Ht 61.0 in | Wt 136.0 lb

## 2021-08-21 DIAGNOSIS — E785 Hyperlipidemia, unspecified: Secondary | ICD-10-CM

## 2021-08-21 DIAGNOSIS — I2510 Atherosclerotic heart disease of native coronary artery without angina pectoris: Secondary | ICD-10-CM | POA: Diagnosis not present

## 2021-08-21 DIAGNOSIS — I480 Paroxysmal atrial fibrillation: Secondary | ICD-10-CM | POA: Diagnosis not present

## 2021-08-21 NOTE — Patient Instructions (Signed)
Medication Instructions:  Your physician recommends that you continue on your current medications as directed. Please refer to the Current Medication list given to you today.  *If you need a refill on your cardiac medications before your next appointment, please call your pharmacy*   Lab Work: NONE If you have labs (blood work) drawn today and your tests are completely normal, you will receive your results only by: [place] (if you have MyChart) OR A paper copy in the mail If you have any lab test that is abnormal or we need to change your treatment, we will call you to review the results.   Testing/Procedures: NONE   Follow-Up: At [facility], you and your health needs are our priority.  As part of our continuing mission to provide you with exceptional heart care, we have created designated Provider Care Teams.  These Care Teams include your primary Cardiologist (physician) and Advanced Practice Providers (APPs -  Physician Assistants and Nurse Practitioners) who all work together to provide you with the care you need, when you need it.  We recommend signing up for the patient portal called "MyChart".  Sign up information is provided on this After Visit Summary.  MyChart is used to connect with patients for Virtual Visits (Telemedicine).  Patients are able to view lab/test results, encounter notes, upcoming appointments, etc.  Non-urgent messages can be sent to your provider as well.   To learn more about what you can do with MyChart, go to [email or web address].    Your next appointment:   12 month(s)  The format for your next appointment:   In Person  Provider:   Jenkins Rouge, MD {

## 2021-08-29 ENCOUNTER — Other Ambulatory Visit: Payer: Self-pay | Admitting: Cardiovascular Disease

## 2021-09-04 ENCOUNTER — Other Ambulatory Visit: Payer: Self-pay | Admitting: Cardiovascular Disease

## 2021-10-08 ENCOUNTER — Telehealth: Payer: Self-pay | Admitting: Cardiovascular Disease

## 2021-10-08 NOTE — Telephone Encounter (Signed)
Patient called since she had not heard back from our office since this morning. Patient concerned about her BP going up and down and HR staying elevated at times.  Patient stated right now her HR is fine and her BP is up. Encouraged patient to keep hydrated and since she has a history of PAF that her readings might not be accurate if she is in A FIB. Encouraged patient to also make sure she is eating a healthy diet and if her BP is low to drink fluids and have a small salty snack. Patient verbalized understanding and stated she would see [place] Monday when her husband has his appointment.

## 2021-10-08 NOTE — Telephone Encounter (Signed)
Pt called into office stating she was at [facility]hab this morning and still in parking lot there at time of call, and her blood pressure was low and HR high. Pt stated that when she arrived there and vitals were checked, her BP was 84/67 HR 114. Pt states that she still did her exercises and rechecked vitals once she was done. States that BP had increased to 125/70 , but HR was still at 127. During exercise she states that she drank a cup of water thinking that would help her BP. Per Pt, the employees at cardiac rehab told her she couldn't leave if she was unstable and pt stated that she would go to the car and call her cardiologist and head straight to [place] if needed. Pt states at the time of call she is still in the parking lot at cardiac rehab and feels "perfectly fine." Pt denies any symptoms at all, but states that Johnsie Cancel, MD instruced her if her HR ever got over 90 to call the office. Pt also states that she hasn't taken her Metoprolol Succ yet this morning bc she doesn't eat prior to exercise and prefers to take medications with food. Instructed pt to head straight home and take the Toprol and will let both MD and covering RN know situation. Pt will call back if becomes symptomatic or HR continues to rise. Judson Roch, RN

## 2021-10-08 NOTE — Telephone Encounter (Signed)
STAT if HR is under 50 or over 120 (normal HR is 60-100 beats per minute)  What is your heart rate? 127  Do you have a log of your heart rate readings (document readings)? 114  Do you have any other symptoms? no

## 2021-12-08 ENCOUNTER — Other Ambulatory Visit: Payer: Self-pay | Admitting: Cardiovascular Disease

## 2021-12-21 ENCOUNTER — Ambulatory Visit: Payer: PPO | Admitting: Radiology

## 2021-12-21 ENCOUNTER — Encounter: Payer: Self-pay | Admitting: Radiology

## 2021-12-21 VITALS — BP 148/78

## 2021-12-21 DIAGNOSIS — R3989 Other symptoms and signs involving the genitourinary system: Secondary | ICD-10-CM

## 2021-12-21 DIAGNOSIS — N8110 Cystocele, unspecified: Secondary | ICD-10-CM | POA: Diagnosis not present

## 2021-12-21 NOTE — Progress Notes (Signed)
? ? ? ? ?  SUBJECTIVE: Sarah Ray is a 81 y.o. female who complains of urinary frequency, urgency and mild dysuria x 1 month, without flank pain, fever, chills, or abnormal vaginal discharge or bleeding. Urine culture at PCP was negative. Feels pelvic and vaginal pressure ? ?OBJECTIVE: Appears well, in no apparent distress.  Vital signs are normal. The abdomen is soft without tenderness, guarding, mass, rebound or organomegaly. No CVA tenderness or inguinal adenopathy noted. Urine dipstick shows positive for nitrates (on AZO).  Micro exam: negative for WBC's or RBC's.  ? ?Chaperone offered and declined for exam. ? ?ASSESSMENT/PLAN: ?1. Sensation of pressure in bladder area ?Urine C+S to r/o infection ?- Urinalysis,Complete w/RFL Culture ? ?2. 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 ?Will send urine culture and sensitivity.  ?Treatment per orders - also push fluids, avoid bladder irritants. Instructed she may use Pyridium OTC prn. Call or return to clinic prn if these symptoms worsen or fail to improve as anticipated. Pyelo precautions reviewed with patient.  ?  ?

## 2021-12-23 LAB — URINALYSIS, COMPLETE W/RFL CULTURE
Bilirubin Urine: NEGATIVE
Glucose, UA: NEGATIVE
Hgb urine dipstick: NEGATIVE
Hyaline Cast: NONE SEEN /LPF
Ketones, ur: NEGATIVE
Leukocyte Esterase: NEGATIVE
Nitrites, Initial: POSITIVE — AB
Protein, ur: NEGATIVE
RBC / HPF: NONE SEEN /HPF (ref 0–2)
Specific Gravity, Urine: 1.01 (ref 1.001–1.035)
pH: 7 (ref 5.0–8.0)

## 2021-12-23 LAB — URINE CULTURE
MICRO NUMBER:: 13213929
Result:: NO GROWTH
SPECIMEN QUALITY:: ADEQUATE

## 2021-12-23 LAB — CULTURE INDICATED

## 2021-12-29 ENCOUNTER — Telehealth: Payer: Self-pay | Admitting: *Deleted

## 2021-12-29 ENCOUNTER — Ambulatory Visit: Payer: PPO | Admitting: Radiology

## 2021-12-29 VITALS — BP 150/80

## 2021-12-29 DIAGNOSIS — N952 Postmenopausal atrophic vaginitis: Secondary | ICD-10-CM

## 2021-12-29 DIAGNOSIS — N8110 Cystocele, unspecified: Secondary | ICD-10-CM

## 2021-12-29 MED ORDER — ESTRADIOL 0.1 MG/GM VA CREA: 1.0000 | TOPICAL_CREAM | VAGINAL | 12 refills | Status: DC

## 2021-12-29 NOTE — Progress Notes (Signed)
? ? ? ? ?  SUBJECTIVE: Sarah Ray is a 81 y.o. female who complains of urinary frequency, urgency and mild dysuria x 1 month, without flank pain, fever, chills, or abnormal vaginal discharge or bleeding. Urine culture at PCP was negative. Feels pelvic and vaginal pressure here for pessary fitting. ? ?OBJECTIVE: Appears well, in no apparent distress.  Vital signs are normal. The abdomen is soft without tenderness, guarding, mass, rebound or organomegaly. No CVA tenderness or inguinal adenopathy noted.  ? ?Began with a size 4 ring pessary with support, unable to tolerate while sitting. Sized down to a size 3 ring pessary with support. Pt ambulated, squatted, beared down and walked with pessary in and was much more comfortable. Did not shift with bearing down. Will proceed with ordering. Did have some scant bleeding. Will have patient begin estrace cream 2-3x/week to repair and protect vaginal tissues. ? ? ?Chaperone offered and declined for exam. ? 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 ?Ring 3 pessary with support ordered ?Estrace rx sent ? ? ? ?  ?

## 2021-12-29 NOTE — Telephone Encounter (Signed)
Pessary #3 ring with support ordered through [employer].  ?

## 2022-01-11 NOTE — Telephone Encounter (Signed)
Pessary delivered to office.  ?Call to patient.  ?Patient declines to schedule at this time, does not have her schedule. She will return call at later date/time to schedule office visit with [facility] for pessary placement.  ? ?Pessary to Santiago Glad, CMA.  ? ?Routing to provider for final review. Patient is agreeable to disposition. Will close encounter. ? ?

## 2022-01-25 ENCOUNTER — Encounter: Payer: Self-pay | Admitting: Radiology

## 2022-01-25 ENCOUNTER — Ambulatory Visit (INDEPENDENT_AMBULATORY_CARE_PROVIDER_SITE_OTHER): Payer: PPO | Admitting: Radiology

## 2022-01-25 VITALS — BP 138/76

## 2022-01-25 DIAGNOSIS — Z4689 Encounter for fitting and adjustment of other specified devices: Secondary | ICD-10-CM

## 2022-01-25 NOTE — Progress Notes (Signed)
? ? ? ? ?  SUBJECTIVE: Sarah Ray is a 81 y.o. female here for pessary placement for management of stage 3 cystocele. Has been using estrogen cream as ordered. ? ?OBJECTIVE: Appears well, in no apparent distress.  Vital signs are normal. The abdomen is soft without tenderness, guarding, mass, rebound or organomegaly. No CVA tenderness or inguinal adenopathy noted.  ? ?Placed a size 3 ring pessary with support. Pt ambulated, squatted, beared down and walked with pessary in and was much more comfortable. Did not shift with bearing down.  ? ?Chaperone offered and declined for exam. ? ?ASSESSMENT/PLAN: ? 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 ?Ring 3 pessary with support placed ?Reviewed removal and replacement  ?Continue Estrace  ? ? ? ?  ?

## 2022-02-22 ENCOUNTER — Other Ambulatory Visit: Payer: Self-pay | Admitting: Gastroenterology

## 2022-03-02 ENCOUNTER — Ambulatory Visit: Payer: PPO | Admitting: Radiology

## 2022-03-02 VITALS — BP 140/74

## 2022-03-02 DIAGNOSIS — Z4689 Encounter for fitting and adjustment of other specified devices: Secondary | ICD-10-CM | POA: Diagnosis not present

## 2022-03-02 DIAGNOSIS — R32 Unspecified urinary incontinence: Secondary | ICD-10-CM

## 2022-03-02 NOTE — Progress Notes (Signed)
      SUBJECTIVE: Sarah Ray is a 81 y.o. female who complains of leaking urine since pessary insertion.    OBJECTIVE: Appears well, in no apparent distress.  Vital signs are normal. The abdomen is soft without tenderness, guarding, mass, rebound or organomegaly. No CVA tenderness or inguinal adenopathy noted. Urine dipstick shows positive for RBC's, positive for protein, positive for glucose, positive for nitrates, and positive for leukocytes.  Micro exam: >60 WBC's per HPF, 3-10 RBC's per HPF, and ,any+ bacteria. *pt took [place] offered and declined for exam.  ASSESSMENT/PLAN:  1. Urinary incontinence, unspecified type Will contact with results of urine culture and treat as necessary - Urinalysis,Complete w/RFL Culture  2. Pessary maintenance Removed and cleaned, waxed dental floss attached to pessary to facilitate removal by patient   Will send urine culture and sensitivity.  Treatment per orders - also push fluids, avoid bladder irritants. Instructed she may use Pyridium OTC prn. Call or return to clinic prn if these symptoms worsen or fail to improve as anticipated. Pyelo precautions reviewed with patient.

## 2022-03-05 ENCOUNTER — Other Ambulatory Visit: Payer: Self-pay | Admitting: Radiology

## 2022-03-05 DIAGNOSIS — N390 Urinary tract infection, site not specified: Secondary | ICD-10-CM

## 2022-03-05 LAB — URINALYSIS, COMPLETE W/RFL CULTURE
Bilirubin Urine: NEGATIVE
Hyaline Cast: NONE SEEN /LPF
Ketones, ur: NEGATIVE
Nitrites, Initial: POSITIVE — AB
Specific Gravity, Urine: 1.01 (ref 1.001–1.035)
WBC, UA: >=60 /HPF — AB (ref 0–5)
pH: 6 (ref 5.0–8.0)

## 2022-03-05 LAB — URINE CULTURE
MICRO NUMBER:: 13518594
SPECIMEN QUALITY:: ADEQUATE

## 2022-03-05 LAB — CULTURE INDICATED

## 2022-03-05 MED ORDER — NITROFURANTOIN MONOHYD MACRO 100 MG PO CAPS: 100.0000 mg | ORAL_CAPSULE | Freq: Two times a day (BID) | ORAL | 0 refills | Status: DC

## 2022-03-12 ENCOUNTER — Telehealth: Payer: Self-pay

## 2022-03-12 DIAGNOSIS — N898 Other specified noninflammatory disorders of vagina: Secondary | ICD-10-CM

## 2022-03-12 MED ORDER — FLUCONAZOLE 150 MG PO TABS: ORAL_TABLET | ORAL | 0 refills | Status: DC

## 2022-03-12 NOTE — Telephone Encounter (Signed)
Pt calling to report probable yeast infection after taking abx for UTI. Reports itching/irritation and burning and was hoping we could call her in something at least for the weekend and if sxs dont resolve, she will call us Monday. Please advise.

## 2022-03-18 ENCOUNTER — Telehealth: Payer: Self-pay | Admitting: *Deleted

## 2022-03-18 NOTE — Telephone Encounter (Signed)
Jami patient) patient called has pessary,she was treated for UTI and yeast in last 2 weeks. She completed both Rx's, took last diflucan tablet on 03/15/22. Yesterday she noticed vulvar burning/itching and itchy/rash on her left leg inner thigh and rash closer to groin on right leg. Patient is not having any internal itching or burning, all external, no discharge, no odor, no urinary symptoms besides urine leakage. She uses dove soap, wears cotton panties, tries to keep dry as much as she can. I advised an office visit would be best, no appointments tomorrow. You had 8 am she can't make it due to another appointment. She asked if you have any ideas of what she can use over the weekend? She is not having any pain, she just would like some relief from itching over the weekend. Please advise

## 2022-03-18 NOTE — Telephone Encounter (Signed)
She can use Vaseline externally or get an over the counter steroid ointment to use.

## 2022-03-19 NOTE — Telephone Encounter (Signed)
Patient informed. 

## 2022-03-19 NOTE — Telephone Encounter (Signed)
Left message for patient to call.

## 2022-04-08 ENCOUNTER — Telehealth: Payer: Self-pay | Admitting: Cardiovascular Disease

## 2022-04-08 NOTE — Telephone Encounter (Signed)
Patient c/o Palpitations:  High priority if patient c/o lightheadedness, shortness of breath, or chest pain  How long have you had palpitations/irregular HR/ Afib? Are you having the symptoms now? All day   Are you currently experiencing lightheadedness, SOB or CP? lightheaded  Do you have a history of afib (atrial fibrillation) or irregular heart rhythm? Didn't before covid   Have you checked your BP or HR? (document readings if available):  156/82  hr91 Hr111 Hr 97  Are you experiencing any other symptoms? She states that she had some afib today and she called 911. They took her BP and HR and suggested she call us. She states she is a little lightheaded and has a headache. She denies any cp. Call transferred to triage.

## 2022-04-08 NOTE — Telephone Encounter (Signed)
Call came straight to triage. Patient went into A. FIB and had some issues, so she called 911. EMS verified patient was in A. FIB HR ranging from 97 to 111. Patient stated her BP was 161/92. Encouraged patient to take her eliquis that she was prescribed in the past and take an extra metoprolol succinate 25 mg to help with BP and HR. Maded patient an appointment with  A. FIB clinic for tomorrow. DOD Consulted, Dr. Caryl Comes, and agreed with plan.

## 2022-04-09 ENCOUNTER — Ambulatory Visit (HOSPITAL_COMMUNITY)
Admission: RE | Admit: 2022-04-09 | Discharge: 2022-04-09 | Disposition: A | Discharge status: Home / Self Care | Payer: PPO | Source: Ambulatory Visit | Attending: Physician Assistant | Admitting: Physician Assistant

## 2022-04-09 ENCOUNTER — Encounter (HOSPITAL_COMMUNITY): Payer: Self-pay | Admitting: Physician Assistant

## 2022-04-09 ENCOUNTER — Other Ambulatory Visit (HOSPITAL_COMMUNITY): Payer: Self-pay | Admitting: *Deleted

## 2022-04-09 VITALS — BP 132/78 | HR 66 | Ht 61.0 in | Wt 138.4 lb

## 2022-04-09 DIAGNOSIS — I10 Essential (primary) hypertension: Secondary | ICD-10-CM | POA: Diagnosis not present

## 2022-04-09 DIAGNOSIS — I480 Paroxysmal atrial fibrillation: Secondary | ICD-10-CM | POA: Insufficient documentation

## 2022-04-09 DIAGNOSIS — E785 Hyperlipidemia, unspecified: Secondary | ICD-10-CM | POA: Insufficient documentation

## 2022-04-09 DIAGNOSIS — D6869 Other thrombophilia: Secondary | ICD-10-CM | POA: Diagnosis not present

## 2022-04-09 DIAGNOSIS — Z7901 Long term (current) use of anticoagulants: Secondary | ICD-10-CM | POA: Insufficient documentation

## 2022-04-09 DIAGNOSIS — I2510 Atherosclerotic heart disease of native coronary artery without angina pectoris: Secondary | ICD-10-CM | POA: Diagnosis not present

## 2022-04-09 LAB — BASIC METABOLIC PANEL
Anion gap: 6 (ref 5–15)
BUN: 13 mg/dL (ref 8–23)
CO2: 23 mmol/L (ref 22–32)
Calcium: 9.3 mg/dL (ref 8.9–10.3)
Chloride: 108 mmol/L (ref 98–111)
Creatinine, Ser: 0.98 mg/dL (ref 0.44–1.00)
GFR, Estimated: 58 mL/min — ABNORMAL LOW (ref >60)
Glucose, Bld: 97 mg/dL (ref 70–99)
Potassium: 4.2 mmol/L (ref 3.5–5.1)
Sodium: 137 mmol/L (ref 135–145)

## 2022-04-09 MED ORDER — APIXABAN 5 MG PO TABS: 5.0000 mg | ORAL_TABLET | Freq: Two times a day (BID) | ORAL | 6 refills | Status: DC

## 2022-04-09 NOTE — Progress Notes (Signed)
Primary Care Physician: Raina Mina., MD Primary Cardiologist: Dr Johnsie Cancel Primary Electrophysiologist: none Referring Physician: Dr Vernon Prey is a 81 y.o. female with a history of CAD, HTN, HLD, and new onset paroxysmal atrial fibrillation who presents for follow up in the [facility].  The patient was initially diagnosed with atrial fibrillation on 12/27/19 in cardiac rehab. She was asymptomatic but noted that her heart rate was elevated. ECG showed afib with RVR. Her metoprolol was increased and she was started on diltiazem. Patient has a CHADS2VASC score of 5. She denies snoring or alcohol use.   On follow up today, patient started having symptoms of lightheadedness with an "unusual" feeling in her chest. She noted her heart rate was elevated and called EMS which confirmed she was out of rhythm. She elected to not go to the ED and called [facility]. Her Eliquis was resumed and her metoprolol was increased which resolved her symptoms. Patient has noted over the last few months that her heart rate will occasionally be elevated without any specific triggers. She is in SR today.   Today, she denies symptoms of chest pain, shortness of breath, orthopnea, PND, lower extremity edema, dizziness, presyncope, syncope, snoring, daytime somnolence, bleeding, or neurologic sequela. The patient is tolerating medications without difficulties and is otherwise without complaint today.    Atrial Fibrillation Risk Factors:  she does not have symptoms or diagnosis of sleep apnea. she does not have a history of rheumatic fever. she does not have a history of alcohol use. The patient does have a history of early familial atrial fibrillation or other arrhythmias. Brother has afib and PPM.  she has a BMI of Body mass index is 26.15 kg/m.Marland Kitchen Filed Weights   04/09/22 1006  Weight: 62.8 kg     Family History  Problem Relation Age of Onset   Colon polyps Mother    Colon  cancer Maternal 57    Breast cancer Maternal 55        Age 51   Breast cancer Daughter        Age 87   Heart disease Brother        irregular heart beat   Esophageal cancer Neg Hx      Atrial Fibrillation Management history:  Previous antiarrhythmic drugs: none Previous cardioversions: none Previous ablations: none CHADS2VASC score: 5 Anticoagulation history: Eliquis   Past Medical History:  Diagnosis Date   Adenomatous polyps 07/1997   Breast fibroadenoma    CAD (coronary artery disease) 03/18/2014   STEMI- stent to RCA and nonobstructive disease LAD and LCX   External hemorrhoids    GERD (gastroesophageal reflux disease)    LA Class Grade C Erosive esophagitis   Heart attack ([place]) 2015   Hiatal hernia    HTN (hypertension)    Hyperlipidemia    Internal hemorrhoids    Osteopenia 04/2018   T score -1.9 FRAX 21% / 5%   Peptic stricture of esophagus    Skin cancer 2014   "forehead"   ST elevation myocardial infarction (STEMI) involving right coronary artery in recovery phase ([place]) 03/18/2014   Past Surgical History:  Procedure Laterality Date   ABDOMINAL HYSTERECTOMY  1977   Leiomyoma   BREAST CYST ASPIRATION Right ~ 2011   BUNIONECTOMY Bilateral 04/04/2013   CARDIAC CATHETERIZATION N/A 09/29/2016   Procedure: Left Heart Cath and Coronary Angiography;  Surgeon: Belva Crome, MD;  Location: [facility];  Service: Cardiovascular;  Laterality: N/A;   CORONARY ANGIOPLASTY WITH STENT PLACEMENT  03/18/14   Xience DES to totally occl RCA, residual non obstructive disease to LAD and LCX   ESOPHAGOGASTRODUODENOSCOPY (EGD) WITH ESOPHAGEAL DILATION  2000's X 1   LEFT HEART CATH N/A 03/18/2014   Procedure: LEFT HEART CATH;  Surgeon: Blane Ohara, MD;  Location: [facility] CATH LAB;  Service: Cardiovascular;  Laterality: N/A;   LEFT HEART CATHETERIZATION WITH CORONARY ANGIOGRAM N/A 04/22/2014   Procedure: LEFT HEART CATHETERIZATION WITH CORONARY ANGIOGRAM;  Surgeon: Blane Ohara, MD;  Location: [facility] CATH LAB;  Service: Cardiovascular;  Laterality: N/A;   SKIN CANCER EXCISION  2014   THYROID CYST EXCISION  1970's   TONSILLECTOMY AND ADENOIDECTOMY  ~ 1950    Current Outpatient Medications  Medication Sig Dispense Refill   CALCIUM-VITAMIN D PO Take 1 tablet by mouth 2 (two) times daily.      clopidogrel (PLAVIX) 75 MG tablet TAKE 1 TABLET(75 MG) BY MOUTH AT BEDTIME 90 tablet 3   ELIQUIS 2.5 MG TABS tablet Take 2.5 mg by mouth in the morning and at bedtime.     estradiol (ESTRACE VAGINAL) 0.1 MG/GM vaginal cream Place 1 Applicatorful vaginally 3 (three) times a week. 42.5 g 12   famotidine (PEPCID) 20 MG tablet Take 20 mg by mouth daily.     metoprolol succinate (TOPROL-XL) 25 MG 24 hr tablet TAKE 1 TABLET(25 MG) BY MOUTH DAILY WITH OR IMMEDIATELY FOLLOWING A MEAL (Patient taking differently: Take 25 mg by mouth in the morning and at bedtime.) 90 tablet 3   nitrofurantoin, macrocrystal-monohydrate, (MACROBID) 100 MG capsule Take 1 capsule (100 mg total) by mouth 2 (two) times daily. 14 capsule 0   nitroGLYCERIN (NITROSTAT) 0.4 MG SL tablet Place 1 tablet (0.4 mg total) under the tongue every 5 (five) minutes as needed for chest pain. 25 tablet 5   pantoprazole (PROTONIX) 40 MG tablet TAKE 1 TABLET(40 MG) BY MOUTH DAILY 90 tablet 0   rosuvastatin (CRESTOR) 5 MG tablet TAKE 1 TABLET BY MOUTH DAILY 5 DAYS A WEEK TO DAILY AS TOLERATED 30 tablet 9   vitamin B-12 (CYANOCOBALAMIN) 1000 MCG tablet SMARTSIG:1 Tablet(s) By Mouth     No current facility-administered medications for this encounter.    Allergies  Allergen Reactions   Naprosyn [Naproxen] Hives   Ticagrelor Shortness Of Breath and Other (See Comments)    Caused chest pains Caused chest pains   Aspirin Hives, Diarrhea and Swelling    Benadryl stops reaction   Atorvastatin Other (See Comments)    Myalgias and leg heaviness  Myalgias and leg heaviness   Other Other (See Comments)    Very sensitive to  all meds Very sensitive to all meds   Bempedoic Acid     Burning sensation   Gabapentin     dizziness   Praluent [Alirocumab]     Flu like symptoms    Social History   Socioeconomic History   Marital status: Married    Spouse name: [name]   Number of children: 2   Years of education: Associates   Highest education level: Not on file  Occupational History   Occupation: Dance movement psychotherapist  Tobacco Use   Smoking status: Never   Smokeless tobacco: Never   Tobacco comments:    Never smoke 04/09/22  Vaping Use   Vaping Use: Never used  Substance and Sexual Activity   Alcohol use: No    Alcohol/week: 0.0 standard drinks of alcohol   Drug use: No  Sexual activity: Not Currently    Birth control/protection: Surgical, Post-menopausal    Comment: HYST-1st intercourse 81 yo-Fewer than 5 partners  Other Topics Concern   Not on file  Social History Narrative   Lives with husband   Daily caffeine:  1 cup per day (coffee or tea)   Social Determinants of Health   Financial Resource Strain: Not on file  Food Insecurity: Not on file  Transportation Needs: Not on file  Physical Activity: Not on file  Stress: Not on file  Social Connections: Not on file  Intimate Partner Violence: Not on file     ROS- All systems are reviewed and negative except as per the HPI above.  Physical Exam: Vitals:   04/09/22 1006  BP: 132/78  Pulse: 66  Weight: 62.8 kg  Height: '5\' 1"'$  (1.549 m)     GEN- The patient is a well appearing elderly female, alert and oriented x 3 today.   HEENT-head normocephalic, atraumatic, sclera clear, conjunctiva pink, hearing intact, trachea midline. Lungs- Clear to ausculation bilaterally, normal work of breathing Heart- Regular rate and rhythm, no murmurs, rubs or gallops  GI- soft, NT, ND, + BS Extremities- no clubbing, cyanosis, or edema MS- no significant deformity or atrophy Skin- no rash or lesion Psych- euthymic mood, full affect Neuro- strength  and sensation are intact   Wt Readings from Last 3 Encounters:  04/09/22 62.8 kg  08/21/21 61.7 kg  03/13/21 62.6 kg    EKG today demonstrates  SR Vent. rate 66 BPM PR interval 146 ms QRS duration 90 ms QT/QTcB 418/438 ms  Echo 05/04/18 demonstrated  - Left ventricle: The cavity size was normal. Systolic function was    normal. The estimated ejection fraction was in the range of 55%    to 60%. Wall motion was normal; there were no regional wall    motion abnormalities. Left ventricular diastolic function    parameters were normal.  - Aortic valve: There was trivial regurgitation.  - Mitral valve: Valve area by pressure half-time: 1.72 cm^2.  - Left atrium: The atrium was mildly dilated.  Epic records are reviewed at length today  CHA2DS2-VASc Score = 5  The patient's score is based upon: CHF History: 0 HTN History: 1 Diabetes History: 0 Stroke History: 0 Vascular Disease History: 1 Age Score: 2 Gender Score: 1        ASSESSMENT AND PLAN: 1. Paroxysmal Atrial Fibrillation (ICD10:  I48.0) The patient's CHA2DS2-VASc score is 5, indicating a 7.2% annual risk of stroke.  Patient back in SR. We discussed rhythm control options today. She has sensitivities to many medications and is anxious about starting an antiarrhythmic. She also does not feel her symptoms warrant ablation consideration at this time. She would like to see how she does on the higher dose of BB for now.  Continue metoprolol 25 mg BID Will increase dose of Eliquis to 5 mg BID now that her weight is >60kg and her Cr is < 1.5.   2. Secondary Hypercoagulable State (ICD10:  D68.69) The patient is at significant risk for stroke/thromboembolism based upon her CHA2DS2-VASc Score of 5.  Continue Apixaban (Eliquis).   3. CAD S/p MI 2015 with PCI to RCA. Followed by Dr Johnsie Cancel. No anginal symptoms.  4. HTN Stable, no changes today.   Follow up in the AF clinic in 3 months.    [facility] [address] [place], [place] [address] [phone] 04/09/2022 10:15 AM

## 2022-04-19 ENCOUNTER — Telehealth (HOSPITAL_COMMUNITY): Payer: Self-pay

## 2022-04-19 MED ORDER — METOPROLOL SUCCINATE ER 25 MG PO TB24: ORAL_TABLET | ORAL | Status: DC

## 2022-04-19 NOTE — Telephone Encounter (Signed)
Patient called in about her blood pressure ranging from 681-157 systolic pressure and diastolic ranging from 26-20 HR- 50-60. She is feeling dizzy and tired. Per Adline Peals PA decrease Metoprolol to '25mg'$  once daily and she can take 1 extra tablet of Metoprolol when she is in A-fib. She is concerned about her bruising and she has had some bleeding from her Vagina. For the bleeding she needs to contact her pcp or gynecologist. Per Adline Peals PA- she needs to continue taking the Eliquis '5mg'$  BID. The dose needs to stay at '5mg'$  BID to prevent her from having a stroke, weight over 60kg and because of her kidney function being normal. She was told we can discuss about possibly reducing her Eliquis as long as she is below 60kg and meets the criteria. Consulted with patient and she verbalized understanding

## 2022-05-03 ENCOUNTER — Telehealth (HOSPITAL_COMMUNITY): Payer: Self-pay

## 2022-05-03 MED ORDER — METOPROLOL SUCCINATE ER 25 MG PO TB24: ORAL_TABLET | ORAL | Status: DC

## 2022-05-03 NOTE — Telephone Encounter (Signed)
Patient called in regarding A-fib. Symptoms started on Friday and continued through Saturday. B/P was 126/99 and HR 101. No A-fib on Sunday. She has been taking Metoprolol '25mg'$  - 1 tablet twice daily during this episode. This morning her B/P was 104/58, HR 60. Per the patient she does not like the way the Metoprolol is making her feel. Her complaint is she feels lightheaded. She had some chest discomfort with pain radiating from her neck down her arms. Patient states she has bulging disc. Consulted with Adline Peals PA- instruct patient to decrease Metoprolol '25mg'$ - to 1/2 tablet twice daily. Communicated with patient and she verbalized understanding.

## 2022-05-07 ENCOUNTER — Telehealth (HOSPITAL_COMMUNITY): Payer: Self-pay

## 2022-05-07 NOTE — Telephone Encounter (Signed)
Patient called in about her A-fib. She had A-fib all day yesterday. Her B/P 123/70, HR running 60-80 range. She is still having a burning sensation in her chest. Per Tilda Franco if she continues to have burning in her chest she should reach out to her Dr. Johnsie Cancel her cardiologist. She is having A-fib about every other day. She increased her Metoprolol  yesterday- taking 1/2 tablet of a 25 mg am, noon and pm to see if this would help her A-fib. Previously her Metoprolol was decreased to '25mg'$ -taking 1/2 tablet twice daily which is probably causing her to have more A-fib.Today she is not in A-fib. Consulted with [employer] patient should come in sooner for appointment to discuss medication management regarding her Metoprolol. Patient is scheduled to come in on 9/7 @ 10:00am to see [facility]. Communicated with patient and she verbalized understanding.

## 2022-05-17 ENCOUNTER — Telehealth: Payer: Self-pay | Admitting: Cardiology

## 2022-05-17 ENCOUNTER — Telehealth: Payer: Self-pay | Admitting: Cardiovascular Disease

## 2022-05-17 NOTE — Telephone Encounter (Signed)
Patient wants

## 2022-05-17 NOTE — Progress Notes (Unsigned)
Office Visit    Patient Name: Sarah Ray Date of Encounter: 05/17/2022  Primary Care Provider:  Raina Mina., MD Primary Cardiologist:  Jenkins Rouge, MD Primary Electrophysiologist: None  Chief Complaint    Sarah Ray is a 81 y.o. female with PMH of CAD s/p STEMI 02/2014 treated with DES x1 to RCA, repeat cath treated with stents in 04/2014, HTN, HLD who presents today with complaint of burning chest pain.  Past Medical History    Past Medical History:  Diagnosis Date   Adenomatous polyps 07/1997   Breast fibroadenoma    CAD (coronary artery disease) 03/18/2014   STEMI- stent to RCA and nonobstructive disease LAD and LCX   External hemorrhoids    GERD (gastroesophageal reflux disease)    LA Class Grade C Erosive esophagitis   Heart attack ([place]) 2015   Hiatal hernia    HTN (hypertension)    Hyperlipidemia    Internal hemorrhoids    Osteopenia 04/2018   T score -1.9 FRAX 21% / 5%   Peptic stricture of esophagus    Skin cancer 2014   "forehead"   ST elevation myocardial infarction (STEMI) involving right coronary artery in recovery phase ([place]) 03/18/2014   Past Surgical History:  Procedure Laterality Date   ABDOMINAL HYSTERECTOMY  1977   Leiomyoma   BREAST CYST ASPIRATION Right ~ 2011   BUNIONECTOMY Bilateral 04/04/2013   CARDIAC CATHETERIZATION N/A 09/29/2016   Procedure: Left Heart Cath and Coronary Angiography;  Surgeon: Belva Crome, MD;  Location: [place];  Service: Cardiovascular;  Laterality: N/A;   CORONARY ANGIOPLASTY WITH STENT PLACEMENT  03/18/14   Xience DES to totally occl RCA, residual non obstructive disease to LAD and LCX   ESOPHAGOGASTRODUODENOSCOPY (EGD) WITH ESOPHAGEAL DILATION  2000's X 1   LEFT HEART CATH N/A 03/18/2014   Procedure: LEFT HEART CATH;  Surgeon: Blane Ohara, MD;  Location: [facility] CATH LAB;  Service: Cardiovascular;  Laterality: N/A;   LEFT HEART CATHETERIZATION WITH CORONARY ANGIOGRAM N/A 04/22/2014   Procedure: LEFT  HEART CATHETERIZATION WITH CORONARY ANGIOGRAM;  Surgeon: Blane Ohara, MD;  Location: [facility] CATH LAB;  Service: Cardiovascular;  Laterality: N/A;   SKIN CANCER EXCISION  2014   THYROID CYST EXCISION  1970's   TONSILLECTOMY AND ADENOIDECTOMY  ~ 1950    Allergies  Allergies  Allergen Reactions   Naprosyn [Naproxen] Hives   Ticagrelor Shortness Of Breath and Other (See Comments)    Caused chest pains Caused chest pains   Aspirin Hives, Diarrhea and Swelling    Benadryl stops reaction   Atorvastatin Other (See Comments)    Myalgias and leg heaviness  Myalgias and leg heaviness   Other Other (See Comments)    Very sensitive to all meds Very sensitive to all meds   Bempedoic Acid     Burning sensation   Gabapentin     dizziness   Praluent [Alirocumab]     Flu like symptoms    History of Present Illness    Sarah Ray is a 81 year old female with the above-mentioned past medical history who presents today with complaint of burning chest pain.  Sarah Ray was initially seen in 2015 with STEMI that was treated with DES x1 to RCA.  She was subsequently readmitted 2 months later with complaint of recurrent chest pain and found to be related to cervical stenosis.  She developed new onset paroxysmal A-fib in cardiac rehab in 2021 with elevated heart rate that  was managed with beta-blocker and Cardizem.  She was started on Eliquis and developed side effects with request to come off anticoagulation.  She was educated about the risk of reoccurring stroke with discontinued NOAC. She was last seen by Dr. Johnsie Cancel on 08/2021 for follow-up.  She had no reports of chest pain at that time and was currently on Praluent and Zetia.  She was seen in the atrial fibrillation clinic on 03/2022 bout of elevated heart rate.  She elected to come to heart care instead of going to the ED and Eliquis was resumed and metoprolol increased.  She declined antiarrhythmic medications and ablation at that time.  Sarah Ray  presents today with her daughter for follow-up visit chest pressure and burning.  Since last being seen in the office patient reports she is noticed burning in her chest that travels down her arms and through to her back.  She does note some dizziness but states that the pain occurs with or without activity.  Blood pressure today was well controlled at 126/72.  She denies using any nitroglycerin or requiring any additional pain medication.  She has numerous allergies and is planning to follow-up with an allergist next month.  She is hoping to transition from Plavix to aspirin due to current allergy.  Patient denies chest pain, palpitations, dyspnea, PND, orthopnea, nausea, vomiting, dizziness, syncope, edema, weight gain, or early satiety.  Home Medications    Current Outpatient Medications  Medication Sig Dispense Refill   apixaban (ELIQUIS) 5 MG TABS tablet Take 1 tablet (5 mg total) by mouth 2 (two) times daily. 60 tablet 6   CALCIUM-VITAMIN D PO Take 1 tablet by mouth 2 (two) times daily.      clopidogrel (PLAVIX) 75 MG tablet TAKE 1 TABLET(75 MG) BY MOUTH AT BEDTIME 90 tablet 3   estradiol (ESTRACE VAGINAL) 0.1 MG/GM vaginal cream Place 1 Applicatorful vaginally 3 (three) times a week. 42.5 g 12   famotidine (PEPCID) 20 MG tablet Take 20 mg by mouth daily.     metoprolol succinate (TOPROL-XL) 25 MG 24 hr tablet Take 1/2 tablet by mouth twice daily and make take 1 extra tablet for break through A-fib     nitrofurantoin, macrocrystal-monohydrate, (MACROBID) 100 MG capsule Take 1 capsule (100 mg total) by mouth 2 (two) times daily. 14 capsule 0   nitroGLYCERIN (NITROSTAT) 0.4 MG SL tablet Place 1 tablet (0.4 mg total) under the tongue every 5 (five) minutes as needed for chest pain. 25 tablet 5   pantoprazole (PROTONIX) 40 MG tablet TAKE 1 TABLET(40 MG) BY MOUTH DAILY 90 tablet 0   rosuvastatin (CRESTOR) 5 MG tablet TAKE 1 TABLET BY MOUTH DAILY 5 DAYS A WEEK TO DAILY AS TOLERATED 30 tablet 9    vitamin B-12 (CYANOCOBALAMIN) 1000 MCG tablet SMARTSIG:1 Tablet(s) By Mouth     No current facility-administered medications for this visit.     Review of Systems  Please see the history of present illness.    (+) Chest burning (+) Dizziness  All other systems reviewed and are otherwise negative except as noted above.  Physical Exam    Wt Readings from Last 3 Encounters:  04/09/22 138 lb 6.4 oz (62.8 kg)  08/21/21 136 lb (61.7 kg)  03/13/21 138 lb (62.6 kg)   ER:DEYCX were no vitals filed for this visit.,There is no height or weight on file to calculate BMI.  Constitutional:      Appearance: Healthy appearance. Not in distress.  Neck:  Vascular: JVD normal.  Pulmonary:     Effort: Pulmonary effort is normal.     Breath sounds: No wheezing. No rales. Diminished in the bases Cardiovascular:     Normal rate. Regular rhythm. Normal S1. Normal S2.      Murmurs: There is no murmur.  Edema:    Peripheral edema absent.  Abdominal:     Palpations: Abdomen is soft non tender. There is no hepatomegaly.  Skin:    General: Skin is warm and dry.  Neurological:     General: No focal deficit present.     Mental Status: Alert and oriented to person, place and time.     Cranial Nerves: Cranial nerves are intact.  EKG/LABS/Other Studies Reviewed    ECG personally reviewed by me today -sinus rhythm with rate of 64 bpm and TWI in lead III that is consistent with previous EKG with no acute changes  Risk Assessment/Calculations:    CHA2DS2-VASc Score = 5   This indicates a 7.2% annual risk of stroke. The patient's score is based upon: CHF History: 0 HTN History: 1 Diabetes History: 0 Stroke History: 0 Vascular Disease History: 1 Age Score: 2 Gender Score: 1     Lab Results  Component Value Date   WBC 3.8 (L) 12/31/2019   HGB 13.4 12/31/2019   HCT 41.4 12/31/2019   MCV 102.7 (H) 12/31/2019   PLT 200 12/31/2019   Lab Results  Component Value Date   CREATININE 0.98  04/09/2022   BUN 13 04/09/2022   NA 137 04/09/2022   K 4.2 04/09/2022   CL 108 04/09/2022   CO2 23 04/09/2022   Lab Results  Component Value Date   ALT 12 01/16/2020   AST 19 01/16/2020   ALKPHOS 96 01/16/2020   BILITOT 0.7 01/16/2020   Lab Results  Component Value Date   CHOL 158 01/16/2020   HDL 55 01/16/2020   LDLCALC 84 01/16/2020   LDLDIRECT 112 (H) 06/15/2018   TRIG 105 01/16/2020   CHOLHDL 2.9 01/16/2020    Lab Results  Component Value Date   HGBA1C 5.8 (H) 03/18/2014    Assessment & Plan    1.  Coronary artery disease: -s/p STEMI 02/2014 treated with DES x1 to RCA, repeat cath treated with stents in 04/2014 -Patient reports burning pain in chest that travels down the arms and through to back.  -Lexiscan exercise Myoview to evaluate complaint and possible ischemic changes. -2D echo to evaluate heart structure and valve function due to shortness of breath.  2.  Paroxysmal atrial fibrillation: -Patient currently sinus rhythm today with a rate of 64 -She is currently rate controlled with Toprol XL 12.5 mg twice daily -She reports no bleeding concerns or issues at this time -Continue Eliquis 5 mg twice daily -Dose appropriate based on renal function and patient's current weight  3.  Hypertension: -Blood pressure today was well controlled at 126/72 -Continue current antihypertensive regimen as noted above  4.  Hyperlipidemia: -Patient has a history of statin intolerance and is currently followed by lipid clinic.  -Continue Crestor 5 mg 5 days/week -Current LDL cholesterol is 40 well-controlled and less than goal of 70     Shared Decision Making/Informed Consent The risks [chest pain, shortness of breath, cardiac arrhythmias, dizziness, blood pressure fluctuations, myocardial infarction, stroke/transient ischemic attack, nausea, vomiting, allergic reaction, radiation exposure, metallic taste sensation and life-threatening complications (estimated to be 1 in  10,000)], benefits (risk stratification, diagnosing coronary artery disease, treatment guidance) and alternatives of a  nuclear stress test were discussed in detail with Sarah Ray and she agrees to proceed.   Disposition: Follow-up with Jenkins Rouge, MD or APP in 1 months    Medication Adjustments/Labs and Tests Ordered: Current medicines are reviewed at length with the patient today.  Concerns regarding medicines are outlined above.   Signed, Mable Fill, Marissa Nestle, NP 05/17/2022, 8:10 PM [place]

## 2022-05-17 NOTE — Telephone Encounter (Signed)
Returned call to patient. Patient reports increased fatigue, symptoms of burning in chest, sometimes down arms and in her back. She denies SOB and N/V. Patient states this had been going on since her appointment with AFIB clinic in July but seems to be worsening.  Patient also reports taking Tums with some relief and she is on Protonix for GERD.   Patient wants to see Dr. Johnsie Cancel but he does not have availability this week. She is agreeable to seeing an APP for evaluation. Scheduled for 05/19/2023.   Advised to continue using Tums or Mylanta for discomfort and if symptoms worsen, she develops SOB, NV or worsening chest pain to go to the ED for evaluation. Patient verbalized understanding and agreed to this plan.

## 2022-05-17 NOTE — Telephone Encounter (Signed)
Patient called to talk with Dr. Johnsie Cancel or nurse. Would not give details. Please call back

## 2022-05-18 ENCOUNTER — Encounter: Payer: Self-pay | Admitting: Nurse Practitioner

## 2022-05-18 ENCOUNTER — Ambulatory Visit: Payer: PPO | Attending: Nurse Practitioner | Admitting: Nurse Practitioner

## 2022-05-18 VITALS — BP 126/72 | HR 64 | Ht 61.0 in | Wt 134.2 lb

## 2022-05-18 DIAGNOSIS — R072 Precordial pain: Secondary | ICD-10-CM | POA: Diagnosis not present

## 2022-05-18 DIAGNOSIS — I2510 Atherosclerotic heart disease of native coronary artery without angina pectoris: Secondary | ICD-10-CM

## 2022-05-18 DIAGNOSIS — E785 Hyperlipidemia, unspecified: Secondary | ICD-10-CM | POA: Diagnosis not present

## 2022-05-18 DIAGNOSIS — I480 Paroxysmal atrial fibrillation: Secondary | ICD-10-CM

## 2022-05-18 NOTE — Patient Instructions (Signed)
Medication Instructions:  ,Your physician recommends that you continue on your current medications as directed. Please refer to the Current Medication list given to you today.  *If you need a refill on your cardiac medications before your next appointment, please call your pharmacy*   Lab Work: None ordered  If you have labs (blood work) drawn today and your tests are completely normal, you will receive your results only by: [place] (if you have MyChart) OR A paper copy in the mail If you have any lab test that is abnormal or we need to change your treatment, we will call you to review the results.   Testing/Procedures: Your physician has requested that you have an echocardiogram. Echocardiography is a painless test that uses sound waves to create images of your heart. It provides your doctor with information about the size and shape of your heart and how well your heart's chambers and valves are working. This procedure takes approximately one hour. There are no restrictions for this procedure.  Your physician has requested that you have en exercise stress myoview. For further information please visit [email or web address]. Please follow instruction sheet, BELOW:    You are scheduled for an Exercise Stress Test   Please arrive 15 minutes prior to your appointment time for registration and insurance purposes.   The test will take approximately 45 minutes to complete.   How to prepare for your Exercise Stress Test:    Do bring a list of your current medications with you. If not listed below, you may take your medications as normal.    Do not take METOPROLOL the monring of the test Bring it to your appointment as you may be required to take it once the test is complete.   Do wear comfortable closes (no dresses or overalls) and walking shoes - tennis shoes are preferred. No open toed shoes or heels.  Do not wear cologne, perfume, aftershave or lotions (deodorant is  allowed).  Please report to the [facility] for your test.   If you have questions or concerns about your appointment, you can call the Exercise Stress Lab at [phone].   If you cannot keep your appointment, please provide 24 hours notification to the Exercise Stress Lab to avoid a possible $50 charge to your account.                   .    Follow-Up: At [facility], you and your health needs are our priority.  As part of our continuing mission to provide you with exceptional heart care, we have created designated Provider Care Teams.  These Care Teams include your primary Cardiologist (physician) and Advanced Practice Providers (APPs -  Physician Assistants and Nurse Practitioners) who all work together to provide you with the care you need, when you need it.  We recommend signing up for the patient portal called "MyChart".  Sign up information is provided on this After Visit Summary.  MyChart is used to connect with patients for Virtual Visits (Telemedicine).  Patients are able to view lab/test results, encounter notes, upcoming appointments, etc.  Non-urgent messages can be sent to your provider as well.   To learn more about what you can do with MyChart, go to [email or web address].    Your next appointment:   1 month(s)  The format for your next appointment:   In Person  Provider:   Ambrose Pancoast, NP         Other  Instructions   Important Information About Sugar

## 2022-05-19 NOTE — Addendum Note (Signed)
Addended by: Ambrose Pancoast H on: 05/19/2022 08:00 AM   Modules accepted: Orders

## 2022-05-21 ENCOUNTER — Telehealth (HOSPITAL_COMMUNITY): Payer: Self-pay | Admitting: *Deleted

## 2022-05-21 NOTE — Telephone Encounter (Signed)
Patient given detailed instructions per Myocardial Perfusion Study Information Sheet for the test on 05/28/2022 at 7:15. Patient notified to arrive 15 minutes early and that it is imperative to arrive on time for appointment to keep from having the test rescheduled.  If you need to cancel or reschedule your appointment, please call the office within 24 hours of your appointment. . Patient verbalized understanding.Sarah Ray

## 2022-05-25 ENCOUNTER — Telehealth (HOSPITAL_COMMUNITY): Payer: Self-pay

## 2022-05-27 ENCOUNTER — Ambulatory Visit (HOSPITAL_COMMUNITY)
Admission: RE | Admit: 2022-05-27 | Discharge: 2022-05-27 | Disposition: A | Discharge status: Home / Self Care | Payer: PPO | Source: Ambulatory Visit | Attending: Physician Assistant | Admitting: Physician Assistant

## 2022-05-27 ENCOUNTER — Encounter (HOSPITAL_COMMUNITY): Payer: Self-pay | Admitting: Physician Assistant

## 2022-05-27 ENCOUNTER — Other Ambulatory Visit: Payer: Self-pay | Admitting: Gastroenterology

## 2022-05-27 VITALS — BP 128/78 | HR 91 | Ht 61.0 in | Wt 134.0 lb

## 2022-05-27 DIAGNOSIS — Z955 Presence of coronary angioplasty implant and graft: Secondary | ICD-10-CM | POA: Insufficient documentation

## 2022-05-27 DIAGNOSIS — Z79899 Other long term (current) drug therapy: Secondary | ICD-10-CM | POA: Diagnosis not present

## 2022-05-27 DIAGNOSIS — Z7901 Long term (current) use of anticoagulants: Secondary | ICD-10-CM | POA: Diagnosis not present

## 2022-05-27 DIAGNOSIS — D6869 Other thrombophilia: Secondary | ICD-10-CM | POA: Diagnosis not present

## 2022-05-27 DIAGNOSIS — I480 Paroxysmal atrial fibrillation: Secondary | ICD-10-CM | POA: Insufficient documentation

## 2022-05-27 DIAGNOSIS — I252 Old myocardial infarction: Secondary | ICD-10-CM | POA: Diagnosis not present

## 2022-05-27 DIAGNOSIS — I4510 Unspecified right bundle-branch block: Secondary | ICD-10-CM | POA: Diagnosis not present

## 2022-05-27 DIAGNOSIS — I10 Essential (primary) hypertension: Secondary | ICD-10-CM | POA: Diagnosis not present

## 2022-05-27 DIAGNOSIS — I25119 Atherosclerotic heart disease of native coronary artery with unspecified angina pectoris: Secondary | ICD-10-CM | POA: Insufficient documentation

## 2022-05-27 DIAGNOSIS — E785 Hyperlipidemia, unspecified: Secondary | ICD-10-CM | POA: Diagnosis not present

## 2022-05-27 NOTE — Progress Notes (Signed)
Primary Care Physician: Raina Mina., MD Primary Cardiologist: Dr Johnsie Cancel Primary Electrophysiologist: none Referring Physician: Dr Vernon Prey is a 81 y.o. female with a history of CAD, HTN, HLD, and new onset paroxysmal atrial fibrillation who presents for follow up in the [facility].  The patient was initially diagnosed with atrial fibrillation on 12/27/19 in cardiac rehab. She was asymptomatic but noted that her heart rate was elevated. ECG showed afib with RVR. Her metoprolol was increased and she was started on diltiazem. Patient has a CHADS2VASC score of 5. She denies snoring or alcohol use.   Patient started having symptoms of lightheadedness with an "unusual" feeling in her chest on 04/08/22. She noted her heart rate was elevated and called EMS which confirmed she was out of rhythm. She elected to not go to the ED and called [facility]. Her Eliquis was resumed and her metoprolol was increased which resolved her symptoms.   On follow up today, patient reports that over the past couple weeks she has not noticed her afib. She did have some "burning" in her shoulders and neck and is scheduled for a stress test and echo tomorrow. She is in rate controlled afib today and asymptomatic.   Today, she denies symptoms of palpitations, chest pain, shortness of breath, orthopnea, PND, lower extremity edema, dizziness, presyncope, syncope, snoring, daytime somnolence, bleeding, or neurologic sequela. The patient is tolerating medications without difficulties and is otherwise without complaint today.    Atrial Fibrillation Risk Factors:  she does not have symptoms or diagnosis of sleep apnea. she does not have a history of rheumatic fever. she does not have a history of alcohol use. The patient does have a history of early familial atrial fibrillation or other arrhythmias. Brother has afib and PPM.  she has a BMI of Body mass index is 25.32 kg/m.Marland Kitchen Filed  Weights   05/27/22 0949  Weight: 60.8 kg    Family History  Problem Relation Age of Onset   Colon polyps Mother    Colon cancer Maternal Aunt    Breast cancer Maternal Aunt        Age 69   Breast cancer Daughter        Age 18   Heart disease Brother        irregular heart beat   Esophageal cancer Neg Hx      Atrial Fibrillation Management history:  Previous antiarrhythmic drugs: none Previous cardioversions: none Previous ablations: none CHADS2VASC score: 5 Anticoagulation history: Eliquis   Past Medical History:  Diagnosis Date   Adenomatous polyps 07/1997   Breast fibroadenoma    CAD (coronary artery disease) 03/18/2014   STEMI- stent to RCA and nonobstructive disease LAD and LCX   External hemorrhoids    GERD (gastroesophageal reflux disease)    LA Class Grade C Erosive esophagitis   Heart attack ([place]) 2015   Hiatal hernia    HTN (hypertension)    Hyperlipidemia    Internal hemorrhoids    Osteopenia 04/2018   T score -1.9 FRAX 21% / 5%   Peptic stricture of esophagus    Skin cancer 2014   "forehead"   ST elevation myocardial infarction (STEMI) involving right coronary artery in recovery phase ([place]) 03/18/2014   Past Surgical History:  Procedure Laterality Date   ABDOMINAL HYSTERECTOMY  1977   Leiomyoma   BREAST CYST ASPIRATION Right ~ 2011   BUNIONECTOMY Bilateral 04/04/2013   CARDIAC CATHETERIZATION N/A 09/29/2016   Procedure:  Left Heart Cath and Coronary Angiography;  Surgeon: Belva Crome, MD;  Location: [facility];  Service: Cardiovascular;  Laterality: N/A;   CORONARY ANGIOPLASTY WITH STENT PLACEMENT  03/18/14   Xience DES to totally occl RCA, residual non obstructive disease to LAD and LCX   ESOPHAGOGASTRODUODENOSCOPY (EGD) WITH ESOPHAGEAL DILATION  2000's X 1   LEFT HEART CATH N/A 03/18/2014   Procedure: LEFT HEART CATH;  Surgeon: Blane Ohara, MD;  Location: [facility] CATH LAB;  Service: Cardiovascular;  Laterality: N/A;   LEFT HEART  CATHETERIZATION WITH CORONARY ANGIOGRAM N/A 04/22/2014   Procedure: LEFT HEART CATHETERIZATION WITH CORONARY ANGIOGRAM;  Surgeon: Blane Ohara, MD;  Location: [facility] CATH LAB;  Service: Cardiovascular;  Laterality: N/A;   SKIN CANCER EXCISION  2014   THYROID CYST EXCISION  1970's   TONSILLECTOMY AND ADENOIDECTOMY  ~ 1950    Current Outpatient Medications  Medication Sig Dispense Refill   apixaban (ELIQUIS) 5 MG TABS tablet Take 1 tablet (5 mg total) by mouth 2 (two) times daily. 60 tablet 6   CALCIUM-VITAMIN D PO Take 1 tablet by mouth 2 (two) times daily.      clopidogrel (PLAVIX) 75 MG tablet TAKE 1 TABLET(75 MG) BY MOUTH AT BEDTIME 90 tablet 3   estradiol (ESTRACE VAGINAL) 0.1 MG/GM vaginal cream Place 1 Applicatorful vaginally 3 (three) times a week. 42.5 g 12   famotidine (PEPCID) 20 MG tablet Take 20 mg by mouth daily.     metoprolol succinate (TOPROL-XL) 25 MG 24 hr tablet Take 1/2 tablet by mouth twice daily and make take 1 extra tablet for break through A-fib     nitroGLYCERIN (NITROSTAT) 0.4 MG SL tablet Place 1 tablet (0.4 mg total) under the tongue every 5 (five) minutes as needed for chest pain. 25 tablet 5   pantoprazole (PROTONIX) 40 MG tablet TAKE 1 TABLET(40 MG) BY MOUTH DAILY 90 tablet 0   rosuvastatin (CRESTOR) 5 MG tablet TAKE 1 TABLET BY MOUTH DAILY 5 DAYS A WEEK TO DAILY AS TOLERATED (Patient taking differently: Take 5 mg by mouth daily.) 30 tablet 9   vitamin B-12 (CYANOCOBALAMIN) 1000 MCG tablet SMARTSIG:1 Tablet(s) By Mouth     No current facility-administered medications for this encounter.    Allergies  Allergen Reactions   Naprosyn [Naproxen] Hives   Ticagrelor Shortness Of Breath and Other (See Comments)    Caused chest pains Caused chest pains   Aspirin Hives, Diarrhea and Swelling    Benadryl stops reaction   Atorvastatin Other (See Comments)    Myalgias and leg heaviness  Myalgias and leg heaviness   Other Other (See Comments)    Very sensitive to all  meds Very sensitive to all meds   Bempedoic Acid     Burning sensation   Gabapentin     dizziness   Praluent [Alirocumab]     Flu like symptoms    Social History   Socioeconomic History   Marital status: Married    Spouse name: [name]   Number of children: 2   Years of education: Associates   Highest education level: Not on file  Occupational History   Occupation: Dance movement psychotherapist  Tobacco Use   Smoking status: Never    Passive exposure: Never   Smokeless tobacco: Never   Tobacco comments:    Never smoke 04/09/22  Vaping Use   Vaping Use: Never used  Substance and Sexual Activity   Alcohol use: No    Alcohol/week: 0.0 standard drinks of  alcohol   Drug use: No   Sexual activity: Not Currently    Birth control/protection: Surgical, Post-menopausal    Comment: HYST-1st intercourse 81 yo-Fewer than 5 partners  Other Topics Concern   Not on file  Social History Narrative   Lives with husband   Daily caffeine:  1 cup per day (coffee or tea)   Social Determinants of Health   Financial Resource Strain: Not on file  Food Insecurity: Not on file  Transportation Needs: Not on file  Physical Activity: Not on file  Stress: Not on file  Social Connections: Not on file  Intimate Partner Violence: Not on file     ROS- All systems are reviewed and negative except as per the HPI above.  Physical Exam: Vitals:   05/27/22 0949  BP: 128/78  Pulse: 91  Weight: 60.8 kg  Height: '5\' 1"'$  (1.549 m)     GEN- The patient is a well appearing elderly female, alert and oriented x 3 today.   HEENT-head normocephalic, atraumatic, sclera clear, conjunctiva pink, hearing intact, trachea midline. Lungs- Clear to ausculation bilaterally, normal work of breathing Heart- irregular rate and rhythm, no murmurs, rubs or gallops  GI- soft, NT, ND, + BS Extremities- no clubbing, cyanosis, or edema MS- no significant deformity or atrophy Skin- no rash or lesion Psych- euthymic mood, full  affect Neuro- strength and sensation are intact   Wt Readings from Last 3 Encounters:  05/27/22 60.8 kg  05/18/22 60.9 kg  04/09/22 62.8 kg    EKG today demonstrates  Afib, RBBB Vent. rate 91 BPM PR interval * ms QRS duration 120 ms QT/QTcB 370/455 ms  Echo 05/04/18 demonstrated  - Left ventricle: The cavity size was normal. Systolic function was    normal. The estimated ejection fraction was in the range of 55%    to 60%. Wall motion was normal; there were no regional wall    motion abnormalities. Left ventricular diastolic function    parameters were normal.  - Aortic valve: There was trivial regurgitation.  - Mitral valve: Valve area by pressure half-time: 1.72 cm^2.  - Left atrium: The atrium was mildly dilated.  Epic records are reviewed at length today  CHA2DS2-VASc Score = 5  The patient's score is based upon: CHF History: 0 HTN History: 1 Diabetes History: 0 Stroke History: 0 Vascular Disease History: 1 Age Score: 2 Gender Score: 1       ASSESSMENT AND PLAN: 1. Paroxysmal Atrial Fibrillation (ICD10:  I48.0) The patient's CHA2DS2-VASc score is 5, indicating a 7.2% annual risk of stroke.   Patient in afib today, asymptomatic. We discussed rhythm control options again today. She has sensitivities to many medications and is anxious about starting an antiarrhythmic. We specifically discussed Multaq, dofetilide, amiodarone, and ablation. She is happy with her present therapy and does not want to make any changes at this time.  Continue metoprolol 12.5 mg BID Continue Eliquis 5 mg BID (weight > 60 kg and Cr is < 1.5)  2. Secondary Hypercoagulable State (ICD10:  D68.69) The patient is at significant risk for stroke/thromboembolism based upon her CHA2DS2-VASc Score of 5.  Continue Apixaban (Eliquis).   3. CAD S/p MI 2015 with PCI to RCA. Patient scheduled for stress test tomorrow.  4. HTN Stable, no change today.   Follow up with Ambrose Pancoast as scheduled. AF  clinic in 3 months.    [facility] [address] [place], [place] [address] [phone] 05/27/2022 9:59  AM

## 2022-05-28 ENCOUNTER — Ambulatory Visit (HOSPITAL_COMMUNITY): Payer: PPO | Attending: Nurse Practitioner

## 2022-05-28 ENCOUNTER — Ambulatory Visit (HOSPITAL_BASED_OUTPATIENT_CLINIC_OR_DEPARTMENT_OTHER): Payer: PPO

## 2022-05-28 DIAGNOSIS — R072 Precordial pain: Secondary | ICD-10-CM

## 2022-05-28 DIAGNOSIS — I480 Paroxysmal atrial fibrillation: Secondary | ICD-10-CM | POA: Insufficient documentation

## 2022-05-28 LAB — MYOCARDIAL PERFUSION IMAGING
Angina Index: 0
Duke Treadmill Score: 9
Estimated workload: 10.1
Exercise duration (min): 9 min
Exercise duration (sec): 0 s
LV dias vol: 69 mL (ref 46–106)
LV sys vol: 25 mL
MPHR: 137 {beats}/min
Nuc Stress EF: 63 %
Peak HR: 141 {beats}/min
Percent HR: 102 %
Rest HR: 61 {beats}/min
Rest Nuclear Isotope Dose: 10.7 mCi
SDS: 1
SRS: 0
SSS: 1
ST Depression (mm): 0 mm
Stress Nuclear Isotope Dose: 30.7 mCi
TID: 0.97

## 2022-05-28 LAB — ECHOCARDIOGRAM COMPLETE
Area-P 1/2: 4.89 cm2
Height: 61 in
P 1/2 time: 717 msec
S' Lateral: 3.2 cm
Weight: 2144 oz

## 2022-05-28 MED ORDER — TECHNETIUM TC 99M TETROFOSMIN IV KIT
30.7000 | PACK | Freq: Once | INTRAVENOUS | Status: AC | PRN
  Administered 2022-05-28: 30.7 via INTRAVENOUS

## 2022-05-28 MED ORDER — TECHNETIUM TC 99M TETROFOSMIN IV KIT
10.7000 | PACK | Freq: Once | INTRAVENOUS | Status: AC | PRN
  Administered 2022-05-28: 10.7 via INTRAVENOUS

## 2022-05-31 ENCOUNTER — Telehealth: Payer: Self-pay | Admitting: Gastroenterology

## 2022-05-31 MED ORDER — PANTOPRAZOLE SODIUM 40 MG PO TBEC: DELAYED_RELEASE_TABLET | ORAL | 0 refills | Status: DC

## 2022-05-31 NOTE — Telephone Encounter (Signed)
Inbound call from patient stating that she needed to make an appointment so she could get a refill on Protonix. Patient is scheduled to see Dr. Fuller Plan on 11/7 at 9:10 and is requesting refill to be sent. Please advise.

## 2022-05-31 NOTE — Telephone Encounter (Signed)
Prescription sent to patient's pharmacy until scheduled appt. 

## 2022-06-07 ENCOUNTER — Other Ambulatory Visit: Payer: Self-pay | Admitting: Cardiovascular Disease

## 2022-06-21 ENCOUNTER — Ambulatory Visit (HOSPITAL_COMMUNITY): Payer: PPO | Admitting: Physician Assistant

## 2022-06-21 NOTE — Progress Notes (Signed)
Office Visit    Patient Name: Sarah Ray Date of Encounter: 06/22/2022  Primary Care Provider:  Raina Mina., MD Primary Cardiologist:  Jenkins Rouge, MD Primary Electrophysiologist: None  Chief Complaint    Sarah Ray is a 81 y.o. female with PMH of CAD s/p STEMI 02/2014 treated with DES x1 to RCA, repeat cath treated with stents in 04/2014, HTN, HLD who presents today for follow-up of chest discomfort.  Past Medical History    Past Medical History:  Diagnosis Date   Adenomatous polyps 07/1997   Breast fibroadenoma    CAD (coronary artery disease) 03/18/2014   STEMI- stent to RCA and nonobstructive disease LAD and LCX   External hemorrhoids    GERD (gastroesophageal reflux disease)    LA Class Grade C Erosive esophagitis   Heart attack ([place]) 2015   Hiatal hernia    HTN (hypertension)    Hyperlipidemia    Internal hemorrhoids    Osteopenia 04/2018   T score -1.9 FRAX 21% / 5%   Peptic stricture of esophagus    Skin cancer 2014   "forehead"   ST elevation myocardial infarction (STEMI) involving right coronary artery in recovery phase ([place]) 03/18/2014   Past Surgical History:  Procedure Laterality Date   ABDOMINAL HYSTERECTOMY  1977   Leiomyoma   BREAST CYST ASPIRATION Right ~ 2011   BUNIONECTOMY Bilateral 04/04/2013   CARDIAC CATHETERIZATION N/A 09/29/2016   Procedure: Left Heart Cath and Coronary Angiography;  Surgeon: Belva Crome, MD;  Location: [facility];  Service: Cardiovascular;  Laterality: N/A;   CORONARY ANGIOPLASTY WITH STENT PLACEMENT  03/18/14   Xience DES to totally occl RCA, residual non obstructive disease to LAD and LCX   ESOPHAGOGASTRODUODENOSCOPY (EGD) WITH ESOPHAGEAL DILATION  2000's X 1   LEFT HEART CATH N/A 03/18/2014   Procedure: LEFT HEART CATH;  Surgeon: Blane Ohara, MD;  Location: [facility] CATH LAB;  Service: Cardiovascular;  Laterality: N/A;   LEFT HEART CATHETERIZATION WITH CORONARY ANGIOGRAM N/A 04/22/2014   Procedure: LEFT HEART  CATHETERIZATION WITH CORONARY ANGIOGRAM;  Surgeon: Blane Ohara, MD;  Location: [facility] CATH LAB;  Service: Cardiovascular;  Laterality: N/A;   SKIN CANCER EXCISION  2014   THYROID CYST EXCISION  1970's   TONSILLECTOMY AND ADENOIDECTOMY  ~ 1950    Allergies  Allergies  Allergen Reactions   Naprosyn [Naproxen] Hives   Ticagrelor Shortness Of Breath and Other (See Comments)    Caused chest pains Caused chest pains   Aspirin Hives, Diarrhea and Swelling    Benadryl stops reaction   Atorvastatin Other (See Comments)    Myalgias and leg heaviness  Myalgias and leg heaviness   Other Other (See Comments)    Very sensitive to all meds Very sensitive to all meds   Bempedoic Acid     Burning sensation   Gabapentin     dizziness   Praluent [Alirocumab]     Flu like symptoms    History of Present Illness    Sarah Ray is a 81 year old female with the above-mentioned past medical history who presents today with complaint of burning chest pain.  Sarah Ray was initially seen in 2015 with STEMI that was treated with DES x1 to RCA.  She was subsequently readmitted 2 months later with complaint of recurrent chest pain and found to be related to cervical stenosis.  She developed new onset paroxysmal A-fib in cardiac rehab in 2021 with elevated heart rate that was  managed with beta-blocker and Cardizem.  She was started on Eliquis and developed side effects with request to come off anticoagulation.  She was educated about the risk of reoccurring stroke with discontinued NOAC. She was last seen by Dr. Johnsie Cancel on 08/2021 for follow-up.  She had no reports of chest pain at that time and was currently on Praluent and Zetia.  She was seen in the atrial fibrillation clinic on 03/2022 bout of elevated heart rate.  She elected to come to heart care instead of going to the ED and Eliquis was resumed and metoprolol increased.  She declined antiarrhythmic medications and ablation at that time.  She was seen on  05/18/2022 for complaint of chest burning in the chest traveling through her back.  During her visit patient's blood pressure was well controlled and she did not require any nitroglycerin for discomfort.  She was concerned because pain was similar to previous discomfort felt when requiring PCI.  She was sent for [facility] for further evaluation that revealed no evidence of ischemia or infarct with similar findings of previous [place] in 2016.  Patient was also sent for repeat 2D echo that showed normal EF with no valvular abnormalities relating to to possible shortness of breath.  She was advised to take extra half tablet of Toprol-XL 25 mg  Sarah Ray presents today for 1 month follow-up with her husband.  Since last being seen in the office patient reports she been doing better with no reports of chest pain or shortness of breath.  She is tolerating her reduction and Toprol and heart rate with blood pressures are well controlled at home.  In the office today blood pressures are elevated at 140/84 and were 152/82 on recheck.  She is compliant with her current medications and denies any adverse reactions.  She is still currently quite active and exercises 5 days a week with cardiac rehab.  During our visit we reviewed her echo and exercise stress test results.  Patient had all questions answered to her satisfaction.  She is also following a heart healthy diet but does admit to eating sweets.  Patient denies chest pain, palpitations, dyspnea, PND, orthopnea, nausea, vomiting, dizziness, syncope, edema, weight gain, or early satiety.    Home Medications    Current Outpatient Medications  Medication Sig Dispense Refill   apixaban (ELIQUIS) 5 MG TABS tablet Take 1 tablet (5 mg total) by mouth 2 (two) times daily. 60 tablet 6   CALCIUM-VITAMIN D PO Take 1 tablet by mouth 2 (two) times daily.      clopidogrel (PLAVIX) 75 MG tablet TAKE 1 TABLET(75 MG) BY MOUTH AT BEDTIME 90 tablet 3   estradiol (ESTRACE  VAGINAL) 0.1 MG/GM vaginal cream Place 1 Applicatorful vaginally 3 (three) times a week. 42.5 g 12   famotidine (PEPCID) 20 MG tablet Take 20 mg by mouth daily.     metoprolol succinate (TOPROL-XL) 25 MG 24 hr tablet Take 1/2 tablet by mouth twice daily and make take 1 extra tablet for break through A-fib     nitroGLYCERIN (NITROSTAT) 0.4 MG SL tablet Place 1 tablet (0.4 mg total) under the tongue every 5 (five) minutes as needed for chest pain. 25 tablet 5   pantoprazole (PROTONIX) 40 MG tablet TAKE 1 TABLET(40 MG) BY MOUTH DAILY 90 tablet 0   rosuvastatin (CRESTOR) 5 MG tablet TAKE 1 TABLET BY MOUTH DAILY 5 DAYS A WEEK TO DAILY AS TOLERATED (Patient taking differently: Take 5 mg by mouth daily.) 30 tablet  9   vitamin B-12 (CYANOCOBALAMIN) 1000 MCG tablet SMARTSIG:1 Tablet(s) By Mouth     No current facility-administered medications for this visit.     Review of Systems  Please see the history of present illness.     All other systems reviewed and are otherwise negative except as noted above.  Physical Exam    Wt Readings from Last 3 Encounters:  06/22/22 134 lb 3.2 oz (60.9 kg)  05/28/22 134 lb (60.8 kg)  05/27/22 134 lb (60.8 kg)   VS: Vitals:   06/22/22 1020 06/22/22 1241  BP: (!) 148/84 (!) 152/82  Pulse: 96   SpO2: 96%   ,Body mass index is 25.36 kg/m.  Constitutional:      Appearance: Healthy appearance. Not in distress.  Neck:     Vascular: JVD normal.  Pulmonary:     Effort: Pulmonary effort is normal.     Breath sounds: No wheezing. No rales. Diminished in the bases Cardiovascular:     Normal rate. Regular rhythm. Normal S1. Normal S2.      Murmurs: There is no murmur.  Edema:    Peripheral edema absent.  Abdominal:     Palpations: Abdomen is soft non tender. There is no hepatomegaly.  Skin:    General: Skin is warm and dry.  Neurological:     General: No focal deficit present.     Mental Status: Alert and oriented to person, place and time.     Cranial  Nerves: Cranial nerves are intact.  EKG/LABS/Other Studies Reviewed    ECG personally reviewed by me today -none completed today  Risk Assessment/Calculations:    CHA2DS2-VASc Score = 5   This indicates a 7.2% annual risk of stroke. The patient's score is based upon: CHF History: 0 HTN History: 1 Diabetes History: 0 Stroke History: 0 Vascular Disease History: 1 Age Score: 2 Gender Score: 1      Lab Results  Component Value Date   WBC 3.8 (L) 12/31/2019   HGB 13.4 12/31/2019   HCT 41.4 12/31/2019   MCV 102.7 (H) 12/31/2019   PLT 200 12/31/2019   Lab Results  Component Value Date   CREATININE 0.98 04/09/2022   BUN 13 04/09/2022   NA 137 04/09/2022   K 4.2 04/09/2022   CL 108 04/09/2022   CO2 23 04/09/2022   Lab Results  Component Value Date   ALT 12 01/16/2020   AST 19 01/16/2020   ALKPHOS 96 01/16/2020   BILITOT 0.7 01/16/2020   Lab Results  Component Value Date   CHOL 158 01/16/2020   HDL 55 01/16/2020   LDLCALC 84 01/16/2020   LDLDIRECT 112 (H) 06/15/2018   TRIG 105 01/16/2020   CHOLHDL 2.9 01/16/2020    Lab Results  Component Value Date   HGBA1C 5.8 (H) 03/18/2014    Assessment & Plan    1.  Coronary artery disease: -s/p STEMI 02/2014 treated with DES x1 to RCA, repeat cath treated with stents in 04/2014 -Lexiscan exercise Myoview and 2D echo completed with no abnormal findings -Today patient reports no chest pain or shortness of breath -Continue current GDMT with Plavix 75 mg, Crestor 5 mg, Toprol 12.5 mg daily  2.  Paroxysmal atrial fibrillation: -Patient currently sinus rhythm today with a rate of 64 -She is currently rate controlled with Toprol XL 12.5 mg twice daily -She reports no bleeding concerns or issues at this time -Continue Eliquis 5 mg twice daily -Dose appropriate based on renal function and patient's current weight  3.  Hypertension: -HYPERTENSION CONTROL Vitals:   06/22/22 1020 06/22/22 1241  BP: (!) 148/84 (!) 152/82     The patient's blood pressure is elevated above target today.  In order to address the patient's elevated BP: Blood pressure will be monitored at home to determine if medication changes need to be made.; Follow up with general cardiology has been recommended.      -Continue current antihypertensive regimen as noted above -We will continue to monitor blood pressures for now   4.  Hyperlipidemia: -Patient has a history of statin intolerance and is currently followed by lipid clinic.  -Continue Crestor 5 mg 5 days/week -Current LDL cholesterol is 40 well-controlled and less than goal of 70       Disposition: Follow-up with Jenkins Rouge, MD or APP in 12 months     Medication Adjustments/Labs and Tests Ordered: Current medicines are reviewed at length with the patient today.  Concerns regarding medicines are outlined above.   Signed, Mable Fill, Marissa Nestle, NP 06/22/2022, 12:42 PM [facility]  Note:  This document was prepared using Dragon voice recognition software and may include unintentional dictation errors.

## 2022-06-22 ENCOUNTER — Encounter: Payer: Self-pay | Admitting: Nurse Practitioner

## 2022-06-22 ENCOUNTER — Ambulatory Visit: Payer: PPO | Attending: Nurse Practitioner | Admitting: Nurse Practitioner

## 2022-06-22 VITALS — BP 152/82 | HR 96 | Ht 61.0 in | Wt 134.2 lb

## 2022-06-22 DIAGNOSIS — I2510 Atherosclerotic heart disease of native coronary artery without angina pectoris: Secondary | ICD-10-CM | POA: Diagnosis not present

## 2022-06-22 DIAGNOSIS — I480 Paroxysmal atrial fibrillation: Secondary | ICD-10-CM | POA: Diagnosis not present

## 2022-06-22 DIAGNOSIS — I10 Essential (primary) hypertension: Secondary | ICD-10-CM

## 2022-06-22 DIAGNOSIS — E785 Hyperlipidemia, unspecified: Secondary | ICD-10-CM | POA: Diagnosis not present

## 2022-06-22 NOTE — Patient Instructions (Signed)
Medication Instructions:   Your physician recommends that you continue on your current medications as directed. Please refer to the Current Medication list given to you today.   *If you need a refill on your cardiac medications before your next appointment, please call your pharmacy*   Lab Work:   None ordered  If you have labs (blood work) drawn today and your tests are completely normal, you will receive your results only by: [place] (if you have MyChart) OR A paper copy in the mail If you have any lab test that is abnormal or we need to change your treatment, we will call you to review the results.   Testing/Procedures:  None ordered.   Follow-Up: At [facility], you and your health needs are our priority.  As part of our continuing mission to provide you with exceptional heart care, we have created designated Provider Care Teams.  These Care Teams include your primary Cardiologist (physician) and Advanced Practice Providers (APPs -  Physician Assistants and Nurse Practitioners) who all work together to provide you with the care you need, when you need it.  We recommend signing up for the patient portal called "MyChart".  Sign up information is provided on this After Visit Summary.  MyChart is used to connect with patients for Virtual Visits (Telemedicine).  Patients are able to view lab/test results, encounter notes, upcoming appointments, etc.  Non-urgent messages can be sent to your provider as well.   To learn more about what you can do with MyChart, go to [email or web address].    Your next appointment:   1 year(s)  The format for your next appointment:   In Person  Provider:   Jenkins Rouge, MD     Other Instructions  Your physician wants you to follow-up in: 1 year with Dr. Johnsie Cancel.  You will receive a reminder letter in the mail two months in advance. If you don't receive a letter, please call our office to schedule the follow-up  appointment.   Important Information About Sugar

## 2022-07-15 ENCOUNTER — Ambulatory Visit: Payer: PPO | Admitting: Family

## 2022-07-15 ENCOUNTER — Encounter: Payer: Self-pay | Admitting: Family

## 2022-07-15 VITALS — BP 140/82 | HR 64 | Resp 12 | Ht 60.6 in | Wt 135.6 lb

## 2022-07-15 DIAGNOSIS — Z888 Allergy status to other drugs, medicaments and biological substances status: Secondary | ICD-10-CM

## 2022-07-15 NOTE — Patient Instructions (Addendum)
Aspirin allergy Recommend avoiding all NSAIDS (Non-steroidal anti-inflammatory drug) due to history of reactions with Naprosyn and Aspirin Continue to use acetaminophen as needed for pain.  Follow up as needed

## 2022-07-15 NOTE — Progress Notes (Addendum)
NEW PATIENT  Date of Service/Encounter:  07/15/22  Referring provider: Raina Mina., MD   Assessment:   Aspirin allergy    Seasonal Influenza Vaccine: yes   PPSV-23 Vaccine (CDC recommended for patients with persistent asthma 19-83yo): Yes  She has not received the RSV vaccine yet.  Plan/Recommendations:   Aspirin allergy  Recommend avoiding all NSAIDS (Non-steroidal anti-inflammatory drug) due to history of reactions with Naprosyn and Aspirin  Continue to use acetaminophen for pain.  Celebrex and Mobic would also be possible alternatives for treatment of pain.  Celebrex and Mobic do not have antiplatelet effect.  Follow up as needed    Subjective:   Sarah Ray is a 81 y.o. female presenting today for evaluation of  Chief Complaint  Patient presents with   Medication Reaction    Reaction to naprosyn/ aspirin    Sarah Ray has a history of the following: Patient Active Problem List   Diagnosis Date Noted   Paroxysmal atrial fibrillation ([place]) 12/31/2019   Secondary hypercoagulable state ([place]) 12/31/2019   Dyslipidemia, goal LDL below 70 05/23/2018   Chest pain, unspecified 05/04/2018   Syncope and collapse    Multiple thyroid nodules 12/10/2015   Lung nodule < 6cm on CT 06/11/2015   Chest pain with moderate risk for cardiac etiology 02/11/2015   HTN (hypertension)    Chest pain 04/22/2014   STEMI (ST elevation myocardial infarction) ([place]) 03/18/2014   UTI (urinary tract infection) 03/18/2014   History of allergy to aspirin- Hives, edema 03/18/2014   Acute MI, inferolateral wall, initial episode of care ([place]) 03/18/2014   Inferolateral myocardial infarction ([place]) 03/18/2014   CAD S/P percutaneous coronary angioplasty 03/18/2014   Osteopenia    Breast fibroadenoma    Hx of adenomatous colonic polyps 11/15/2007   Unspecified disorder of thyroid 11/15/2007   HYPERCHOLESTEROLEMIA 11/15/2007   INTERNAL HEMORRHOIDS 11/15/2007   HEMORRHOIDS, EXTERNAL  11/15/2007   GERD 11/15/2007   PEPTIC STRICTURE 11/15/2007   HIATAL HERNIA 11/15/2007    History obtained from: chart review and patient.  Sarah Ray was referred by Raina Mina., MD.     Sarah Ray is a 81 y.o. female presenting for an evaluation of aspirin and naprosyn allergy .   She reports that approximately 20 years ago she took Naprosyn and within 15 minutes she began having stomach cramps, diarrhea, itching/hives, and her lips swelled and look like fever blisters.  She took Benadryl and this immediately helped with her symptoms.  Then there was another time when she was doing work on her house on [employer] and her shoulder was hurting so she took an aspirin she developed the same symptoms, but not as severe.  She took Benadryl again and this helped with her symptoms.  She did not develop the fever blisters with aspirin.  She is interested in potentially being able to take aspirin so she can come off Plavix.  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  She now eats yogurt every day and cheese.  There has been 1 time where she had ice cream from dairy lands and got sick.    Otherwise, there is no history of other atopic diseases, including asthma, environmental allergies, stinging insect allergies, eczema, urticaria, or contact dermatitis. There is no significant infectious history. Vaccinations are up to date.    Past Medical History: Patient Active Problem List   Diagnosis Date Noted   Paroxysmal atrial  fibrillation ([place]) 12/31/2019   Secondary hypercoagulable state ([place]) 12/31/2019   Dyslipidemia, goal LDL below 70 05/23/2018   Chest pain, unspecified 05/04/2018   Syncope and collapse    Multiple thyroid nodules 12/10/2015   Lung nodule < 6cm on CT 06/11/2015   Chest pain with moderate risk for cardiac etiology 02/11/2015   HTN (hypertension)    Chest pain 04/22/2014   STEMI (ST elevation myocardial infarction) ([place]) 03/18/2014   UTI  (urinary tract infection) 03/18/2014   History of allergy to aspirin- Hives, edema 03/18/2014   Acute MI, inferolateral wall, initial episode of care ([place]) 03/18/2014   Inferolateral myocardial infarction ([place]) 03/18/2014   CAD S/P percutaneous coronary angioplasty 03/18/2014   Osteopenia    Breast fibroadenoma    Hx of adenomatous colonic polyps 11/15/2007   Unspecified disorder of thyroid 11/15/2007   HYPERCHOLESTEROLEMIA 11/15/2007   INTERNAL HEMORRHOIDS 11/15/2007   HEMORRHOIDS, EXTERNAL 11/15/2007   GERD 11/15/2007   PEPTIC STRICTURE 11/15/2007   HIATAL HERNIA 11/15/2007    Medication List:  Allergies as of 07/15/2022       Reactions   Naprosyn [naproxen] Hives   Ticagrelor Shortness Of Breath, Other (See Comments)   Caused chest pains Caused chest pains   Aspirin Hives, Diarrhea, Swelling   Benadryl stops reaction   Atorvastatin Other (See Comments)   Myalgias and leg heaviness Myalgias and leg heaviness   Other    Very sensitive to all meds Very sensitive to all meds   Bempedoic Acid    Burning sensation   Covid-19 (mrna) Vaccine Other (See Comments)   Developed A-Fib after injection   Gabapentin    dizziness   Praluent [alirocumab]    Flu like symptoms        Medication List        Accurate as of July 15, 2022 10:07 AM. If you have any questions, ask your nurse or doctor.          apixaban 5 MG Tabs tablet Commonly known as: Eliquis Take 1 tablet (5 mg total) by mouth 2 (two) times daily.   CALCIUM-VITAMIN D PO Take 1 tablet by mouth 2 (two) times daily.   clopidogrel 75 MG tablet Commonly known as: PLAVIX TAKE 1 TABLET(75 MG) BY MOUTH AT BEDTIME   cyanocobalamin 1000 MCG tablet Commonly known as: VITAMIN B12 SMARTSIG:1 Tablet(s) By Mouth   estradiol 0.1 MG/GM vaginal cream Commonly known as: ESTRACE VAGINAL Place 1 Applicatorful vaginally 3 (three) times a week.   famotidine 20 MG tablet Commonly known as: PEPCID Take 20 mg by  mouth daily.   metoprolol succinate 25 MG 24 hr tablet Commonly known as: TOPROL-XL Take 1/2 tablet by mouth twice daily and make take 1 extra tablet for break through A-fib   nitroGLYCERIN 0.4 MG SL tablet Commonly known as: NITROSTAT Place 1 tablet (0.4 mg total) under the tongue every 5 (five) minutes as needed for chest pain.   pantoprazole 40 MG tablet Commonly known as: PROTONIX TAKE 1 TABLET(40 MG) BY MOUTH DAILY   rosuvastatin 5 MG tablet Commonly known as: CRESTOR TAKE 1 TABLET BY MOUTH DAILY 5 DAYS A WEEK TO DAILY AS TOLERATED What changed:  how much to take how to take this when to take this additional instructions        Past Surgical History: Past Surgical History:  Procedure Laterality Date   ABDOMINAL HYSTERECTOMY  1977   Leiomyoma   BREAST CYST ASPIRATION Right ~ 2011   BUNIONECTOMY Bilateral 04/04/2013  CARDIAC CATHETERIZATION N/A 09/29/2016   Procedure: Left Heart Cath and Coronary Angiography;  Surgeon: Belva Crome, MD;  Location: [facility];  Service: Cardiovascular;  Laterality: N/A;   CORONARY ANGIOPLASTY WITH STENT PLACEMENT  03/18/14   Xience DES to totally occl RCA, residual non obstructive disease to LAD and LCX   ESOPHAGOGASTRODUODENOSCOPY (EGD) WITH ESOPHAGEAL DILATION  2000's X 1   LEFT HEART CATH N/A 03/18/2014   Procedure: LEFT HEART CATH;  Surgeon: Blane Ohara, MD;  Location: [facility] CATH LAB;  Service: Cardiovascular;  Laterality: N/A;   LEFT HEART CATHETERIZATION WITH CORONARY ANGIOGRAM N/A 04/22/2014   Procedure: LEFT HEART CATHETERIZATION WITH CORONARY ANGIOGRAM;  Surgeon: Blane Ohara, MD;  Location: [facility] CATH [facility];  Service: Cardiovascular;  Laterality: N/A;   SKIN CANCER EXCISION  2014   THYROID CYST EXCISION  1970's   TONSILLECTOMY AND ADENOIDECTOMY  ~ 1950     Family History: Family History  Problem Relation Age of Onset   Colon polyps Mother    Heart disease Brother        irregular heart beat   Colon cancer  Maternal Aunt    Breast cancer Maternal Aunt        Age 42   Asthma Paternal Grandmother    Breast cancer Daughter        Age 17   Esophageal cancer Neg Hx      Social History: Cherl lives at home with with her husband in a house that is 74 years old.  There is no water damage or mildew in the house.  She has laminate flooring in the family room and in the bedroom.  She has gas heating and central cooling.  There are 2 cats inside the house and no animals outside the house.  The cats do not sleep in the bedroom.  There are no roaches in the house and the bed is at least 2 feet off the floor.  She does have plastic or dust mite free covers on her bed and pillows.  She is not exposed to any tobacco or smoke in the house or in the car.  She is retired.  She does not have a job where she is exposed to fumes, chemicals, or dust and she does not have any hobbies where she is exposed to fumes, chemicals, or dust.  She does not have a HEPA filter in the house and she does not live near in interstate or industrial area.  She has never smoked.    Review of Systems  Constitutional:  Negative for chills and fever.  HENT:         Reports clear runny nose and denies nasal congestion and postnasal drip.  She does use saline spray twice a day  Eyes:        Denies itchy watery eyes  Respiratory:  Negative for cough, shortness of breath and wheezing.   Cardiovascular:        Reports pain in chest and back which she attributes to reflux.  She is currently followed by cardiology  Gastrointestinal:        Denies heartburn or reflux symptoms. She is currently on pantoprazole once a day and Pepcid once a day.  She has had her esophagus stretched.  Genitourinary:  Negative for frequency.  Skin:  Negative for itching and rash.       Reports itching due to dry skin for which lotion helps  Neurological:  Positive for headaches.  Objective:   Blood pressure (!) 140/82, pulse 64, resp. rate 12, height 5'  0.6" (1.539 m), weight 135 lb 9.6 oz (61.5 kg), SpO2 95 %. Body mass index is 25.96 kg/m.   Physical Exam:   Physical Exam Exam conducted with a chaperone present.  Constitutional:      Appearance: Normal appearance.  HENT:     Head: Normocephalic and atraumatic.     Comments: Pharynx normal. Eyes normal. Ears normal. Nose normal.    Right Ear: Tympanic membrane, ear canal and external ear normal.     Left Ear: Tympanic membrane, ear canal and external ear normal.     Nose: Nose normal.     Mouth/Throat:     Mouth: Mucous membranes are moist.     Pharynx: Oropharynx is clear.  Eyes:     Conjunctiva/sclera: Conjunctivae normal.  Cardiovascular:     Rate and Rhythm: Normal rate and regular rhythm.     Heart sounds: Normal heart sounds.  Pulmonary:     Effort: Pulmonary effort is normal.     Breath sounds: Normal breath sounds.     Comments: Lungs clear to auscultation Musculoskeletal:     Cervical back: Neck supple.  Skin:    General: Skin is warm.  Neurological:     Mental Status: She is alert and oriented to person, place, and time.  Psychiatric:        Mood and Affect: Mood normal.        Behavior: Behavior normal.        Thought Content: Thought content normal.        Judgment: Judgment normal.      Diagnostic studies: none   Allergy Studies: none    Althea Charon, FNP Allergy and [facility]  In I have provided oversight concerning NP evaluation and treatment of this patient's health issues addressed during today's encounter. I agree with the assessment and therapeutic plan as outlined in the note.   Signed,   Jiles Prows, MD,  Allergy and Immunology,  [facility].

## 2022-07-27 ENCOUNTER — Encounter: Payer: Self-pay | Admitting: Gastroenterology

## 2022-07-27 ENCOUNTER — Ambulatory Visit: Payer: PPO | Admitting: Gastroenterology

## 2022-07-27 VITALS — BP 120/80 | HR 65 | Ht 61.0 in | Wt 135.2 lb

## 2022-07-27 DIAGNOSIS — Z86010 Personal history of colonic polyps: Secondary | ICD-10-CM

## 2022-07-27 DIAGNOSIS — Z860101 Personal history of adenomatous and serrated colon polyps: Secondary | ICD-10-CM

## 2022-07-27 DIAGNOSIS — K219 Gastro-esophageal reflux disease without esophagitis: Secondary | ICD-10-CM

## 2022-07-27 MED ORDER — PANTOPRAZOLE SODIUM 40 MG PO TBEC: DELAYED_RELEASE_TABLET | ORAL | 3 refills | Status: DC

## 2022-07-27 NOTE — Patient Instructions (Signed)
We have sent the following medications to your pharmacy for you to pick up at your convenience: Pantoprazole   Due to recent changes in healthcare laws, you may see the results of your imaging and laboratory studies on MyChart before your provider has had a chance to review them.  We understand that in some cases there may be results that are confusing or concerning to you. Not all laboratory results come back in the same time frame and the provider may be waiting for multiple results in order to interpret others.  Please give [place] 48 hours in order for your provider to thoroughly review all the results before contacting the office for clarification of your results.    Thank you for choosing me and [facility].  [name]. Dagoberto Ligas., MD., [facility]

## 2022-07-27 NOTE — Progress Notes (Signed)
    Assessment     GERD Duodenal AVM Personal history of adenomatous colon polyps - no longer in surveillance due to age Afib on Eliquis   Recommendations    Follow antireflux measures, continue pantoprazole 40 mg p.o. every morning and famotidine 20 mg p.o. every evening REV in 1 year   HPI    This is an 81 year old female returning for follow-up of GERD.  She relates her symptoms flared up few months ago but have now come under better control after adding famotidine 20 mg in the evening.  No other gastrointestinal complaints.   Labs / Imaging       Latest Ref Rng & Units 01/16/2020    9:08 AM 07/02/2019    8:15 AM 09/04/2018    9:23 AM  Hepatic Function  Total Protein 6.0 - 8.5 g/dL 6.5  6.2  6.3   Albumin 3.7 - 4.7 g/dL 4.3  4.2  4.3   AST 0 - 40 IU/L _0 ALT 0 - 32 IU/L _1 Alk Phosphatase 39 - 117 IU/L 96  71  78   Total Bilirubin 0.0 - 1.2 mg/dL 0.7  0.7  0.4   Bilirubin, Direct 0.00 - 0.40 mg/dL 0.20  0.19  0.12        Latest Ref Rng & Units 12/31/2019   10:15 AM 05/04/2018   12:39 AM 05/04/2018   12:22 AM  CBC  WBC 4.0 - 10.5 K/uL 3.8   4.9   Hemoglobin 12.0 - 15.0 g/dL 13.4  12.9  13.5   Hematocrit 36.0 - 46.0 % 41.4  38.0  40.9   Platelets 150 - 400 K/uL 200   198     Current Medications, Allergies, Past Medical History, Past Surgical History, Family History and Social History were reviewed in [employer] record.   Physical Exam: General: Well developed, well nourished, no acute distress Head: Normocephalic and atraumatic Eyes: Sclerae anicteric, EOMI Ears: Normal auditory acuity Mouth: Not examined Lungs: Clear throughout to auscultation Heart: Regular rate and rhythm; no murmurs, rubs or bruits Abdomen: Soft, non tender and non distended. No masses, hepatosplenomegaly or hernias noted. Normal Bowel sounds Rectal: Not done Musculoskeletal: Symmetrical with no gross deformities  Pulses:  Normal pulses  noted Extremities: No clubbing, cyanosis, edema or deformities noted Neurological: Alert oriented x 4, grossly nonfocal Psychological:  Alert and cooperative. Normal mood and affect   Gael Londo T. Fuller Plan, MD 07/27/2022, 8:53 AM

## 2022-08-14 ENCOUNTER — Other Ambulatory Visit: Payer: Self-pay | Admitting: Cardiovascular Disease

## 2022-08-18 MED ORDER — ROSUVASTATIN CALCIUM 5 MG PO TABS: ORAL_TABLET | ORAL | 11 refills | Status: DC

## 2022-08-26 ENCOUNTER — Encounter (HOSPITAL_COMMUNITY): Payer: Self-pay | Admitting: Physician Assistant

## 2022-08-26 ENCOUNTER — Ambulatory Visit (HOSPITAL_COMMUNITY)
Admission: RE | Admit: 2022-08-26 | Discharge: 2022-08-26 | Disposition: A | Discharge status: Home / Self Care | Payer: PPO | Source: Ambulatory Visit | Attending: Physician Assistant | Admitting: Physician Assistant

## 2022-08-26 VITALS — BP 144/90 | HR 58 | Ht 61.0 in | Wt 136.8 lb

## 2022-08-26 DIAGNOSIS — Z8249 Family history of ischemic heart disease and other diseases of the circulatory system: Secondary | ICD-10-CM | POA: Diagnosis not present

## 2022-08-26 DIAGNOSIS — Z79899 Other long term (current) drug therapy: Secondary | ICD-10-CM | POA: Insufficient documentation

## 2022-08-26 DIAGNOSIS — E785 Hyperlipidemia, unspecified: Secondary | ICD-10-CM | POA: Insufficient documentation

## 2022-08-26 DIAGNOSIS — D6869 Other thrombophilia: Secondary | ICD-10-CM | POA: Diagnosis not present

## 2022-08-26 DIAGNOSIS — I10 Essential (primary) hypertension: Secondary | ICD-10-CM | POA: Diagnosis not present

## 2022-08-26 DIAGNOSIS — Z7901 Long term (current) use of anticoagulants: Secondary | ICD-10-CM | POA: Insufficient documentation

## 2022-08-26 DIAGNOSIS — I2510 Atherosclerotic heart disease of native coronary artery without angina pectoris: Secondary | ICD-10-CM | POA: Insufficient documentation

## 2022-08-26 DIAGNOSIS — I252 Old myocardial infarction: Secondary | ICD-10-CM | POA: Insufficient documentation

## 2022-08-26 DIAGNOSIS — I480 Paroxysmal atrial fibrillation: Secondary | ICD-10-CM | POA: Diagnosis present

## 2022-08-26 NOTE — Progress Notes (Signed)
Primary Care Physician: Raina Mina., MD Primary Cardiologist: Dr Johnsie Cancel Primary Electrophysiologist: none Referring Physician: Dr Vernon Prey is a 81 y.o. female with a history of CAD, HTN, HLD, and new onset paroxysmal atrial fibrillation who presents for follow up in the [facility].  The patient was initially diagnosed with atrial fibrillation on 12/27/19 in cardiac rehab. She was asymptomatic but noted that her heart rate was elevated. ECG showed afib with RVR. Her metoprolol was increased and she was started on diltiazem. Patient has a CHADS2VASC score of 5. She denies snoring or alcohol use.   Patient started having symptoms of lightheadedness with an "unusual" feeling in her chest on 04/08/22. She noted her heart rate was elevated and called EMS which confirmed she was out of rhythm. She elected to not go to the ED and called [facility]. Her Eliquis was resumed and her metoprolol was increased which resolved her symptoms.   On follow up today, patient reports that she has done well since her last visit. She has purchased a Electrical engineer which has shown only SR. No bleeding issues on anticoagulation.   Today, she denies symptoms of palpitations, chest pain, shortness of breath, orthopnea, PND, lower extremity edema, dizziness, presyncope, syncope, snoring, daytime somnolence, bleeding, or neurologic sequela. The patient is tolerating medications without difficulties and is otherwise without complaint today.    Atrial Fibrillation Risk Factors:  she does not have symptoms or diagnosis of sleep apnea. she does not have a history of rheumatic fever. she does not have a history of alcohol use. The patient does have a history of early familial atrial fibrillation or other arrhythmias. Brother has afib and PPM.  she has a BMI of Body mass index is 25.85 kg/m.Marland Kitchen Filed Weights   08/26/22 0938  Weight: 62.1 kg    Family History  Problem Relation Age  of Onset   Colon polyps Mother    Heart disease Brother        irregular heart beat   Colon cancer Maternal Aunt    Breast cancer Maternal Aunt        Age 64   Asthma Paternal Grandmother    Breast cancer Daughter        Age 41   Esophageal cancer Neg Hx      Atrial Fibrillation Management history:  Previous antiarrhythmic drugs: none Previous cardioversions: none Previous ablations: none CHADS2VASC score: 5 Anticoagulation history: Eliquis   Past Medical History:  Diagnosis Date   Adenomatous polyps 07/1997   Breast fibroadenoma    CAD (coronary artery disease) 03/18/2014   STEMI- stent to RCA and nonobstructive disease LAD and LCX   External hemorrhoids    GERD (gastroesophageal reflux disease)    LA Class Grade C Erosive esophagitis   Heart attack ([place]) 2015   Hiatal hernia    HTN (hypertension)    Hyperlipidemia    Internal hemorrhoids    Osteopenia 04/2018   T score -1.9 FRAX 21% / 5%   Peptic stricture of esophagus    Skin cancer 2014   "forehead"   ST elevation myocardial infarction (STEMI) involving right coronary artery in recovery phase ([place]) 03/18/2014   Past Surgical History:  Procedure Laterality Date   ABDOMINAL HYSTERECTOMY  1977   Leiomyoma   BREAST CYST ASPIRATION Right ~ 2011   BUNIONECTOMY Bilateral 04/04/2013   CARDIAC CATHETERIZATION N/A 09/29/2016   Procedure: Left Heart Cath and Coronary Angiography;  Surgeon: Lynnell Dike  Tamala Julian, MD;  Location: [facility];  Service: Cardiovascular;  Laterality: N/A;   CORONARY ANGIOPLASTY WITH STENT PLACEMENT  03/18/14   Xience DES to totally occl RCA, residual non obstructive disease to LAD and LCX   ESOPHAGOGASTRODUODENOSCOPY (EGD) WITH ESOPHAGEAL DILATION  2000's X 1   LEFT HEART CATH N/A 03/18/2014   Procedure: LEFT HEART CATH;  Surgeon: Blane Ohara, MD;  Location: [facility] CATH LAB;  Service: Cardiovascular;  Laterality: N/A;   LEFT HEART CATHETERIZATION WITH CORONARY ANGIOGRAM N/A 04/22/2014    Procedure: LEFT HEART CATHETERIZATION WITH CORONARY ANGIOGRAM;  Surgeon: Blane Ohara, MD;  Location: [facility] CATH LAB;  Service: Cardiovascular;  Laterality: N/A;   SKIN CANCER EXCISION  2014   THYROID CYST EXCISION  1970's   TONSILLECTOMY AND ADENOIDECTOMY  ~ 1950    Current Outpatient Medications  Medication Sig Dispense Refill   apixaban (ELIQUIS) 5 MG TABS tablet Take 1 tablet (5 mg total) by mouth 2 (two) times daily. 60 tablet 6   CALCIUM-VITAMIN D PO Take 1 tablet by mouth 2 (two) times daily.      clopidogrel (PLAVIX) 75 MG tablet TAKE 1 TABLET(75 MG) BY MOUTH AT BEDTIME 90 tablet 3   estradiol (ESTRACE VAGINAL) 0.1 MG/GM vaginal cream Place 1 Applicatorful vaginally 3 (three) times a week. 42.5 g 12   famotidine (PEPCID) 20 MG tablet Take 20 mg by mouth daily.     metoprolol succinate (TOPROL-XL) 25 MG 24 hr tablet Take 1/2 tablet by mouth twice daily and make take 1 extra tablet for break through A-fib     nitroGLYCERIN (NITROSTAT) 0.4 MG SL tablet Place 1 tablet (0.4 mg total) under the tongue every 5 (five) minutes as needed for chest pain. 25 tablet 5   pantoprazole (PROTONIX) 40 MG tablet TAKE 1 TABLET(40 MG) BY MOUTH DAILY 90 tablet 3   rosuvastatin (CRESTOR) 5 MG tablet TAKE 1 TABLET BY MOUTH DAILY 5 DAYS A WEEK TO DAILY AS TOLERATED (Patient taking differently: TAKE 1 TABLET BY MOUTH DAILY 7 DAYS A WEEK TO DAILY AS TOLERATED) 30 tablet 11   vitamin B-12 (CYANOCOBALAMIN) 1000 MCG tablet SMARTSIG:1 Tablet(s) By Mouth     No current facility-administered medications for this encounter.    Allergies  Allergen Reactions   Naprosyn [Naproxen] Hives   Ticagrelor Shortness Of Breath and Other (See Comments)    Caused chest pains Caused chest pains   Aspirin Hives, Diarrhea and Swelling    Benadryl stops reaction   Atorvastatin Other (See Comments)    Myalgias and leg heaviness  Myalgias and leg heaviness   Other     Very sensitive to all meds Very sensitive to all meds    Bempedoic Acid     Burning sensation   Covid-19 (Mrna) Vaccine Other (See Comments)    Developed A-Fib after injection   Gabapentin     dizziness   Praluent [Alirocumab]     Flu like symptoms    Social History   Socioeconomic History   Marital status: Married    Spouse name: [name]   Number of children: 2   Years of education: Associates   Highest education level: Not on file  Occupational History   Occupation: Dance movement psychotherapist  Tobacco Use   Smoking status: Never    Passive exposure: Never   Smokeless tobacco: Never   Tobacco comments:    Never smoke 04/09/22  Vaping Use   Vaping Use: Never used  Substance and Sexual Activity  Alcohol use: No    Alcohol/week: 0.0 standard drinks of alcohol   Drug use: No   Sexual activity: Not Currently    Birth control/protection: Surgical, Post-menopausal    Comment: HYST-1st intercourse 81 yo-Fewer than 5 partners  Other Topics Concern   Not on file  Social History Narrative   Lives with husband   Daily caffeine:  1 cup per day (coffee or tea)   Social Determinants of Health   Financial Resource Strain: Not on file  Food Insecurity: Not on file  Transportation Needs: Not on file  Physical Activity: Not on file  Stress: Not on file  Social Connections: Not on file  Intimate Partner Violence: Not on file     ROS- All systems are reviewed and negative except as per the HPI above.  Physical Exam: Vitals:   08/26/22 0938  BP: (!) 144/90  Pulse: (!) 58  Weight: 62.1 kg  Height: '5\' 1"'$  (1.549 m)     GEN- The patient is a well appearing elderly female, alert and oriented x 3 today.   HEENT-head normocephalic, atraumatic, sclera clear, conjunctiva pink, hearing intact, trachea midline. Lungs- Clear to ausculation bilaterally, normal work of breathing Heart- Regular rate and rhythm, no murmurs, rubs or gallops  GI- soft, NT, ND, + BS Extremities- no clubbing, cyanosis, or edema MS- no significant deformity or  atrophy Skin- no rash or lesion Psych- euthymic mood, full affect Neuro- strength and sensation are intact   Wt Readings from Last 3 Encounters:  08/26/22 62.1 kg  07/27/22 61.3 kg  07/15/22 61.5 kg    EKG today demonstrates  SB Vent. rate 58 BPM PR interval 142 ms QRS duration 90 ms QT/QTcB 434/426 ms  Echo 05/28/22 demonstrated   1. Left ventricular ejection fraction, by estimation, is 55 to 60%. The  left ventricle has normal function. The left ventricle has no regional  wall motion abnormalities. Left ventricular diastolic parameters were  normal.   2. Right ventricular systolic function is normal. The right ventricular  size is normal. There is normal pulmonary artery systolic pressure.   3. Left atrial size was mildly dilated.   4. The mitral valve is normal in structure. Trivial mitral valve  regurgitation. No evidence of mitral stenosis.   5. The aortic valve is tricuspid. Aortic valve regurgitation is mild to  moderate. No aortic stenosis is present.   6. The inferior vena cava is normal in size with greater than 50%  respiratory variability, suggesting right atrial pressure of 3 mmHg.   Epic records are reviewed at length today  CHA2DS2-VASc Score = 5  The patient's score is based upon: CHF History: 0 HTN History: 1 Diabetes History: 0 Stroke History: 0 Vascular Disease History: 1 Age Score: 2 Gender Score: 1       ASSESSMENT AND PLAN: 1. Paroxysmal Atrial Fibrillation (ICD10:  I48.0) The patient's CHA2DS2-VASc score is 5, indicating a 7.2% annual risk of stroke.   Patient appears to be maintaining SR. Continue metoprolol 12.5 mg BID Continue Eliquis 5 mg BID (weight > 60 kg and Cr is < 1.5) Kardia mobile for home monitoring.   2. Secondary Hypercoagulable State (ICD10:  D68.69) The patient is at significant risk for stroke/thromboembolism based upon her CHA2DS2-VASc Score of 5.  Continue Apixaban (Eliquis).   3. CAD S/p MI 2015 with PCI to  RCA. Myoview 05/2022 low risk No anginal symptoms.  4. HTN Stable, no changes today.   Follow up in the AF clinic in  6 months.    [facility] [address] [place], [place] [address] [phone] 08/26/2022 10:01 AM

## 2022-10-22 ENCOUNTER — Other Ambulatory Visit: Payer: Self-pay | Admitting: Cardiovascular Disease

## 2022-10-22 NOTE — Telephone Encounter (Signed)
This is a A-Fib clinic pt 

## 2022-11-06 ENCOUNTER — Other Ambulatory Visit (HOSPITAL_COMMUNITY): Payer: Self-pay | Admitting: Physician Assistant

## 2023-02-22 ENCOUNTER — Ambulatory Visit (HOSPITAL_COMMUNITY)
Admission: RE | Admit: 2023-02-22 | Discharge: 2023-02-22 | Disposition: A | Discharge status: Home / Self Care | Payer: PPO | Source: Ambulatory Visit | Attending: Physician Assistant | Admitting: Physician Assistant

## 2023-02-22 VITALS — BP 142/70 | HR 64 | Ht 61.0 in | Wt 135.4 lb

## 2023-02-22 DIAGNOSIS — I2510 Atherosclerotic heart disease of native coronary artery without angina pectoris: Secondary | ICD-10-CM | POA: Insufficient documentation

## 2023-02-22 DIAGNOSIS — I10 Essential (primary) hypertension: Secondary | ICD-10-CM | POA: Insufficient documentation

## 2023-02-22 DIAGNOSIS — D6869 Other thrombophilia: Secondary | ICD-10-CM | POA: Insufficient documentation

## 2023-02-22 DIAGNOSIS — I480 Paroxysmal atrial fibrillation: Secondary | ICD-10-CM | POA: Insufficient documentation

## 2023-02-22 NOTE — Progress Notes (Signed)
Primary Care Physician: Gordan Payment., MD Primary Cardiologist: Dr Eden Emms Primary Electrophysiologist: none Referring Physician: Dr Demetrios Loll is a 82 y.o. female with a history of CAD, HTN, HLD, and new onset paroxysmal atrial fibrillation who presents for follow up in the [facility].  The patient was initially diagnosed with atrial fibrillation on 12/27/19 in cardiac rehab. She was asymptomatic but noted that her heart rate was elevated. ECG showed afib with RVR. Her metoprolol was increased and she was started on diltiazem. Patient has a CHADS2VASC score of 5. She denies snoring or alcohol use.   Patient started having symptoms of lightheadedness with an "unusual" feeling in her chest on 04/08/22. She noted her heart rate was elevated and called EMS which confirmed she was out of rhythm. She elected to not go to the ED and called [facility]. Her Eliquis was resumed and her metoprolol was increased which resolved her symptoms.   On follow up today, patient reports that she has had two brief episodes of palpitations in the past several months. Both were < 1 hour. There were no specific triggers that she could identify.   Today, she denies symptoms of palpitations, chest pain, shortness of breath, orthopnea, PND, lower extremity edema, dizziness, presyncope, syncope, snoring, daytime somnolence, bleeding, or neurologic sequela. The patient is tolerating medications without difficulties and is otherwise without complaint today.    Atrial Fibrillation Risk Factors:  she does not have symptoms or diagnosis of sleep apnea. she does not have a history of rheumatic fever. she does not have a history of alcohol use. The patient does have a history of early familial atrial fibrillation or other arrhythmias. Brother has afib and PPM.  she has a BMI of Body mass index is 25.58 kg/m.Marland Kitchen Filed Weights   02/22/23 1014  Weight: 61.4 kg   Family History  Problem  Relation Age of Onset   Colon polyps Mother    Heart disease Brother        irregular heart beat   Colon cancer Maternal Aunt    Breast cancer Maternal Aunt        Age 3   Asthma Paternal Grandmother    Breast cancer Daughter        Age 65   Esophageal cancer Neg Hx      Atrial Fibrillation Management history:  Previous antiarrhythmic drugs: none Previous cardioversions: none Previous ablations: none Anticoagulation history: Eliquis   Past Medical History:  Diagnosis Date   Adenomatous polyps 07/1997   Breast fibroadenoma    CAD (coronary artery disease) 03/18/2014   STEMI- stent to RCA and nonobstructive disease LAD and LCX   External hemorrhoids    GERD (gastroesophageal reflux disease)    LA Class Grade C Erosive esophagitis   Heart attack ([facility]) 2015   Hiatal hernia    HTN (hypertension)    Hyperlipidemia    Internal hemorrhoids    Osteopenia 04/2018   T score -1.9 FRAX 21% / 5%   Peptic stricture of esophagus    Skin cancer 2014   "forehead"   ST elevation myocardial infarction (STEMI) involving right coronary artery in recovery phase ([facility]) 03/18/2014   Past Surgical History:  Procedure Laterality Date   ABDOMINAL HYSTERECTOMY  1977   Leiomyoma   BREAST CYST ASPIRATION Right ~ 2011   BUNIONECTOMY Bilateral 04/04/2013   CARDIAC CATHETERIZATION N/A 09/29/2016   Procedure: Left Heart Cath and Coronary Angiography;  Surgeon: Lyn Records,  MD;  Location: [facility];  Service: Cardiovascular;  Laterality: N/A;   CORONARY ANGIOPLASTY WITH STENT PLACEMENT  03/18/14   Xience DES to totally occl RCA, residual non obstructive disease to LAD and LCX   ESOPHAGOGASTRODUODENOSCOPY (EGD) WITH ESOPHAGEAL DILATION  2000's X 1   LEFT HEART CATH N/A 03/18/2014   Procedure: LEFT HEART CATH;  Surgeon: Micheline Chapman, MD;  Location: [facility] LAB;  Service: Cardiovascular;  Laterality: N/A;   LEFT HEART CATHETERIZATION WITH CORONARY ANGIOGRAM N/A 04/22/2014   Procedure: LEFT  HEART CATHETERIZATION WITH CORONARY ANGIOGRAM;  Surgeon: Micheline Chapman, MD;  Location: [facility] CATH LAB;  Service: Cardiovascular;  Laterality: N/A;   SKIN CANCER EXCISION  2014   THYROID CYST EXCISION  1970's   TONSILLECTOMY AND ADENOIDECTOMY  ~ 1950    Current Outpatient Medications  Medication Sig Dispense Refill   apixaban (ELIQUIS) 5 MG TABS tablet TAKE 1 TABLET(5 MG) BY MOUTH TWICE DAILY 60 tablet 11   BIOTIN PO Take 1 tablet by mouth daily.     CALCIUM-VITAMIN D PO Take 1 tablet by mouth 2 (two) times daily.      clopidogrel (PLAVIX) 75 MG tablet TAKE 1 TABLET(75 MG) BY MOUTH AT BEDTIME 90 tablet 3   estradiol (ESTRACE VAGINAL) 0.1 MG/GM vaginal cream Place 1 Applicatorful vaginally 3 (three) times a week. (Patient taking differently: Place 1 Applicatorful vaginally every 30 (thirty) days.) 42.5 g 12   famotidine (PEPCID) 20 MG tablet Take 20 mg by mouth daily.     metoprolol succinate (TOPROL-XL) 25 MG 24 hr tablet TAKE 1 TABLET(25 MG) BY MOUTH DAILY WITH OR IMMEDIATELY FOLLOWING A MEAL (Patient taking differently: TAKE 1/2 TABLET BY MOUTH TWICE DAILY WITH OR IMMEDIATELY FOLLOWING A MEAL) 90 tablet 3   nitroGLYCERIN (NITROSTAT) 0.4 MG SL tablet Place 1 tablet (0.4 mg total) under the tongue every 5 (five) minutes as needed for chest pain. 25 tablet 5   pantoprazole (PROTONIX) 40 MG tablet TAKE 1 TABLET(40 MG) BY MOUTH DAILY 90 tablet 3   rosuvastatin (CRESTOR) 5 MG tablet TAKE 1 TABLET BY MOUTH DAILY 5 DAYS A WEEK TO DAILY AS TOLERATED (Patient taking differently: TAKE 1 TABLET BY MOUTH DAILY 7 DAYS A WEEK TO DAILY AS TOLERATED) 30 tablet 11   vitamin B-12 (CYANOCOBALAMIN) 1000 MCG tablet Take 1,000 mcg by mouth daily.     No current facility-administered medications for this encounter.    Allergies  Allergen Reactions   Naprosyn [Naproxen] Hives   Ticagrelor Shortness Of Breath and Other (See Comments)    Caused chest pains Caused chest pains   Aspirin Hives, Diarrhea and Swelling     Benadryl stops reaction   Atorvastatin Other (See Comments)    Myalgias and leg heaviness  Myalgias and leg heaviness   Other     Very sensitive to all meds Very sensitive to all meds   Bempedoic Acid     Burning sensation   Covid-19 (Mrna) Vaccine Other (See Comments)    Developed A-Fib after injection   Gabapentin     dizziness   Praluent [Alirocumab]     Flu like symptoms    Social History   Socioeconomic History   Marital status: Married    Spouse name: Reita Cliche   Number of children: 2   Years of education: Associates   Highest education level: Not on file  Occupational History   Occupation: Quarry manager  Tobacco Use   Smoking status: Never    Passive  exposure: Never   Smokeless tobacco: Never   Tobacco comments:    Never smoke 04/09/22  Vaping Use   Vaping Use: Never used  Substance and Sexual Activity   Alcohol use: No    Alcohol/week: 0.0 standard drinks of alcohol   Drug use: No   Sexual activity: Not Currently    Birth control/protection: Surgical, Post-menopausal    Comment: HYST-1st intercourse 82 yo-Fewer than 5 partners  Other Topics Concern   Not on file  Social History Narrative   Lives with husband   Daily caffeine:  1 cup per day (coffee or tea)   Social Determinants of Health   Financial Resource Strain: Not on file  Food Insecurity: Not on file  Transportation Needs: Not on file  Physical Activity: Not on file  Stress: Not on file  Social Connections: Not on file  Intimate Partner Violence: Not on file     ROS- All systems are reviewed and negative except as per the HPI above.  Physical Exam: Vitals:   02/22/23 1014  BP: (!) 142/70  Pulse: 64  Weight: 61.4 kg  Height: 5\' 1"  (1.549 m)    GEN- The patient is a well appearing elderly female, alert and oriented x 3 today.   HEENT-head normocephalic, atraumatic, sclera clear, conjunctiva pink, hearing intact, trachea midline. Lungs- Clear to ausculation bilaterally,  normal work of breathing Heart- Regular rate and rhythm, no murmurs, rubs or gallops  GI- soft, NT, ND, + BS Extremities- no clubbing, cyanosis, or edema MS- no significant deformity or atrophy Skin- no rash or lesion Psych- euthymic mood, full affect Neuro- strength and sensation are intact   Wt Readings from Last 3 Encounters:  02/22/23 61.4 kg  08/26/22 62.1 kg  07/27/22 61.3 kg    EKG today demonstrates  SR, RBBB Vent. rate 64 BPM PR interval 134 ms QRS duration 128 ms QT/QTcB 450/464 ms  Echo 05/28/22 demonstrated   1. Left ventricular ejection fraction, by estimation, is 55 to 60%. The  left ventricle has normal function. The left ventricle has no regional  wall motion abnormalities. Left ventricular diastolic parameters were  normal.   2. Right ventricular systolic function is normal. The right ventricular  size is normal. There is normal pulmonary artery systolic pressure.   3. Left atrial size was mildly dilated.   4. The mitral valve is normal in structure. Trivial mitral valve  regurgitation. No evidence of mitral stenosis.   5. The aortic valve is tricuspid. Aortic valve regurgitation is mild to  moderate. No aortic stenosis is present.   6. The inferior vena cava is normal in size with greater than 50%  respiratory variability, suggesting right atrial pressure of 3 mmHg.   Epic records are reviewed at length today  CHA2DS2-VASc Score = 5  The patient's score is based upon: CHF History: 0 HTN History: 1 Diabetes History: 0 Stroke History: 0 Vascular Disease History: 1 Age Score: 2 Gender Score: 1       ASSESSMENT AND PLAN: 1. Paroxysmal Atrial Fibrillation (ICD10:  I48.0) The patient's CHA2DS2-VASc score is 5, indicating a 7.2% annual risk of stroke.   Patient maintaining SR with infrequent brief episodes. She is happy with her present rhythm control.  Continue metoprolol 12.5 mg BID Continue Eliquis 5 mg BID (weight > 60 kg and Cr is < 1.5) Kardia  mobile for home monitoring.   2. Secondary Hypercoagulable State (ICD10:  D68.69) The patient is at significant risk for stroke/thromboembolism based upon her  CHA2DS2-VASc Score of 5.  Continue Apixaban (Eliquis).   3. CAD S/p MI 2015 with PCI to RCA. Myoview 05/2022 low risk No anginal symptoms.  4. HTN Stable, no changes today.   Follow up with Dr Eden Emms per recall. AF clinic in one year.    [name] PA-C [facility] [address] [place], [place] [address] [phone] 02/22/2023 10:33 AM

## 2023-03-08 ENCOUNTER — Telehealth (HOSPITAL_COMMUNITY): Payer: Self-pay | Admitting: *Deleted

## 2023-03-08 MED ORDER — METOPROLOL TARTRATE 25 MG PO TABS: 25.0000 mg | ORAL_TABLET | Freq: Four times a day (QID) | ORAL | 1 refills | Status: DC | PRN

## 2023-03-08 NOTE — Telephone Encounter (Signed)
Pt called in stating she has been in afib most of today with HRs in the 120s she took an extra 1/2 tablet of metoprolol succinate but is unsure of what else she needs to do. She had a similar episode Friday that lasted 12 hours. Discussed with [name] PA will give PRN lopressor to use for elevated rates since pt has bradycardia in normal rhythm. Pt will let us know if burden continues to increase in frequency as AAD conversation may need to be revisited at that time per pt.

## 2023-05-02 ENCOUNTER — Telehealth (HOSPITAL_COMMUNITY): Payer: Self-pay

## 2023-05-02 MED ORDER — METOPROLOL SUCCINATE ER 25 MG PO TB24: ORAL_TABLET | ORAL | 1 refills | Status: DC

## 2023-05-02 NOTE — Telephone Encounter (Signed)
Patient called in regarding her A-fib. Since she has decreased her Metoprolol dose she has been having more A-fib burden. She recently increased her Metoprolol Succinate medication to 25 mg 1/2 tablet three times daily to see if this would help with her A-fib. Clint Fenton-PA was agreeable with her taking extra 1/2 tablet daily. Sent in 90 day supply of Metoprolol Succinate 25 mg tablet to her pharmacy [facility] in [place]. She is taking in the am, noon and at bedtime. Per Clint Fenton-PA he would like for her to come in for appointment to discuss her A-fib. Scheduled patient appointment on 8/28 @ 9:00am with Clint Fenton-PA.  Consulted with patient and she verbalized understanding.

## 2023-05-14 ENCOUNTER — Other Ambulatory Visit: Payer: Self-pay | Admitting: Radiology

## 2023-05-14 DIAGNOSIS — N952 Postmenopausal atrophic vaginitis: Secondary | ICD-10-CM

## 2023-05-16 NOTE — Telephone Encounter (Signed)
Med refill request: Estradiol Last OV: 03/05/22 Next AEX: not scheduled Last MMG (if hormonal med) 06/03/20 Refill authorized: Please Advise, #42.5g, 0 RF, Pt will be asked to schedule AEX

## 2023-05-18 ENCOUNTER — Encounter (HOSPITAL_COMMUNITY): Payer: Self-pay | Admitting: Physician Assistant

## 2023-05-18 ENCOUNTER — Ambulatory Visit (HOSPITAL_COMMUNITY)
Admission: RE | Admit: 2023-05-18 | Discharge: 2023-05-18 | Disposition: A | Discharge status: Home / Self Care | Payer: PPO | Source: Ambulatory Visit | Attending: Physician Assistant | Admitting: Physician Assistant

## 2023-05-18 VITALS — BP 136/80 | HR 63 | Ht 61.0 in | Wt 133.4 lb

## 2023-05-18 DIAGNOSIS — D6869 Other thrombophilia: Secondary | ICD-10-CM

## 2023-05-18 DIAGNOSIS — I4891 Unspecified atrial fibrillation: Secondary | ICD-10-CM | POA: Diagnosis present

## 2023-05-18 DIAGNOSIS — I4510 Unspecified right bundle-branch block: Secondary | ICD-10-CM | POA: Diagnosis not present

## 2023-05-18 DIAGNOSIS — I480 Paroxysmal atrial fibrillation: Secondary | ICD-10-CM | POA: Diagnosis not present

## 2023-05-18 DIAGNOSIS — I2510 Atherosclerotic heart disease of native coronary artery without angina pectoris: Secondary | ICD-10-CM | POA: Insufficient documentation

## 2023-05-18 DIAGNOSIS — Z7902 Long term (current) use of antithrombotics/antiplatelets: Secondary | ICD-10-CM | POA: Diagnosis not present

## 2023-05-18 DIAGNOSIS — I10 Essential (primary) hypertension: Secondary | ICD-10-CM | POA: Diagnosis not present

## 2023-05-18 NOTE — Progress Notes (Signed)
Primary Care Physician: Sarah Ray., MD Primary Cardiologist: Dr Eden Emms Primary Electrophysiologist: none Referring Physician: Dr Demetrios Loll is a 82 y.o. female with a history of CAD, HTN, HLD, and new onset paroxysmal atrial fibrillation who presents for follow up in the [facility].  The patient was initially diagnosed with atrial fibrillation on 12/27/19 in cardiac rehab. She was asymptomatic but noted that her heart rate was elevated. ECG showed afib with RVR. Her metoprolol was increased and she was started on diltiazem. Patient has a CHADS2VASC score of 5. She denies snoring or alcohol use.   Patient started having symptoms of lightheadedness with an "unusual" feeling in her chest on 04/08/22. She noted her heart rate was elevated and called EMS which confirmed she was out of rhythm. She elected to not go to the ED and called [facility]. Her Eliquis was resumed and her metoprolol was increased which resolved her symptoms.   On follow up today, patient reports that for the past several weeks she has had almost daily symptoms of elevated heart rates, fatigue, and headache. She has been taking an extra 1/2 dose of metoprolol at lunch time. Thereare no specific triggers that she can identify.   Today, she denies symptoms of chest pain, orthopnea, PND, lower extremity edema, dizziness, presyncope, syncope, snoring, daytime somnolence, bleeding, or neurologic sequela. The patient is tolerating medications without difficulties and is otherwise without complaint today.    Atrial Fibrillation Risk Factors:  she does not have symptoms or diagnosis of sleep apnea. she does not have a history of rheumatic fever. she does not have a history of alcohol use. The patient does have a history of early familial atrial fibrillation or other arrhythmias. Brother has afib and PPM.   Atrial Fibrillation Management history:  Previous antiarrhythmic drugs:  none Previous cardioversions: none Previous ablations: none Anticoagulation history: Eliquis   Past Medical History:  Diagnosis Date   Adenomatous polyps 07/1997   Breast fibroadenoma    CAD (coronary artery disease) 03/18/2014   STEMI- stent to RCA and nonobstructive disease LAD and LCX   External hemorrhoids    GERD (gastroesophageal reflux disease)    LA Class Grade C Erosive esophagitis   Heart attack (HCC) 2015   Hiatal hernia    HTN (hypertension)    Hyperlipidemia    Internal hemorrhoids    Osteopenia 04/2018   T score -1.9 FRAX 21% / 5%   Peptic stricture of esophagus    Skin cancer 2014   "forehead"   ST elevation myocardial infarction (STEMI) involving right coronary artery in recovery phase (HCC) 03/18/2014    Current Outpatient Medications  Medication Sig Dispense Refill   apixaban (ELIQUIS) 5 MG TABS tablet TAKE 1 TABLET(5 MG) BY MOUTH TWICE DAILY 60 tablet 11   BIOTIN PO Take 1 tablet by mouth daily.     CALCIUM-VITAMIN D PO Take 1 tablet by mouth 2 (two) times daily.      clopidogrel (PLAVIX) 75 MG tablet TAKE 1 TABLET(75 MG) BY MOUTH AT BEDTIME 90 tablet 3   estradiol (ESTRACE) 0.1 MG/GM vaginal cream Place 1 g vaginally 3 (three) times a week. 42.5 g 0   famotidine (PEPCID) 20 MG tablet Take 20 mg by mouth daily.     metoprolol succinate (TOPROL-XL) 25 MG 24 hr tablet Take 1/2 tablet by mouth in the morning, noon and at bedtime 135 tablet 1   metoprolol tartrate (LOPRESSOR) 25 MG tablet Take 1  tablet (25 mg total) by mouth every 6 (six) hours as needed (for afib HR over 100). 45 tablet 1   nitroGLYCERIN (NITROSTAT) 0.4 MG SL tablet Place 1 tablet (0.4 mg total) under the tongue every 5 (five) minutes as needed for chest pain. 25 tablet 5   pantoprazole (PROTONIX) 40 MG tablet TAKE 1 TABLET(40 MG) BY MOUTH DAILY 90 tablet 3   rosuvastatin (CRESTOR) 5 MG tablet TAKE 1 TABLET BY MOUTH DAILY 5 DAYS A WEEK TO DAILY AS TOLERATED (Patient taking differently: TAKE 1  TABLET BY MOUTH DAILY 7 DAYS A WEEK TO DAILY AS TOLERATED) 30 tablet 11   vitamin B-12 (CYANOCOBALAMIN) 1000 MCG tablet Take 1,000 mcg by mouth daily.     No current facility-administered medications for this encounter.    ROS- All systems are reviewed and negative except as per the HPI above.  Physical Exam: Vitals:   05/18/23 0859  BP: 136/80  Pulse: 63  Weight: 60.5 kg  Height: 5\' 1"  (1.549 m)    GEN: Well nourished, well developed in no acute distress NECK: No JVD; No carotid bruits CARDIAC: Regular rate and rhythm with occasional ectopy, no murmurs, rubs, gallops RESPIRATORY:  Clear to auscultation without rales, wheezing or rhonchi  ABDOMEN: Soft, non-tender, non-distended EXTREMITIES:  No edema; No deformity    Wt Readings from Last 3 Encounters:  05/18/23 60.5 kg  02/22/23 61.4 kg  08/26/22 62.1 kg    EKG today demonstrates  SR, RBBB, PACs Vent. rate 63 BPM PR interval 152 ms QRS duration 120 ms QT/QTcB 438/448 ms  Echo 05/28/22 demonstrated   1. Left ventricular ejection fraction, by estimation, is 55 to 60%. The  left ventricle has normal function. The left ventricle has no regional  wall motion abnormalities. Left ventricular diastolic parameters were  normal.   2. Right ventricular systolic function is normal. The right ventricular  size is normal. There is normal pulmonary artery systolic pressure.   3. Left atrial size was mildly dilated.   4. The mitral valve is normal in structure. Trivial mitral valve  regurgitation. No evidence of mitral stenosis.   5. The aortic valve is tricuspid. Aortic valve regurgitation is mild to  moderate. No aortic stenosis is present.   6. The inferior vena cava is normal in size with greater than 50%  respiratory variability, suggesting right atrial pressure of 3 mmHg.   Epic records are reviewed at length today  CHA2DS2-VASc Score = 5  The patient's score is based upon: CHF History: 0 HTN History: 1 Diabetes  History: 0 Stroke History: 0 Vascular Disease History: 1 Age Score: 2 Gender Score: 1        ASSESSMENT AND PLAN: Paroxysmal Atrial Fibrillation (ICD10:  I48.0) The patient's CHA2DS2-VASc score is 5, indicating a 7.2% annual risk of stroke.   Patient having more frequent symptoms of afib. We discussed rhythm control options including AAD (Multaq, dofetilide, amiodarone) and ablation. Patient expresses concern about her sensitivities to several medications. She would prefer a consultation with EP to discuss ablation.  Continue metoprolol 12.5 mg BID, may take TID if in afib. Continue Eliquis 5 mg BID (weight > 60 kg and Cr is < 1.5). Will need to watch weight closely for dose reduction.  Kardia mobile for home monitoring.   Secondary Hypercoagulable State (ICD10:  D68.69) The patient is at significant risk for stroke/thromboembolism based upon her CHA2DS2-VASc Score of 5.  Continue Apixaban (Eliquis).   CAD S/p MI 2015 with PCI to RCA.  Myoview 05/2022 low risk No anginal symptoms  HTN Stable on current regimen   Follow up with EP to discuss possible ablation. She lives in [place].    [name] PA-C [facility] [address] [place], [place] [address] [phone] 05/18/2023 9:08 AM

## 2023-05-22 ENCOUNTER — Other Ambulatory Visit: Payer: Self-pay | Admitting: Cardiovascular Disease

## 2023-06-07 ENCOUNTER — Ambulatory Visit (INDEPENDENT_AMBULATORY_CARE_PROVIDER_SITE_OTHER): Payer: PPO | Admitting: Radiology

## 2023-06-07 VITALS — BP 124/80

## 2023-06-07 DIAGNOSIS — N952 Postmenopausal atrophic vaginitis: Secondary | ICD-10-CM

## 2023-06-07 DIAGNOSIS — N8110 Cystocele, unspecified: Secondary | ICD-10-CM | POA: Diagnosis not present

## 2023-06-07 DIAGNOSIS — Z4689 Encounter for fitting and adjustment of other specified devices: Secondary | ICD-10-CM

## 2023-06-07 DIAGNOSIS — N898 Other specified noninflammatory disorders of vagina: Secondary | ICD-10-CM

## 2023-06-07 LAB — WET PREP FOR TRICH, YEAST, CLUE

## 2023-06-07 MED ORDER — FLUCONAZOLE 150 MG PO TABS: 150.0000 mg | ORAL_TABLET | ORAL | 0 refills | Status: DC

## 2023-06-07 NOTE — Progress Notes (Signed)
   Subjective:     Chief Complaint:  Chief Complaint  Patient presents with   Pessary Check    Complains of vaginal discharge, itching, odor, trouble removing pessary (tears and bleeds with removing).   History of Present Illness: Sarah Ray is a 82 y.o. female with stage III pelvic organ prolapse who presents for a pessary check. She is using a size 3 ring with support pessary. The pessary has been working well and she has no complaints. She is using vaginal estrogen. She denies vaginal bleeding.  Past Medical History: Patient  has a past medical history of Adenomatous polyps (07/1997), Breast fibroadenoma, CAD (coronary artery disease) (03/18/2014), External hemorrhoids, GERD (gastroesophageal reflux disease), Heart attack (HCC) (2015), Hiatal hernia, HTN (hypertension), Hyperlipidemia, Internal hemorrhoids, Osteopenia (04/2018), Peptic stricture of esophagus, Skin cancer (2014), and ST elevation myocardial infarction (STEMI) involving right coronary artery in recovery phase (HCC) (03/18/2014).   Past Surgical History: She  has a past surgical history that includes Thyroid cyst excision (1970's); Bunionectomy (Bilateral, 04/04/2013); Coronary angioplasty with stent (03/18/14); Tonsillectomy and adenoidectomy (~ 1950); Abdominal hysterectomy (1977); Breast cyst aspiration (Right, ~ 2011); Esophagogastroduodenoscopy (egd) with esophageal dilation (2000's X 1); Skin cancer excision (2014); left heart cath (N/A, 03/18/2014); left heart catheterization with coronary angiogram (N/A, 04/22/2014); and Cardiac catheterization (N/A, 09/29/2016).   Medications: She has a current medication list which includes the following prescription(s): eliquis, biotin, calcium-vitamin d, clopidogrel, estradiol, famotidine, metoprolol succinate, nitroglycerin, pantoprazole, rosuvastatin, cyanocobalamin, and metoprolol tartrate.   Allergies: Patient is allergic to naprosyn [naproxen], ticagrelor, aspirin, atorvastatin,  other, bempedoic acid, covid-19 (mrna) vaccine, gabapentin, and praluent [alirocumab].       Objective:    Physical Exam: BP 124/80 (BP Location: Left Arm, Patient Position: Sitting, Cuff Size: Normal)  Gen: No apparent distress, A&O x 3. Detailed Urogynecologic Evaluation:  Pelvic Exam: Normal external female genitalia; Bartholin's and Skene's glands normal in appearance; urethral meatus normal in appearance, no urethral masses, + scant yellow discharge. The pessary was noted to be in place. It was removed and cleaned. Speculum exam revealed no lesions in the vagina. The pessary was replaced. It was comfortable to the patient and fit well.   Laboratory Results: Microscopic wet-mount exam shows hyphae.     Assessment/Plan:    1. Pelvic organ prolapse quantification stage 3 cystocele  2. Pessary maintenance  3. Vaginal odor - WET PREP FOR TRICH, YEAST, CLUE Rx sent for fluconazole  4. Atrophy of vagina  She will keep the pessary in place until next visit. She will continue to use estrogen. She will follow-up in 3 months for a pessary check or sooner as needed.  All questions were answered.  Arlie Solomons, [facility]

## 2023-07-07 ENCOUNTER — Other Ambulatory Visit: Payer: Self-pay

## 2023-07-07 MED ORDER — ROSUVASTATIN CALCIUM 5 MG PO TABS: ORAL_TABLET | ORAL | 0 refills | Status: DC

## 2023-07-14 NOTE — Progress Notes (Signed)
Cardiology Office Note    Patient Name: Sarah Ray Date of Encounter: 07/14/2023  Primary Care Provider:  Gordan Payment., MD Primary Cardiologist:  Charlton Haws, MD Primary Electrophysiologist: None   Past Medical History    Past Medical History:  Diagnosis Date   Adenomatous polyps 07/1997   Breast fibroadenoma    CAD (coronary artery disease) 03/18/2014   STEMI- stent to RCA and nonobstructive disease LAD and LCX   External hemorrhoids    GERD (gastroesophageal reflux disease)    LA Class Grade C Erosive esophagitis   Heart attack (HCC) 2015   Hiatal hernia    HTN (hypertension)    Hyperlipidemia    Internal hemorrhoids    Osteopenia 04/2018   T score -1.9 FRAX 21% / 5%   Peptic stricture of esophagus    Skin cancer 2014   "forehead"   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 female with PMH of CAD s/p STEMI 02/2014 treated with DES x1 to RCA, repeat cath treated with stents in 04/2014, HTN, HLD who presents today for 1 year follow-up.  Sarah Ray was last seen on 06/22/2022 for follow-up of chest pain.  During visit patient reported complaint of pressure and burning in her chest.  She underwent an exercise Myoview that showed no ischemia or infarction.  She also completed a 2D echo that showed EF of 55-60% with no LVH and normal LV filling pressures, trivial MVR with no significant valve disease.  She was noted to have elevated BP and was advised to continue to monitor.  She has continued to be followed in the AF clinic and was last seen by [name], PA on 05/18/2023 and reported daily symptoms of elevated heart rate with fatigue and headache.  She elected to have an EP consultation to discuss ablation due to several sensitivities to medications.  She is scheduled to see Dr. Elberta Fortis on 07/17/2022 to discuss further.  Sarah Ray presents today with  her husband for 84-month follow-up.  During today's visit she reports no acute cardiac changes since her previous follow-up.  She is rate controlled today with a heart rate of 65 bpm and is tolerating her medications without any adverse reactions.  She is still staying active and goes to cardiac rehab 5 days a week as well as gardens in her spare time.  She denies any chest pain but does note some back pain in her cervical region that has occurred in the past.  Patient denies chest pain, palpitations, dyspnea, PND, orthopnea, nausea, vomiting, dizziness, syncope, edema, weight gain, or early satiety.   Review of Systems  Please see the history of present illness.    All other systems reviewed and are otherwise negative except as noted above.  Physical Exam    Wt Readings from Last 3 Encounters:  05/18/23 133 lb 6.4 oz (60.5 kg)  02/22/23 135 lb 6.4 oz (61.4 kg)  08/26/22 136 lb 12.8 oz (62.1 kg)   WU:JWJXB were no vitals filed for this visit.,There is no height or weight on file to calculate BMI. GEN: Well nourished, well developed in no acute distress Neck: No JVD; No carotid bruits Pulmonary: Clear to auscultation without rales, wheezing or rhonchi  Cardiovascular: Normal rate. Regular rhythm. Normal S1. Normal S2.   Murmurs: There is no murmur.  ABDOMEN: Soft, non-tender, non-distended EXTREMITIES:  No edema; No  deformity   EKG/LABS/ Recent Cardiac Studies   ECG personally reviewed by me today -none completed today  Risk Assessment/Calculations:    CHA2DS2-VASc Score = 5   This indicates a 7.2% annual risk of stroke. The patient's score is based upon: CHF History: 0 HTN History: 1 Diabetes History: 0 Stroke History: 0 Vascular Disease History: 1 Age Score: 2 Gender Score: 1         Lab Results  Component Value Date   WBC 3.8 (L) 12/31/2019   HGB 13.4 12/31/2019   HCT 41.4 12/31/2019   MCV 102.7 (H) 12/31/2019   PLT 200 12/31/2019   Lab Results  Component Value  Date   CREATININE 0.98 04/09/2022   BUN 13 04/09/2022   NA 137 04/09/2022   K 4.2 04/09/2022   CL 108 04/09/2022   CO2 23 04/09/2022   Lab Results  Component Value Date   CHOL 158 01/16/2020   HDL 55 01/16/2020   LDLCALC 84 01/16/2020   LDLDIRECT 112 (H) 06/15/2018   TRIG 105 01/16/2020   CHOLHDL 2.9 01/16/2020    Lab Results  Component Value Date   HGBA1C 5.8 (H) 03/18/2014   Assessment & Plan    1. Coronary artery disease: -s/p STEMI 02/2014 treated with DES x1 to RCA, repeat cath treated with stents in 04/2014 -Lexiscan exercise Myoview and 2D echo completed with no abnormal findings -Today patient reports no angina or chest pain. -Continue current GDMT with Toprol-XL 37.5 mg, Plavix 75 mg, Crestor 5 mg  2.Paroxysmal atrial fibrillation: -Patient currently rate controlled on Toprol-XL 37.5 mg daily with as needed metoprolol 25 mg -She is scheduled to meet with Dr. Elberta Fortis to discuss AF ablation due to ongoing bouts of atrial fibrillation. -Patient's hemoglobin today was 13.4 and 0.7 on -Continue Eliquis 5 mg twice daily -CHA2DS2-VASc Score = 5 [CHF History: 0, HTN History: 1, Diabetes History: 0, Stroke History: 0, Vascular Disease History: 1, Age Score: 2, Gender Score: 1].  Therefore, the patient's annual risk of stroke is 7.2 %.      3.  Essential hypertension: -Patient's blood pressure today was 136/72 -Continue Toprol-XL 37.5 mg daily  4.Hyperlipidemia: -Patient has a history of statin intolerance and is currently followed by lipid clinic.  -Continue Crestor 5 mg 5 days/week -Current LDL cholesterol is 40 well-controlled and less than goal of 70  Disposition: Follow-up with Charlton Haws, MD or APP in 6 months    Signed, Napoleon Form, Leodis Rains, NP 07/14/2023, 5:52 PM [facility]

## 2023-07-15 ENCOUNTER — Ambulatory Visit: Payer: PPO | Attending: Nurse Practitioner | Admitting: Nurse Practitioner

## 2023-07-15 ENCOUNTER — Encounter: Payer: Self-pay | Admitting: Nurse Practitioner

## 2023-07-15 VITALS — BP 136/72 | HR 65 | Ht 61.0 in | Wt 137.2 lb

## 2023-07-15 DIAGNOSIS — I480 Paroxysmal atrial fibrillation: Secondary | ICD-10-CM

## 2023-07-15 DIAGNOSIS — E785 Hyperlipidemia, unspecified: Secondary | ICD-10-CM | POA: Diagnosis not present

## 2023-07-15 DIAGNOSIS — I10 Essential (primary) hypertension: Secondary | ICD-10-CM | POA: Diagnosis not present

## 2023-07-15 DIAGNOSIS — I2510 Atherosclerotic heart disease of native coronary artery without angina pectoris: Secondary | ICD-10-CM

## 2023-07-15 MED ORDER — ROSUVASTATIN CALCIUM 5 MG PO TABS
ORAL_TABLET | ORAL | 2 refills | Status: DC
  Filled 2024-01-09: qty 90, 90d supply, fill #0

## 2023-07-15 NOTE — Patient Instructions (Signed)
Medication Instructions:  Your physician recommends that you continue on your current medications as directed. Please refer to the Current Medication list given to you today.  *If you need a refill on your cardiac medications before your next appointment, please call your pharmacy*   Lab Work: None ordered   Testing/Procedures: None ordered   Follow-Up: At [facility], you and your health needs are our priority.  As part of our continuing mission to provide you with exceptional heart care, we have created designated Provider Care Teams.  These Care Teams include your primary Cardiologist (physician) and Advanced Practice Providers (APPs -  Physician Assistants and Nurse Practitioners) who all work together to provide you with the care you need, when you need it.  We recommend signing up for the patient portal called "MyChart".  Sign up information is provided on this After Visit Summary.  MyChart is used to connect with patients for Virtual Visits (Telemedicine).  Patients are able to view lab/test results, encounter notes, upcoming appointments, etc.  Non-urgent messages can be sent to your provider as well.   To learn more about what you can do with MyChart, go to [email or web address].    Your next appointment:   6 month(s)  Provider:   Charlton Haws, MD    Other Instructions

## 2023-07-18 ENCOUNTER — Ambulatory Visit: Payer: PPO | Attending: Cardiology | Admitting: Cardiology

## 2023-07-18 ENCOUNTER — Encounter: Payer: Self-pay | Admitting: Cardiology

## 2023-07-18 VITALS — BP 138/84 | HR 65 | Ht 61.0 in | Wt 136.0 lb

## 2023-07-18 DIAGNOSIS — D6869 Other thrombophilia: Secondary | ICD-10-CM

## 2023-07-18 DIAGNOSIS — I480 Paroxysmal atrial fibrillation: Secondary | ICD-10-CM | POA: Diagnosis not present

## 2023-07-18 DIAGNOSIS — I2510 Atherosclerotic heart disease of native coronary artery without angina pectoris: Secondary | ICD-10-CM

## 2023-07-18 DIAGNOSIS — I10 Essential (primary) hypertension: Secondary | ICD-10-CM

## 2023-07-18 NOTE — Patient Instructions (Signed)
Medication Instructions:  Your physician recommends that you continue on your current medications as directed. Please refer to the Current Medication list given to you today.  *If you need a refill on your cardiac medications before your next appointment, please call your pharmacy*   Lab Work: None ordered   Testing/Procedures: None ordered   Follow-Up: At [facility], you and your health needs are our priority.  As part of our continuing mission to provide you with exceptional heart care, we have created designated Provider Care Teams.  These Care Teams include your primary Cardiologist (physician) and Advanced Practice Providers (APPs -  Physician Assistants and Nurse Practitioners) who all work together to provide you with the care you need, when you need it.  Your next appointment:   To be  determined --- please call the office (or send a mychart message) and let us know if you would like to proceed with ablation.   We will determine follow up based on your decision.  The format for your next appointment:   In Person  Provider:   Loman Brooklyn, MD{   Thank you for choosing [facility]!!   Dory Horn, RN [phone]  Other Instructions   Cardiac Ablation Cardiac ablation is a procedure to destroy (ablate) heart tissue that is sending bad signals. These bad signals cause the heart to beat very fast or in a way that is not normal. Destroying some tissues can help make the heart rhythm normal. Tell your doctor about: Any allergies you have. All medicines you are taking. These include vitamins, herbs, eye drops, creams, and over-the-counter medicines. Any problems you or family members have had with anesthesia. Any bleeding problems you have. Any surgeries you have had. Any medical conditions you have. Whether you are pregnant or may be pregnant. What are the risks? Your doctor will talk with you about risks. These may include: Infection. Bruising and  bleeding. Stroke or blood clots. Damage to nearby areas of your body. Allergies to medicines or dyes. Needing a pacemaker if the heart gets damaged. A pacemaker helps the heart beat normally. The procedure not working. What happens before the procedure? Medicines Ask your doctor about changing or stopping: Your normal medicines. Vitamins, herbs, and supplements. Over-the-counter medicines. Do not take aspirin or ibuprofen unless you are told to. General instructions Follow instructions from your doctor about what you may eat and drink. If you will be going home right after the procedure, plan to have a responsible adult: Take you home from the hospital or clinic. You will not be allowed to drive. Care for you for the time you are told. Ask your doctor what steps will be taken to prevent the spread of germs. What happens during the procedure?  An IV tube will be put into one of your veins. You may be given: A sedative. This helps you relax. Anesthesia. This will: Numb certain areas of your body. The skin on your neck or groin will be numbed. A cut (incision) will be made in your neck or groin. A needle will be put through the cut and into a large vein. The small, thin tube (catheter) will be put into the needle. The tube will be moved to your heart. A type of X-ray (fluoroscopy) will be used to help guide the tube. It will also show constant images of the heart on a screen. Dye may be put through the tube. This helps your doctor see your heart. An electric current will be sent from  the tube to destroy heart tissue in certain areas. The tube will be taken out. Pressure will be held on your cut. This helps stop bleeding. A bandage (dressing) will be put over your cut. The procedure may vary among doctors and hospitals. What happens after the procedure? You will be monitored until you leave the hospital or clinic. This includes checking your blood pressure, heart rate and rhythm,  breathing rate, and blood oxygen level. Your cut will be checked for bleeding. You will need to lie still for a few hours. If your groin was used, you will need to keep your leg straight for a few hours after the small, thin tube is removed. This information is not intended to replace advice given to you by your health care provider. Make sure you discuss any questions you have with your health care provider. Document Revised: 02/23/2022 Document Reviewed: 02/23/2022 Elsevier Patient Education  2024 [employer].

## 2023-07-18 NOTE — Progress Notes (Signed)
  Electrophysiology Office Note:   Date:  07/18/2023  ID:  Sarah Ray, DOB 06-20-1941, MRN [id number]  Primary Cardiologist: Charlton Haws, MD Electrophysiologist: None      History of Present Illness:   Sarah Ray is a 82 y.o. female with h/o coronary artery disease, hypertension, hyperlipidemia, atrial fibrillation seen today for  for Electrophysiology evaluation of atrial fibrillation at the request of Sarah Ray.    Patient was diagnosed with atrial fibrillation 12/27/2019 at cardiac rehab.  She was asymptomatic but noted that her heart rate was elevated.  EKG showed rapid atrial fibrillation.  She has been having symptoms of lightheadedness and unusual feeling in her chest.  This was July 2023.  She called EMS and was noted to be in atrial fibrillation.  Over the past few weeks, she has had daily issues with atrial fibrillation.  She has elevated heart rates, fatigue, headaches.  When she is in normal rhythm, she feels well without complaint.  Review of systems complete and found to be negative unless listed in HPI.   EP Information / Studies Reviewed:    EKG is ordered today. Personal review as below.  EKG Interpretation Date/Time:  Monday July 18 2023 11:03:12 EDT Ventricular Rate:  65 PR Interval:  156 QRS Duration:  128 QT Interval:  440 QTC Calculation: 457 R Axis:   -9  Text Interpretation: Normal sinus rhythm Right bundle branch block Abnormal ECG When compared with ECG of 18-May-2023 09:01, Premature atrial complexes are no longer Present Confirmed by [name] ([id number]) on 07/18/2023 11:07:53 AM     Risk Assessment/Calculations:    CHA2DS2-VASc Score = 5   This indicates a 7.2% annual risk of stroke. The patient's score is based upon: CHF History: 0 HTN History: 1 Diabetes History: 0 Stroke History: 0 Vascular Disease History: 1 Age Score: 2 Gender Score: 1             Physical Exam:   VS:  BP 138/84   Pulse 65   Ht 5\' 1"  (1.549 m)   Wt 136 lb  (61.7 kg)   SpO2 98%   BMI 25.70 kg/m    Wt Readings from Last 3 Encounters:  07/18/23 136 lb (61.7 kg)  07/15/23 137 lb 3.2 oz (62.2 kg)  05/18/23 133 lb 6.4 oz (60.5 kg)     GEN: Well nourished, well developed in no acute distress NECK: No JVD; No carotid bruits CARDIAC: Regular rate and rhythm, no murmurs, rubs, gallops RESPIRATORY:  Clear to auscultation without rales, wheezing or rhonchi  ABDOMEN: Soft, non-tender, non-distended EXTREMITIES:  No edema; No deformity   ASSESSMENT AND PLAN:    1.  Paroxysmal atrial fibrillation: Currently on metoprolol.  She would likely benefit from rhythm control.  We discussed medication options versus ablation.  Risk and benefits of ablation were discussed.  She understands the risks.  She would like to think about this further with her family.  She [name] let us know which she would like to do.  2.  Secondary hypercoagulable state: Currently on Eliquis for atrial fibrillation  3.  Coronary artery disease: No current angina.  Plan per primary cardiology  4.  Hypertension: Currently well-controlled  Follow up with Dr. Elberta Fortis  pending decision on A-fib ablation   Signed, [name] Sarah Loa, MD

## 2023-08-05 ENCOUNTER — Telehealth: Payer: Self-pay | Admitting: Cardiology

## 2023-08-05 DIAGNOSIS — Z79899 Other long term (current) drug therapy: Secondary | ICD-10-CM

## 2023-08-05 DIAGNOSIS — I480 Paroxysmal atrial fibrillation: Secondary | ICD-10-CM

## 2023-08-05 NOTE — Telephone Encounter (Signed)
Pt aware office will call to arrange.  Aware it may be several weeks before hearing from [place] to schedule.  Aware it will be March/April for procedure. Pt agreeable to plan.

## 2023-08-05 NOTE — Telephone Encounter (Signed)
New Message:     Patient says she is now ready to schedule the Ablation.

## 2023-08-13 ENCOUNTER — Other Ambulatory Visit: Payer: Self-pay | Admitting: Gastroenterology

## 2023-08-14 ENCOUNTER — Other Ambulatory Visit: Payer: Self-pay | Admitting: Radiology

## 2023-08-14 DIAGNOSIS — N952 Postmenopausal atrophic vaginitis: Secondary | ICD-10-CM

## 2023-08-16 NOTE — Telephone Encounter (Signed)
Med refill request: estradiol 0.1 mg vaginal cream Last OV: 06/07/23 Next AEX: none Last MMG (if hormonal med) 06/03/20 BI-RADS 2 benign Refill sent to provider for approval or denial as appropriate.

## 2023-08-20 ENCOUNTER — Other Ambulatory Visit: Payer: Self-pay | Admitting: Cardiovascular Disease

## 2023-09-12 ENCOUNTER — Encounter: Payer: Self-pay | Admitting: *Deleted

## 2023-09-12 NOTE — Addendum Note (Signed)
Addended by: Baird Lyons on: 09/12/2023 11:33 AM   Modules accepted: Orders

## 2023-09-12 NOTE — Telephone Encounter (Signed)
Pt scheduled for afib ablation 10/04/23. Reviewed CT and ablation instructions, pt aware will be sent via mychart for her review. CT scheduled for 09/22/23. She will get blood work by 10/21/2023 Patient verbalized understanding and agreeable to plan.

## 2023-09-17 LAB — BASIC METABOLIC PANEL
BUN/Creatinine Ratio: 13 (ref 12–28)
BUN: 11 mg/dL (ref 8–27)
CO2: 26 mmol/L (ref 20–29)
Calcium: 10.2 mg/dL (ref 8.7–10.3)
Chloride: 103 mmol/L (ref 96–106)
Creatinine, Ser: 0.82 mg/dL (ref 0.57–1.00)
Glucose: 98 mg/dL (ref 70–99)
Potassium: 3.9 mmol/L (ref 3.5–5.2)
Sodium: 142 mmol/L (ref 134–144)
eGFR: 70 mL/min/{1.73_m2} (ref >59)

## 2023-09-17 LAB — CBC
Hematocrit: 39.1 % (ref 34.0–46.6)
Hemoglobin: 13.2 g/dL (ref 11.1–15.9)
MCH: 34.2 pg — ABNORMAL HIGH (ref 26.6–33.0)
MCHC: 33.8 g/dL (ref 31.5–35.7)
MCV: 101 fL — ABNORMAL HIGH (ref 79–97)
Platelets: 215 10*3/uL (ref 150–450)
RBC: 3.86 x10E6/uL (ref 3.77–5.28)
RDW: 12.8 % (ref 11.7–15.4)
WBC: 5.4 10*3/uL (ref 3.4–10.8)

## 2023-09-19 ENCOUNTER — Telehealth: Payer: Self-pay | Admitting: Cardiology

## 2023-09-19 NOTE — Telephone Encounter (Signed)
Pt would like a c/b regarding upcoming CT. Please advise

## 2023-09-22 ENCOUNTER — Ambulatory Visit (HOSPITAL_BASED_OUTPATIENT_CLINIC_OR_DEPARTMENT_OTHER)
Admission: RE | Admit: 2023-09-22 | Discharge: 2023-09-22 | Disposition: A | Discharge status: Home / Self Care | Payer: PPO | Source: Ambulatory Visit | Attending: Cardiology | Admitting: Cardiology

## 2023-09-22 DIAGNOSIS — I480 Paroxysmal atrial fibrillation: Secondary | ICD-10-CM | POA: Insufficient documentation

## 2023-09-22 MED ORDER — IOHEXOL 350 MG/ML SOLN
100.0000 mL | Freq: Once | INTRAVENOUS | Status: AC | PRN
  Administered 2023-09-22: 80 mL via INTRAVENOUS

## 2023-09-29 ENCOUNTER — Ambulatory Visit: Payer: PPO | Admitting: Radiology

## 2023-09-29 VITALS — BP 136/84 | HR 54

## 2023-09-29 DIAGNOSIS — N8110 Cystocele, unspecified: Secondary | ICD-10-CM | POA: Diagnosis not present

## 2023-09-29 NOTE — Progress Notes (Signed)
   Subjective:     Chief Complaint:  Chief Complaint  Patient presents with   Pessary Check    Patient removed her pessary and brought it with her.  Patient is scheduled for a cardiac ablation next Tuesday.  Patient was unsure if she could keep pessary in place with procedure.  Patient feels she is doing well since she removed pessary (not leaking urine).    History of Present Illness: Sarah Ray is a 83 y.o. female with stage III pelvic organ prolapse who presents for a pessary check. She is using a size 3 ring with support pessary. The pessary has been working well for prolapse but she noticed more leaking so she removed it. She is using vaginal estrogen. She denies vaginal bleeding.  Past Medical History: Patient  has a past medical history of Adenomatous polyps (07/1997), Breast fibroadenoma, CAD (coronary artery disease) (03/18/2014), External hemorrhoids, GERD (gastroesophageal reflux disease), Heart attack (HCC) (2015), Hiatal hernia, HTN (hypertension), Hyperlipidemia, Internal hemorrhoids, Osteopenia (04/2018), Peptic stricture of esophagus, Skin cancer (2014), and ST elevation myocardial infarction (STEMI) involving right coronary artery in recovery phase (HCC) (03/18/2014).   Past Surgical History: She  has a past surgical history that includes Thyroid  cyst excision (1970's); Bunionectomy (Bilateral, 04/04/2013); Coronary angioplasty with stent (03/18/14); Tonsillectomy and adenoidectomy (~ 1950); Abdominal hysterectomy (1977); Breast cyst aspiration (Right, ~ 2011); Esophagogastroduodenoscopy (egd) with esophageal dilation (2000's X 1); Skin cancer excision (2014); left heart cath (N/A, 03/18/2014); left heart catheterization with coronary angiogram (N/A, 04/22/2014); and Cardiac catheterization (N/A, 09/29/2016).   Medications: She has a current medication list which includes the following prescription(s): eliquis , biotin, calcium  carbonate-vitamin d, clopidogrel , estradiol , famotidine ,  menthol-zinc oxide, metoprolol  succinate, metoprolol  tartrate, pantoprazole , pramoxine-menthol-dimethicone, rosuvastatin , b complex vitamins, and nitroglycerin .   Allergies: Patient is allergic to naprosyn [naproxen], ticagrelor , aspirin , atorvastatin , other, bempedoic acid , covid-19 (mrna) vaccine, gabapentin , milk-related compounds, and praluent  [alirocumab ].       Objective:    Physical Exam: BP 136/84 (BP Location: Left Arm, Patient Position: Sitting, Cuff Size: Normal)   Pulse (!) 54   SpO2 97%  Gen: No apparent distress, A&O x 3. Detailed Urogynecologic Evaluation:  Pelvic Exam: Normal external female genitalia; Bartholin's and Skene's glands normal in appearance; urethral meatus normal in appearance, no urethral masses, no discharge.  Speculum exam revealed no lesions in the vagina. The pessary was not replaced as patient would like to leave it out for now until she has her cardiac procedure.    Assessment/Plan:    1. Pelvic organ prolapse quantification stage 3 cystocele Will leave pessary out for now and call when she is ready to have one refitted- will plan for an incontinence dish to help with leakage. Pt agreeable to plan.    Tilford Deaton, WHNP

## 2023-09-30 ENCOUNTER — Telehealth: Payer: Self-pay | Admitting: Cardiology

## 2023-09-30 NOTE — Telephone Encounter (Signed)
 Pt states someone called her back.. not sure who. She states she wants to make sure she has her medications right prior to ablation.

## 2023-09-30 NOTE — Telephone Encounter (Signed)
 Spoke with patient and reviewed ablation instructions. All questions answered.

## 2023-09-30 NOTE — Telephone Encounter (Signed)
 New Message:     Please call, she have some questions about the Ablation.

## 2023-10-03 ENCOUNTER — Encounter (HOSPITAL_COMMUNITY): Payer: Self-pay

## 2023-10-03 NOTE — Telephone Encounter (Signed)
 Patient states her husband has been sick and they are headed to the hospital. She would like to reschedule ablation.

## 2023-10-03 NOTE — Telephone Encounter (Signed)
 Returned call to pt - LM Called her Daughter and she stated that the pt's Husband is currently in the hospital and she will need to reschedule. She will have her Mom call us back to reschedule when she can.

## 2023-10-04 ENCOUNTER — Encounter (HOSPITAL_COMMUNITY): Admission: RE | Payer: Self-pay | Source: Home / Self Care

## 2023-10-04 ENCOUNTER — Ambulatory Visit (HOSPITAL_COMMUNITY): Admission: RE | Admit: 2023-10-04 | Payer: PPO | Source: Home / Self Care | Admitting: Cardiology

## 2023-10-04 SURGERY — ATRIAL FIBRILLATION ABLATION
Anesthesia: General

## 2023-10-24 ENCOUNTER — Other Ambulatory Visit: Payer: Self-pay | Admitting: *Deleted

## 2023-10-24 DIAGNOSIS — I480 Paroxysmal atrial fibrillation: Secondary | ICD-10-CM

## 2023-10-24 MED ORDER — APIXABAN 5 MG PO TABS
5.0000 mg | ORAL_TABLET | Freq: Two times a day (BID) | ORAL | 1 refills | Status: DC
  Filled 2024-01-16: qty 180, 90d supply, fill #0

## 2023-10-24 NOTE — Telephone Encounter (Signed)
Eliquis 5mg  refill request received. Patient is 83 years old, weight-61.7kg, Crea-0.82 on 09/16/23, Diagnosis-Afib, and last seen by Dr. Elberta Fortis on 10/24824. Dose is appropriate based on dosing criteria. Will send in refill to requested pharmacy.

## 2023-10-26 ENCOUNTER — Other Ambulatory Visit: Payer: Self-pay

## 2023-10-26 MED ORDER — PANTOPRAZOLE SODIUM 40 MG PO TBEC: DELAYED_RELEASE_TABLET | ORAL | 0 refills | Status: DC

## 2023-10-26 MED ORDER — METOPROLOL SUCCINATE ER 25 MG PO TB24
ORAL_TABLET | ORAL | 2 refills | Status: DC
  Filled 2023-12-05 – 2024-01-10 (×3): qty 135, 90d supply, fill #0

## 2023-10-31 ENCOUNTER — Other Ambulatory Visit (HOSPITAL_COMMUNITY): Payer: Self-pay

## 2023-11-01 ENCOUNTER — Other Ambulatory Visit (HOSPITAL_COMMUNITY): Payer: Self-pay

## 2023-11-01 ENCOUNTER — Ambulatory Visit (HOSPITAL_COMMUNITY)
Admission: RE | Admit: 2023-11-01 | Discharge: 2023-11-01 | Disposition: A | Discharge status: Home / Self Care | Payer: PPO | Source: Ambulatory Visit | Attending: Physician Assistant | Admitting: Physician Assistant

## 2023-11-01 ENCOUNTER — Encounter (HOSPITAL_COMMUNITY): Payer: Self-pay | Admitting: Physician Assistant

## 2023-11-01 VITALS — BP 132/90 | HR 110 | Ht 61.0 in | Wt 137.2 lb

## 2023-11-01 DIAGNOSIS — E785 Hyperlipidemia, unspecified: Secondary | ICD-10-CM | POA: Diagnosis not present

## 2023-11-01 DIAGNOSIS — I252 Old myocardial infarction: Secondary | ICD-10-CM | POA: Diagnosis not present

## 2023-11-01 DIAGNOSIS — I480 Paroxysmal atrial fibrillation: Secondary | ICD-10-CM | POA: Diagnosis not present

## 2023-11-01 DIAGNOSIS — I10 Essential (primary) hypertension: Secondary | ICD-10-CM | POA: Insufficient documentation

## 2023-11-01 DIAGNOSIS — I2510 Atherosclerotic heart disease of native coronary artery without angina pectoris: Secondary | ICD-10-CM | POA: Diagnosis not present

## 2023-11-01 DIAGNOSIS — D6869 Other thrombophilia: Secondary | ICD-10-CM | POA: Insufficient documentation

## 2023-11-01 DIAGNOSIS — Z79899 Other long term (current) drug therapy: Secondary | ICD-10-CM | POA: Insufficient documentation

## 2023-11-01 DIAGNOSIS — Z7901 Long term (current) use of anticoagulants: Secondary | ICD-10-CM | POA: Diagnosis not present

## 2023-11-01 DIAGNOSIS — I483 Typical atrial flutter: Secondary | ICD-10-CM | POA: Diagnosis not present

## 2023-11-01 MED ORDER — FAMOTIDINE 20 MG PO TABS
20.0000 mg | ORAL_TABLET | Freq: Two times a day (BID) | ORAL | 3 refills | Status: DC
  Filled 2024-01-10 (×3): qty 180, 90d supply, fill #0
  Filled 2024-04-05: qty 180, 90d supply, fill #1

## 2023-11-01 MED ORDER — APIXABAN 5 MG PO TABS: 5.0000 mg | ORAL_TABLET | Freq: Two times a day (BID) | ORAL | 11 refills | Status: DC

## 2023-11-01 MED ORDER — ROSUVASTATIN CALCIUM 5 MG PO TABS
5.0000 mg | ORAL_TABLET | Freq: Every day | ORAL | 0 refills | Status: DC
Start: 2023-07-07 — End: 2023-11-15

## 2023-11-01 MED ORDER — METOPROLOL TARTRATE 25 MG PO TABS
25.0000 mg | ORAL_TABLET | Freq: Four times a day (QID) | ORAL | 1 refills | Status: DC | PRN
  Filled 2024-01-10: qty 45, 12d supply, fill #0

## 2023-11-01 NOTE — Progress Notes (Signed)
Primary Care Physician: Gordan Payment., MD Primary Cardiologist: Dr Eden Emms Primary Electrophysiologist: Dr Elberta Fortis  Referring Physician: Dr Demetrios Loll is a 83 y.o. female with a history of CAD, HTN, HLD, and new onset paroxysmal atrial fibrillation who presents for follow up in the [facility].  The patient was initially diagnosed with atrial fibrillation on 12/27/19 in cardiac rehab. She was asymptomatic but noted that her heart rate was elevated. ECG showed afib with RVR. Her metoprolol was increased and she was started on diltiazem. Patient has a CHADS2VASC score of 5. She denies snoring or alcohol use.   Patient started having symptoms of lightheadedness with an "unusual" feeling in her chest on 04/08/22. She noted her heart rate was elevated and called EMS which confirmed she was out of rhythm. She elected to not go to the ED and called [facility]. Her Eliquis was resumed and her metoprolol was increased which resolved her symptoms. She was seen by Dr Elberta Fortis and was scheduled for ablation on 10/04/23 but she cancelled her procedure as her husband was ill in the hospital and passed away during that admission.   Patient returns for follow up for atrial fibrillation. She remains in afib but is minimally symptomatic. She checks her heart rate frequently at home and her rates are typically well controlled. No bleeding issues on anticoagulation.   Today, he denies symptoms of palpitations, chest pain, shortness of breath, orthopnea, PND, lower extremity edema, dizziness, presyncope, syncope, snoring, daytime somnolence, bleeding, or neurologic sequela. The patient is tolerating medications without difficulties and is otherwise without complaint today.    Atrial Fibrillation Risk Factors:  she does not have symptoms or diagnosis of sleep apnea. she does not have a history of rheumatic fever. she does not have a history of alcohol use. The patient does have a  history of early familial atrial fibrillation or other arrhythmias. Brother has afib and PPM.   Atrial Fibrillation Management history:  Previous antiarrhythmic drugs: none Previous cardioversions: none Previous ablations: none Anticoagulation history: Eliquis   Past Medical History:  Diagnosis Date   Adenomatous polyps 07/1997   Breast fibroadenoma    CAD (coronary artery disease) 03/18/2014   STEMI- stent to RCA and nonobstructive disease LAD and LCX   External hemorrhoids    GERD (gastroesophageal reflux disease)    LA Class Grade C Erosive esophagitis   Heart attack (HCC) 2015   Hiatal hernia    HTN (hypertension)    Hyperlipidemia    Internal hemorrhoids    Osteopenia 04/2018   T score -1.9 FRAX 21% / 5%   Peptic stricture of esophagus    Skin cancer 2014   "forehead"   ST elevation myocardial infarction (STEMI) involving right coronary artery in recovery phase (HCC) 03/18/2014    Current Outpatient Medications  Medication Sig Dispense Refill   apixaban (ELIQUIS) 5 MG TABS tablet Take 1 tablet (5 mg total) by mouth 2 (two) times daily. 180 tablet 1   b complex vitamins capsule Take 1 capsule by mouth daily.     BIOTIN PO Take 1 tablet by mouth daily.     CALCIUM-VITAMIN D PO Take 1 tablet by mouth 2 (two) times daily.      clopidogrel (PLAVIX) 75 MG tablet TAKE 1 TABLET(75 MG) BY MOUTH AT BEDTIME. 90 tablet 3   estradiol (ESTRACE) 0.1 MG/GM vaginal cream PLACE 1 GRAM VAGINALLY 3 TIMES A WEEK (Patient taking differently: Place 1 g vaginally once  a week.) 42.5 g 0   famotidine (PEPCID) 20 MG tablet Take 20 mg by mouth daily before supper.     Menthol-Zinc Oxide (CALMOSEPTINE EX) Apply 1 Application topically daily as needed (irritation).     metoprolol succinate (TOPROL-XL) 25 MG 24 hr tablet Take 1/2 tablet by mouth in the morning, noon and at bedtime 135 tablet 2   nitroGLYCERIN (NITROSTAT) 0.4 MG SL tablet Place 1 tablet (0.4 mg total) under the tongue every 5 (five)  minutes as needed for chest pain. 25 tablet 5   pantoprazole (PROTONIX) 40 MG tablet TAKE 1 TABLET(40 MG) BY MOUTH DAILY 90 tablet 0   Pramoxine-Menthol-Dimethicone (GOLD BOND MEDICATED ANTI ITCH EX) Apply 1 Application topically daily as needed (itching).     rosuvastatin (CRESTOR) 5 MG tablet TAKE 1 TABLET BY MOUTH DAILY 5 DAYS A WEEK TO DAILY AS TOLERATED 90 tablet 2   metoprolol tartrate (LOPRESSOR) 25 MG tablet Take 1 tablet (25 mg total) by mouth every 6 (six) hours as needed (for afib HR over 100). 45 tablet 1   No current facility-administered medications for this encounter.    ROS- All systems are reviewed and negative except as per the HPI above.  Physical Exam: Vitals:   11/01/23 1326  BP: (!) 132/90  Pulse: (!) 110  Weight: 62.2 kg  Height: 5\' 1"  (1.549 m)    GEN: Well nourished, well developed in no acute distress CARDIAC: Irregularly irregular rate and rhythm, no murmurs, rubs, gallops RESPIRATORY:  Clear to auscultation without rales, wheezing or rhonchi  ABDOMEN: Soft, non-tender, non-distended EXTREMITIES:  No edema; No deformity    Wt Readings from Last 3 Encounters:  11/01/23 62.2 kg  07/18/23 61.7 kg  07/15/23 62.2 kg    EKG today demonstrates  Typical atrial flutter with variable block, RBBB Vent. rate 110 BPM PR interval 144 ms QRS duration 120 ms QT/QTcB 376/508 ms   Echo 05/28/22 demonstrated   1. Left ventricular ejection fraction, by estimation, is 55 to 60%. The  left ventricle has normal function. The left ventricle has no regional  wall motion abnormalities. Left ventricular diastolic parameters were  normal.   2. Right ventricular systolic function is normal. The right ventricular  size is normal. There is normal pulmonary artery systolic pressure.   3. Left atrial size was mildly dilated.   4. The mitral valve is normal in structure. Trivial mitral valve  regurgitation. No evidence of mitral stenosis.   5. The aortic valve is tricuspid.  Aortic valve regurgitation is mild to  moderate. No aortic stenosis is present.   6. The inferior vena cava is normal in size with greater than 50%  respiratory variability, suggesting right atrial pressure of 3 mmHg.   Epic records are reviewed at length today  CHA2DS2-VASc Score = 5  The patient's score is based upon: CHF History: 0 HTN History: 1 Diabetes History: 0 Stroke History: 0 Vascular Disease History: 1 Age Score: 2 Gender Score: 1       ASSESSMENT AND PLAN: Paroxysmal Atrial Fibrillation/atrial flutter The patient's CHA2DS2-VASc score is 5, indicating a 7.2% annual risk of stroke.   Patient in typical atrial flutter today. We revisited rhythm control options including AAD (Multaq, dofetilide, amiodarone) or ablation. She does not want to start any new medications. She is considering rescheduling her ablation but is not ready to proceed right now with the recent passing of her husband. She will discuss with Dr Elberta Fortis at upcoming visit.  Continue Toprol 12.5  mg TID with Lopressor 25 mg PRN for afib episodes. Continue Eliquis 5 mg BID Kardia mobile for home monitoring.  Secondary Hypercoagulable State (ICD10:  D68.69) The patient is at significant risk for stroke/thromboembolism based upon her CHA2DS2-VASc Score of 5.  Continue Apixaban (Eliquis). No bleeding issues.  CAD S/p MI 2015 with PCI to RCA No anginal symptoms Followed by Dr Eden Emms  HTN Stable on current regimen   Follow up with Dr Elberta Fortis as scheduled.    [name] PA-C [facility] [address] [place], [place] [address] [phone] 11/01/2023 1:36 PM

## 2023-11-07 ENCOUNTER — Other Ambulatory Visit (HOSPITAL_COMMUNITY): Payer: Self-pay

## 2023-11-07 ENCOUNTER — Encounter: Payer: Self-pay | Admitting: Nurse Practitioner

## 2023-11-07 ENCOUNTER — Telehealth: Payer: Self-pay | Admitting: *Deleted

## 2023-11-07 ENCOUNTER — Ambulatory Visit: Payer: PPO | Admitting: Nurse Practitioner

## 2023-11-07 VITALS — BP 134/88 | HR 80 | Resp 16 | Ht 61.5 in | Wt 137.0 lb

## 2023-11-07 DIAGNOSIS — Z4689 Encounter for fitting and adjustment of other specified devices: Secondary | ICD-10-CM | POA: Diagnosis not present

## 2023-11-07 DIAGNOSIS — R300 Dysuria: Secondary | ICD-10-CM

## 2023-11-07 DIAGNOSIS — N3000 Acute cystitis without hematuria: Secondary | ICD-10-CM

## 2023-11-07 DIAGNOSIS — N8110 Cystocele, unspecified: Secondary | ICD-10-CM | POA: Diagnosis not present

## 2023-11-07 DIAGNOSIS — N952 Postmenopausal atrophic vaginitis: Secondary | ICD-10-CM

## 2023-11-07 MED ORDER — SULFAMETHOXAZOLE-TRIMETHOPRIM 800-160 MG PO TABS
1.0000 | ORAL_TABLET | Freq: Two times a day (BID) | ORAL | 0 refills | Status: AC
  Filled 2023-11-07: qty 6, 3d supply, fill #0

## 2023-11-07 NOTE — Progress Notes (Signed)
   Acute Office Visit  Subjective:    Patient ID: Sarah Ray, female    DOB: 1940-12-23, 83 y.o.   MRN: [id number]   HPI 83 y.o. presents today for burning with urination, pelvic pressure that started 2-3 weeks ago. Denies flank pain, fever, hematuria, frequency. Typically uses pessary. This was removed by patient in early January and felt she was having more leakage with it in, so she decided to keep out. When she has tried to reinsert it is painful and she bleeds. Would like smaller size. Currently using #3 ring pessary with support. Using vaginal estrogen weekly.   No LMP recorded. Patient has had a hysterectomy.    Review of Systems  Constitutional: Negative.   Genitourinary:  Positive for dysuria, frequency, pelvic pain (Pressure) and vaginal bleeding (with pessary maintenance). Negative for difficulty urinating, flank pain, hematuria, urgency, vaginal discharge and vaginal pain.       Objective:    Physical Exam Constitutional:      Appearance: Normal appearance.  Genitourinary:    General: Normal vulva.     Vagina: No vaginal discharge, erythema or bleeding.     Uterus: Absent.      Comments: Atrophic changes, grade 2 cystocele    BP 134/88   Pulse 80   Resp 16   Ht 5' 1.5" (1.562 m)   Wt 137 lb (62.1 kg)   BMI 25.47 kg/m  Wt Readings from Last 3 Encounters:  11/07/23 137 lb (62.1 kg)  11/01/23 137 lb 3.2 oz (62.2 kg)  07/18/23 136 lb (61.7 kg)        Patient informed chaperone available to be present for breast and/or pelvic exam. Patient has requested no chaperone to be present. Patient has been advised what will be completed during breast and pelvic exam.   UA: 3+ leukocytes, neg blood, neg nitrites, yellow/cloudy. Microscopic: wbc 40-60, rbc 0-2, moderate bacteria  Assessment & Plan:   Problem List Items Addressed This Visit       Genitourinary   UTI (urinary tract infection) - Primary   Relevant Medications   sulfamethoxazole-trimethoprim (BACTRIM  DS) 800-160 MG tablet   Other Visit Diagnoses       Burning with urination       Relevant Orders   Urinalysis,Complete w/RFL Culture (Completed)     Pessary maintenance         Pelvic organ prolapse quantification stage 2 cystocele          Plan: Acute cystitis - Bactrim 800-160 mg BID x 3 days. Culture pending. #2 ring pessary not available for sizing today. Offered to order and have her return but she was OK with taking #2 with her today. 1 gram vaginal estrogen applied to pessary, inserted with ease. Urinated and walked with no issues. Continue vaginal estrogen.   Return in about 1 month (around 12/05/2023) for pessary maintenance.    Olivia Mackie DNP, 4:26 PM 11/07/2023

## 2023-11-07 NOTE — Telephone Encounter (Signed)
Call returned to patient.  Pessary placed previously. Patient states pessary removed 09/29/23 during OV. Reports burning sensation, pelvic pressure and pain with urination. Spouse passed away 10/24/2023, has increased her activity and on her feet more. Symptoms started end of Jan, first of Feb. Denies flank pain, fever/chills. OV scheduled by front office with Tiffany for today at 4pm.   Advised patient to keep OV as scheduled, will update Tiffany.   Patient appreciative of call.   Routing to provider for final review. Patient is agreeable to disposition. Will close encounter.

## 2023-11-08 ENCOUNTER — Other Ambulatory Visit (HOSPITAL_COMMUNITY): Payer: Self-pay

## 2023-11-08 MED ORDER — ERYTHROMYCIN 5 MG/GM OP OINT
TOPICAL_OINTMENT | OPHTHALMIC | 1 refills | Status: DC
Start: 2023-11-08 — End: 2024-02-24
  Filled 2023-11-08: qty 3.5, 7d supply, fill #0
  Filled 2024-01-10: qty 3.5, 7d supply, fill #1

## 2023-11-09 ENCOUNTER — Other Ambulatory Visit (HOSPITAL_COMMUNITY): Payer: Self-pay

## 2023-11-09 LAB — URINE CULTURE
MICRO NUMBER:: 16092465
SPECIMEN QUALITY:: ADEQUATE

## 2023-11-09 LAB — URINALYSIS, COMPLETE W/RFL CULTURE
Bilirubin Urine: NEGATIVE
Glucose, UA: NEGATIVE
Hyaline Cast: NONE SEEN /LPF
Ketones, ur: NEGATIVE
Nitrites, Initial: NEGATIVE
Protein, ur: NEGATIVE
Specific Gravity, Urine: 1.01 (ref 1.001–1.035)
pH: 6 (ref 5.0–8.0)

## 2023-11-09 LAB — CULTURE INDICATED

## 2023-11-10 ENCOUNTER — Encounter: Payer: Self-pay | Admitting: Nurse Practitioner

## 2023-11-14 ENCOUNTER — Telehealth: Payer: Self-pay | Admitting: *Deleted

## 2023-11-14 ENCOUNTER — Other Ambulatory Visit: Payer: Self-pay | Admitting: Nurse Practitioner

## 2023-11-14 ENCOUNTER — Other Ambulatory Visit (HOSPITAL_BASED_OUTPATIENT_CLINIC_OR_DEPARTMENT_OTHER): Payer: Self-pay

## 2023-11-14 DIAGNOSIS — N3000 Acute cystitis without hematuria: Secondary | ICD-10-CM

## 2023-11-14 MED ORDER — SULFAMETHOXAZOLE-TRIMETHOPRIM 800-160 MG PO TABS
1.0000 | ORAL_TABLET | Freq: Two times a day (BID) | ORAL | 0 refills | Status: AC
  Filled 2023-11-14: qty 6, 3d supply, fill #0

## 2023-11-14 NOTE — Telephone Encounter (Signed)
 Spoke with patient. Patient completed abx for ecoli UTI, feeling better, still experiencing burning sensation when voiding. Denies any other symptoms. Asking if additional abx needed? Pharmacy updated. Has appt at 10am, will not be available by phone, request detailed message if no answer.    Tiffany -please review and advise.

## 2023-11-14 NOTE — Telephone Encounter (Signed)
 Call returned to patient, left detailed message, advised per Tiffany. Return call if any additional questions.   Encounter closed.

## 2023-11-14 NOTE — Telephone Encounter (Signed)
 Provided 3 more days of Bactrim. If symptoms continue or return OV recommended.

## 2023-11-15 ENCOUNTER — Other Ambulatory Visit (HOSPITAL_BASED_OUTPATIENT_CLINIC_OR_DEPARTMENT_OTHER): Payer: Self-pay

## 2023-11-15 ENCOUNTER — Encounter: Payer: Self-pay | Admitting: Gastroenterology

## 2023-11-15 ENCOUNTER — Ambulatory Visit: Payer: PPO | Admitting: Gastroenterology

## 2023-11-15 VITALS — BP 138/80 | HR 76 | Ht 61.0 in | Wt 137.4 lb

## 2023-11-15 DIAGNOSIS — I480 Paroxysmal atrial fibrillation: Secondary | ICD-10-CM | POA: Diagnosis not present

## 2023-11-15 DIAGNOSIS — K219 Gastro-esophageal reflux disease without esophagitis: Secondary | ICD-10-CM | POA: Diagnosis not present

## 2023-11-15 DIAGNOSIS — Z7901 Long term (current) use of anticoagulants: Secondary | ICD-10-CM

## 2023-11-15 DIAGNOSIS — Z860101 Personal history of adenomatous and serrated colon polyps: Secondary | ICD-10-CM | POA: Diagnosis not present

## 2023-11-15 DIAGNOSIS — Z8719 Personal history of other diseases of the digestive system: Secondary | ICD-10-CM

## 2023-11-15 DIAGNOSIS — I2510 Atherosclerotic heart disease of native coronary artery without angina pectoris: Secondary | ICD-10-CM

## 2023-11-15 MED ORDER — PANTOPRAZOLE SODIUM 40 MG PO TBEC
40.0000 mg | DELAYED_RELEASE_TABLET | Freq: Every day | ORAL | 3 refills | Status: DC
  Filled 2023-11-15 – 2024-01-10 (×4): qty 90, 90d supply, fill #0

## 2023-11-15 NOTE — Patient Instructions (Addendum)
 _______________________________________________________  If your blood pressure at your visit was 140/90 or greater, please contact your primary care physician to follow up on this. _______________________________________________________  If you are age 85 or older, your body mass index should be between 23-30. Your Body mass index is 25.96 kg/m. If this is out of the aforementioned range listed, please consider follow up with your Primary Care Provider. ________________________________________________________  We have sent the following medications to your pharmacy for you to pick up at your convenience:  Protonix  You will follow up in our office in 1 year.  We will contact you to schedule this appointment.  The  GI providers would like to encourage you to use [facility] to communicate with providers for non-urgent requests or questions.  Due to long hold times on the telephone, sending your provider a message by [facility] may be a faster and more efficient way to get a response.  Please allow 48 business hours for a response.  Please remember that this is for non-urgent requests.  _______________________________________________________  It was a pleasure to see you today!  Vito Cirigliano, D.O.

## 2023-11-15 NOTE — Progress Notes (Signed)
 Chief Complaint:    GERD   HPI:     Patient is a 83 y.o. female with a history of CAD s/p MI 2015 with PCI to RCA (on Plavix), HTN, HLD, paroxysmal A-fib (on Eliquis) presenting to the Gastroenterology Clinic for routine follow-up.  She was last seen by Dr. Russella Dar on 07/27/2022.  Longstanding history of reflux, well-controlled with Protonix 40 mg daily and Pepcid 20 mg in the evening.  She is otherwise without any active issues today.  Sadly, her husband passed away abruptly in October 07, 2023.  She has been recovering slowly and has plenty of local family support to help her.    Has f/u with her PCM next month.   Review of systems:     No chest pain, no SOB, no fevers, no urinary sx   Past Medical History:  Diagnosis Date   Adenomatous polyps 07/1997   Breast fibroadenoma    CAD (coronary artery disease) 03/18/2014   STEMI- stent to RCA and nonobstructive disease LAD and LCX   External hemorrhoids    GERD (gastroesophageal reflux disease)    LA Class Grade C Erosive esophagitis   Heart attack (HCC) 2015   Hiatal hernia    HTN (hypertension)    Hyperlipidemia    Internal hemorrhoids    Osteopenia 04/2018   T score -1.9 FRAX 21% / 5%   Peptic stricture of esophagus    Skin cancer 2014   "forehead"   ST elevation myocardial infarction (STEMI) involving right coronary artery in recovery phase (HCC) 03/18/2014    Patient's surgical history, family medical history, social history, medications and allergies were all reviewed in Epic    Current Outpatient Medications  Medication Sig Dispense Refill   apixaban (ELIQUIS) 5 MG TABS tablet Take 1 tablet (5 mg total) by mouth 2 (two) times daily. 180 tablet 1   b complex vitamins capsule Take 1 capsule by mouth daily.     BIOTIN PO Take 1 tablet by mouth daily.     CALCIUM-VITAMIN D PO Take 1 tablet by mouth 2 (two) times daily.      clopidogrel (PLAVIX) 75 MG tablet Take 1 tablet (75 mg total) by mouth at bedtime. 90 tablet 3    estradiol (ESTRACE) 0.1 MG/GM vaginal cream PLACE 1 GRAM VAGINALLY 3 TIMES A WEEK (Patient taking differently: Place 1 g vaginally once a week.) 42.5 g 0   famotidine (PEPCID) 20 MG tablet Take 1 tablet (20 mg total) by mouth 2 (two) times daily. 180 tablet 3   Menthol-Zinc Oxide (CALMOSEPTINE EX) Apply 1 Application topically daily as needed (irritation).     metoprolol succinate (TOPROL-XL) 25 MG 24 hr tablet Take 1/2 tablet by mouth in the morning, noon and at bedtime 135 tablet 2   metoprolol tartrate (LOPRESSOR) 25 MG tablet Take 1 tablet (25 mg total) by mouth every 6 (six) hours as needed for afib hr over 100. 45 tablet 1   nitroGLYCERIN (NITROSTAT) 0.4 MG SL tablet Place 1 tablet (0.4 mg total) under the tongue every 5 (five) minutes as needed for chest pain. 25 tablet 5   pantoprazole (PROTONIX) 40 MG tablet TAKE 1 TABLET(40 MG) BY MOUTH DAILY 90 tablet 0   Pramoxine-Menthol-Dimethicone (GOLD BOND MEDICATED ANTI ITCH EX) Apply 1 Application topically daily as needed (itching).     rosuvastatin (CRESTOR) 5 MG tablet TAKE 1 TABLET BY MOUTH DAILY 5 DAYS A WEEK TO DAILY AS TOLERATED 90 tablet 2   sulfamethoxazole-trimethoprim (BACTRIM DS) 800-160 MG  tablet Take 1 tablet by mouth 2 (two) times daily for 3 days. 6 tablet 0   erythromycin ophthalmic ointment APPLY TO LEFT EYE 3 TIMES DAILY AS DIRECTED (Patient not taking: Reported on 11/15/2023) 3.5 g 1   No current facility-administered medications for this visit.    Physical Exam:     BP 138/80   Pulse 76   Ht 5\' 1"  (1.549 m)   Wt 137 lb 6.4 oz (62.3 kg)   BMI 25.96 kg/m   GENERAL:  Pleasant female in NAD PSYCH: : Cooperative, normal affect EENT:  conjunctiva pink, mucous membranes moist CARDIAC: Irregularly irregular, regular rate, no murmur heard PULM: Normal respiratory effort, lungs CTA bilaterally, no wheezing ABDOMEN:  Nondistended, soft, nontender. No obvious masses, no hepatomegaly,  normal bowel sounds NEURO: Alert and  oriented x 3, no focal neurologic deficits   IMPRESSION and PLAN:    1) GERD - Well-controlled on current therapy - Refill placed for Protonix 40 mg daily, #90, RF3  2) History of duodenal AVM EGD in 05/2018 with incidentally noted small, nonbleeding duodenal AVM.  Has otherwise never had a GI bleed.  No recent bleeding and recent CBC reviewed and stable H/H 13/39 - Conservative management - If any concern for overt bleeding, to call the office or come to the ER, particular given antiplatelet therapy and anticoagulation  3) History of colon polyps - Last colonoscopy was 04/2019 and notable for a single subcentimeter adenoma and a small hyperplastic polyp.  No repeat recommended due to age  30) Paroxysmal A-fib 5) CAD 6) Chronic anticoagulation 7) Chronic antiplatelet therapy - Follows regularly in the Cardiology clinic  RTC in 1 year or sooner prn          Shellia Cleverly ,DO, FACG 11/15/2023, 9:37 AM

## 2023-11-17 ENCOUNTER — Other Ambulatory Visit (HOSPITAL_BASED_OUTPATIENT_CLINIC_OR_DEPARTMENT_OTHER): Payer: Self-pay

## 2023-11-30 ENCOUNTER — Other Ambulatory Visit: Payer: Self-pay | Admitting: Ophthalmology

## 2023-12-02 LAB — DERMATOLOGY PATHOLOGY

## 2023-12-05 ENCOUNTER — Other Ambulatory Visit (HOSPITAL_BASED_OUTPATIENT_CLINIC_OR_DEPARTMENT_OTHER): Payer: Self-pay

## 2023-12-05 MED FILL — Clopidogrel Bisulfate Tab 75 MG (Base Equiv): ORAL | 90 days supply | Qty: 90 | Fill #0 | Status: AC

## 2023-12-06 ENCOUNTER — Ambulatory Visit (INDEPENDENT_AMBULATORY_CARE_PROVIDER_SITE_OTHER): Payer: PPO | Admitting: Radiology

## 2023-12-06 VITALS — BP 134/66 | Wt 140.6 lb

## 2023-12-06 DIAGNOSIS — N3000 Acute cystitis without hematuria: Secondary | ICD-10-CM

## 2023-12-06 LAB — URINALYSIS, COMPLETE W/RFL CULTURE
Bacteria, UA: NONE SEEN /HPF
Bilirubin Urine: NEGATIVE
Glucose, UA: NEGATIVE
Hyaline Cast: NONE SEEN /LPF
Ketones, ur: NEGATIVE
Leukocyte Esterase: NEGATIVE
Nitrites, Initial: NEGATIVE
Protein, ur: NEGATIVE
RBC / HPF: NONE SEEN /HPF (ref 0–2)
Specific Gravity, Urine: 1.01 (ref 1.001–1.035)
WBC, UA: NONE SEEN /HPF (ref 0–5)
pH: 6 (ref 5.0–8.0)

## 2023-12-06 LAB — NO CULTURE INDICATED

## 2023-12-06 NOTE — Progress Notes (Signed)
   Subjective:     Chief Complaint:  Chief Complaint  Patient presents with   Pessary Check    Unsure if UTI has resolved.  Has left pessary out since last office visit.     History of Present Illness: Sarah Ray is a 83 y.o. female with stage III pelvic organ prolapse who presents for a pessary check. She was using a size 3 ring with support pessary. She has left the pessary out since January. Was scheduled for cardiac ablation but her husband passed unexpectedly and she cancelled. She is still using vaginal estrogen. She denies vaginal bleeding. Was treated for a Uti last month, wants to be sure that has resolved.  Past Medical History: Patient  has a past medical history of Adenomatous polyps (07/1997), Breast fibroadenoma, CAD (coronary artery disease) (03/18/2014), External hemorrhoids, GERD (gastroesophageal reflux disease), Heart attack (HCC) (2015), Hiatal hernia, HTN (hypertension), Hyperlipidemia, Internal hemorrhoids, Osteopenia (04/2018), Peptic stricture of esophagus, Skin cancer (2014), and ST elevation myocardial infarction (STEMI) involving right coronary artery in recovery phase (HCC) (03/18/2014).   Past Surgical History: She  has a past surgical history that includes Thyroid cyst excision (1970's); Bunionectomy (Bilateral, 04/04/2013); Coronary angioplasty with stent (03/18/14); Tonsillectomy and adenoidectomy (~ 1950); Abdominal hysterectomy (1977); Breast cyst aspiration (Right, ~ 2011); Esophagogastroduodenoscopy (egd) with esophageal dilation (2000's X 1); Skin cancer excision (2014); left heart cath (N/A, 03/18/2014); left heart catheterization with coronary angiogram (N/A, 04/22/2014); and Cardiac catheterization (N/A, 09/29/2016).   Medications: She has a current medication list which includes the following prescription(s): apixaban, b complex vitamins, biotin, calcium carbonate-vitamin d, clopidogrel, estradiol, famotidine, menthol-zinc oxide, metoprolol succinate,  metoprolol tartrate, nitroglycerin, pantoprazole, pramoxine-menthol-dimethicone, rosuvastatin, and erythromycin.   Allergies: Patient is allergic to naprosyn [naproxen], ticagrelor, aspirin, atorvastatin, other, bempedoic acid, covid-19 (mrna) vaccine, gabapentin, milk-related compounds, and praluent [alirocumab].       Objective:    Physical Exam: BP 134/66 (BP Location: Left Arm, Patient Position: Sitting, Cuff Size: Normal)   Wt 140 lb 9.6 oz (63.8 kg)   BMI 26.57 kg/m  Gen: No apparent distress, A&O x 3. Detailed Urogynecologic Evaluation:  Pelvic Exam: Normal external female genitalia; Bartholin's and Skene's glands normal in appearance; urethral meatus normal in appearance, no urethral masses, no discharge.  Speculum exam revealed no lesions in the vagina. The pessary was not replaced as patient would like to leave it out for now until it becomes bothersome.   Assessment/Plan:    1. Pelvic organ prolapse quantification stage 3 cystocele Will leave pessary out for now and call when she is ready to have one refitted- will plan for an incontinence dish to help with leakage. Pt agreeable to plan.    Arlie Solomons, [facility]

## 2024-01-08 NOTE — Progress Notes (Signed)
 Electrophysiology Office Note:   Date:  01/09/2024  ID:  Sarah Ray, DOB Jan 13, 1941, MRN [id number]  Primary Cardiologist: Janelle Mediate, MD Primary Heart Failure: None Electrophysiologist: Nakota Elsen Cortland Ding, MD      History of Present Illness:   Sarah Ray is a 83 y.o. female with h/o coronary artery disease, hypertension, hyperlipidemia, atrial fibrillation seen today for routine electrophysiology followup.   Since last being seen in our clinic the patient reports doing well.  She has no chest pain or shortness of breath.  She is able to do all her daily activities without restriction.  Unfortunately, her husband died in 11/07/23.  He was quite ill before that.  She has been trying to deal with her grief.  She continues to do her daily activities, though has noted that she is more tired and fatigued.  She is sleeping more.  She feels that she is sleeping to escape thinking of her husband.  she denies chest pain, palpitations, dyspnea, PND, orthopnea, nausea, vomiting, dizziness, syncope, edema, weight gain, or early satiety.   Review of systems complete and found to be negative unless listed in HPI.   EP Information / Studies Reviewed:    EKG is ordered today. Personal review as below.  EKG Interpretation Date/Time:  Monday January 09 2024 10:54:27 EDT Ventricular Rate:  96 PR Interval:    QRS Duration:  118 QT Interval:  380 QTC Calculation: 480 R Axis:   66  Text Interpretation: Atrial fibrillation Low voltage QRS Right bundle branch block T wave abnormality, consider inferior ischemia Abnormal ECG When compared with ECG of 01-Nov-2023 13:30, Atrial fibrillation Confirmed by Sarah Ray ([id number]) on 01/09/2024 10:56:52 AM     Risk Assessment/Calculations:    CHA2DS2-VASc Score = 5   This indicates a 7.2% annual risk of stroke. The patient's score is based upon: CHF History: 0 HTN History: 1 Diabetes History: 0 Stroke History: 0 Vascular Disease History: 1 Age Score:  2 Gender Score: 1      Physical Exam:   VS:  BP (!) 140/88   Pulse 96   Ht 5\' 1"  (1.549 m)   Wt 137 lb (62.1 kg)   SpO2 97%   BMI 25.89 kg/m    Wt Readings from Last 3 Encounters:  01/09/24 137 lb (62.1 kg)  12/06/23 140 lb 9.6 oz (63.8 kg)  11/15/23 137 lb 6.4 oz (62.3 kg)     GEN: Well nourished, well developed in no acute distress NECK: No JVD; No carotid bruits CARDIAC: Irregularly irregular rate and rhythm, no murmurs, rubs, gallops RESPIRATORY:  Clear to auscultation without rales, wheezing or rhonchi  ABDOMEN: Soft, non-tender, non-distended EXTREMITIES:  No edema; No deformity   ASSESSMENT AND PLAN:    1.  Persistent atrial fibrillation/typical atrial flutter: She would like to get back into normal rhythm eventually.  Unfortunately, in 11-07-23 her husband passed away.  She has been having some emotional issues with this and has not quite ready to have a procedure.  She is overall well rate controlled.  Sarah Ray continue with her current medications.  Sarah Ray see her back in a few months to determine if she is ready for ablation.  I have told her that if she changes her mind that she can call and we Sarah Ray move forward with scheduling ablation.  I did offer medication management for rhythm control, but she would prefer to avoid this for now.  2.  Secondary hypercoagulable state: Currently on Eliquis  for atrial fibrillation  3.  Coronary artery disease: No current chest pain.  Plan per primary cardiology.  4.  Hypertension: Mildly elevated today.  Usually well-controlled.  No changes.   Follow up with Dr. Lawana Pray in 3 months  Signed, Sarah Rolston Cortland Ding, MD

## 2024-01-09 ENCOUNTER — Encounter: Payer: Self-pay | Admitting: Cardiology

## 2024-01-09 ENCOUNTER — Other Ambulatory Visit (HOSPITAL_BASED_OUTPATIENT_CLINIC_OR_DEPARTMENT_OTHER): Payer: Self-pay

## 2024-01-09 ENCOUNTER — Ambulatory Visit: Payer: PPO | Attending: Cardiology | Admitting: Cardiology

## 2024-01-09 VITALS — BP 140/88 | HR 96 | Ht 61.0 in | Wt 137.0 lb

## 2024-01-09 DIAGNOSIS — I10 Essential (primary) hypertension: Secondary | ICD-10-CM

## 2024-01-09 DIAGNOSIS — I2510 Atherosclerotic heart disease of native coronary artery without angina pectoris: Secondary | ICD-10-CM | POA: Diagnosis not present

## 2024-01-09 DIAGNOSIS — I480 Paroxysmal atrial fibrillation: Secondary | ICD-10-CM | POA: Diagnosis not present

## 2024-01-09 DIAGNOSIS — D6869 Other thrombophilia: Secondary | ICD-10-CM

## 2024-01-09 NOTE — Patient Instructions (Signed)
 Medication Instructions:  Your physician recommends that you continue on your current medications as directed. Please refer to the Current Medication list given to you today.  *If you need a refill on your cardiac medications before your next appointment, please call your pharmacy*   Lab Work: None ordered   Testing/Procedures: None ordered   Follow-Up: At [facility], you and your health needs are our priority.  As part of our continuing mission to provide you with exceptional heart care, we have created designated Provider Care Teams.  These Care Teams include your primary Cardiologist (physician) and Advanced Practice Providers (APPs -  Physician Assistants and Nurse Practitioners) who all work together to provide you with the care you need, when you need it.  Your next appointment:   3 month(s)  The format for your next appointment:   In Person  Provider:   Agatha Horsfall, MD    Thank you for choosing [facility]!!   Reece Cane, RN [phone]

## 2024-01-10 ENCOUNTER — Other Ambulatory Visit (HOSPITAL_BASED_OUTPATIENT_CLINIC_OR_DEPARTMENT_OTHER): Payer: Self-pay

## 2024-01-16 ENCOUNTER — Other Ambulatory Visit (HOSPITAL_BASED_OUTPATIENT_CLINIC_OR_DEPARTMENT_OTHER): Payer: Self-pay

## 2024-01-19 ENCOUNTER — Other Ambulatory Visit (HOSPITAL_BASED_OUTPATIENT_CLINIC_OR_DEPARTMENT_OTHER): Payer: Self-pay

## 2024-01-21 ENCOUNTER — Other Ambulatory Visit: Payer: Self-pay | Admitting: Gastroenterology

## 2024-01-23 ENCOUNTER — Other Ambulatory Visit (HOSPITAL_BASED_OUTPATIENT_CLINIC_OR_DEPARTMENT_OTHER): Payer: Self-pay

## 2024-01-23 MED ORDER — DOXYCYCLINE HYCLATE 100 MG PO CAPS
100.0000 mg | ORAL_CAPSULE | Freq: Two times a day (BID) | ORAL | 0 refills | Status: DC
Start: 2024-01-23 — End: 2024-01-27
  Filled 2024-01-23: qty 10, 5d supply, fill #0

## 2024-01-24 NOTE — Progress Notes (Signed)
 Evaluation Performed:  Follow-up visit  Date:  01/27/2024   ID:  Sarah Ray, DOB [birth year], MRN [id number]  Provider Location: Office  PCP:  Sarah Ray., MD  Cardiologist:  Sarah Mediate, MD   Electrophysiologist:  Sarah Pump, MD   Chief Complaint:  CAD/HLD Sarah Ray   History of Present Illness:    83 y.o.  y/o female with a history of CAD, s/p MI-RCA PCI June 2015. Cath done Jan 2018 showed patent RCA sites and non obstructive residual CAD. She was  admitted 05/04/18 with chest pain felt to be non cardiac. Intolerant to multiple statins with LDL above goal. Seen by lipid clinic and tried on Praluent .. This was stopped due to dizziness and flu like symptoms  Intolerant to Nexlitol with back pain Did not want to be enrolled in [employer] trial She has known GERD plavix  held without problems for EGD done 06/19/18 small polyps, small hiatal hernia and small area of angiodysplasia in duodenum     Having paresthesias in legs coreg  changed to lopressor  and zetia  held Also had burning feeling with praluent  LDL was 87 09/04/18   She has really failed all attempts at meds for her LDL She has been off everything   LDL 81 08/25/20  New onset PAF noted in cardiac rehab on 12/27/19 Asymptomatic with elevated HR Toprol  dose increased to 25 ,g daily and Cardizem  120 mg added Anticoagulation started with eliquis  Had 4 days diarrhea and Cardizem  d/c CHADVASC 5 PAF noted in relation to COVID vaccine When seen in Afib clinic 12/31/19 was in NSR  She is allergic to ASA and has been on low dose eliquis  and plavix  given her CAD   She never knew she was in afib   June 2021 Eliquis  d/c at patient request Risk of recurrence / stroke discussed  He husband passed in January 2025 He was a nice guy and patient of mine as well Very good sense of humor   Primarily followed by afib clinic and Dr Sarah Ray I last saw her in 2022. She has been in persistent afib Seen by [facility] 01/09/24 and deferred ablation as still  grieving from loss of husband She also deferred AAT.   Recent Moews surgery over right cheek for basal cell Did not stop DOAC for it Has labile BP and rates based on anxiety level    Past Medical History:  Diagnosis Date   Adenomatous polyps 07/1997   Breast fibroadenoma    CAD (coronary artery disease) 03/18/2014   STEMI- stent to RCA and nonobstructive disease LAD and LCX   External hemorrhoids    GERD (gastroesophageal reflux disease)    LA Class Grade C Erosive esophagitis   Heart attack (HCC) 2015   Hiatal hernia    HTN (hypertension)    Hyperlipidemia    Internal hemorrhoids    Osteopenia 04/2018   T score -1.9 FRAX 21% / 5%   Peptic stricture of esophagus    Skin cancer 2014   "forehead"   ST elevation myocardial infarction (STEMI) involving right coronary artery in recovery phase (HCC) 03/18/2014   Past Surgical History:  Procedure Laterality Date   ABDOMINAL HYSTERECTOMY  1977   Leiomyoma   BREAST CYST ASPIRATION Right ~ 2011   BUNIONECTOMY Bilateral 04/04/2013   CARDIAC CATHETERIZATION N/A 09/29/2016   Procedure: Left Heart Cath and Coronary Angiography;  Surgeon: Sarah Binning, MD;  Location[facility] INVASIVE CV LAB;  Service: Cardiovascular;  Laterality: N/A;   CORONARY ANGIOPLASTY  WITH STENT PLACEMENT  03/18/14   Xience DES to totally occl RCA, residual non obstructive disease to LAD and LCX   ESOPHAGOGASTRODUODENOSCOPY (EGD) WITH ESOPHAGEAL DILATION  2000's X 1   LEFT HEART CATH N/A 03/18/2014   Procedure: LEFT HEART CATH;  Surgeon: Sarah Bellman, MD;  Location: [facility] CATH LAB;  Service: Cardiovascular;  Laterality: N/A;   LEFT HEART CATHETERIZATION WITH CORONARY ANGIOGRAM N/A 04/22/2014   Procedure: LEFT HEART CATHETERIZATION WITH CORONARY ANGIOGRAM;  Surgeon: Sarah Bellman, MD;  Location: [facility] CATH LAB;  Service: Cardiovascular;  Laterality: N/A;   SKIN CANCER EXCISION  2014   THYROID  CYST EXCISION  1970's   TONSILLECTOMY AND ADENOIDECTOMY  ~ 1950     Current Meds   Medication Sig   apixaban  (ELIQUIS ) 5 MG TABS tablet Take 1 tablet (5 mg total) by mouth 2 (two) times daily.   b complex vitamins capsule Take 1 capsule by mouth daily.   BIOTIN PO Take 1 tablet by mouth daily.   CALCIUM -VITAMIN D PO Take 1 tablet by mouth 2 (two) times daily.    clopidogrel  (PLAVIX ) 75 MG tablet Take 1 tablet (75 mg total) by mouth at bedtime.   erythromycin  ophthalmic ointment APPLY TO LEFT EYE 3 TIMES DAILY AS DIRECTED   estradiol  (ESTRACE ) 0.1 MG/GM vaginal cream PLACE 1 GRAM VAGINALLY 3 TIMES A WEEK   famotidine  (PEPCID ) 20 MG tablet Take 1 tablet (20 mg total) by mouth 2 (two) times daily.   Menthol-Zinc Oxide (CALMOSEPTINE EX) Apply 1 Application topically daily as needed (irritation).   metoprolol  succinate (TOPROL -XL) 25 MG 24 hr tablet Take 1/2 tablet by mouth in the morning, noon and at bedtime   metoprolol  tartrate (LOPRESSOR ) 25 MG tablet Take 1 tablet (25 mg total) by mouth every 6 (six) hours as needed for afib hr over 100.   nitroGLYCERIN  (NITROSTAT ) 0.4 MG SL tablet Place 1 tablet (0.4 mg total) under the tongue every 5 (five) minutes as needed for chest pain.   pantoprazole  (PROTONIX ) 40 MG tablet TAKE 1 TABLET(40 MG) BY MOUTH DAILY   Pramoxine-Menthol-Dimethicone (GOLD BOND MEDICATED ANTI ITCH EX) Apply 1 Application topically daily as needed (itching).   rosuvastatin  (CRESTOR ) 5 MG tablet TAKE 1 TABLET BY MOUTH DAILY 5 DAYS A WEEK TO DAILY AS TOLERATED     Allergies:   Naprosyn [naproxen], Ticagrelor , Aspirin , Atorvastatin , Other, Bempedoic acid , Covid-19 (mrna) vaccine, Gabapentin , Milk-related compounds, and Praluent  [alirocumab ]   Social History   Tobacco Use   Smoking status: Never    Passive exposure: Never   Smokeless tobacco: Never   Tobacco comments:    Never smoke 04/09/22  Vaping Use   Vaping status: Never Used  Substance Use Topics   Alcohol use: No    Alcohol/week: 0.0 standard drinks of alcohol   Drug use: No     Family Hx: The  patient's family history includes Asthma in her paternal grandmother; Breast cancer in her daughter and maternal aunt; Colon cancer in her maternal aunt; Colon polyps in her mother; Heart disease in her brother. There is no history of Esophageal cancer.  ROS:   Please see the history of present illness.     All other systems reviewed and are negative.   Prior CV studies:   The following studies were reviewed today:  Echo 05/04/18 EF normal trivial AR Cath 09/29/16  40-50% distal RCA 40-50% proximal LAD 50-70% D2 and 40% mid circumflex EF normal    Labs/Other Tests and Data Reviewed:  EKG: 01/27/2024 SR rate 64 normal    Recent Labs: 09/16/2023: BUN 11; Creatinine, Ser 0.82; Hemoglobin 13.2; Platelets 215; Potassium 3.9; Sodium 142   Recent Lipid Panel Lab Results  Component Value Date/Time   CHOL 158 01/16/2020 09:08 AM   TRIG 105 01/16/2020 09:08 AM   HDL 55 01/16/2020 09:08 AM   CHOLHDL 2.9 01/16/2020 09:08 AM   CHOLHDL 2.8 06/17/2016 08:48 AM   LDLCALC 84 01/16/2020 09:08 AM   LDLDIRECT 112 (H) 06/15/2018 11:39 AM    Wt Readings from Last 3 Encounters:  01/27/24 134 lb 9.6 oz (61.1 kg)  01/09/24 137 lb (62.1 kg)  12/06/23 140 lb 9.6 oz (63.8 kg)     Objective:    Vital Signs:  BP (!) 120/90   Pulse 84   Ht 5\' 1"  (1.549 m)   Wt 134 lb 9.6 oz (61.1 kg)   SpO2 98%   BMI 25.43 kg/m    Affect appropriate Healthy:  appears stated age Recent Moews surgery right maxilla area  Neck supple with no adenopathy JVP normal no bruits no thyromegaly Lungs clear with no wheezing and good diaphragmatic motion Heart:  S1/S2 no murmur, no rub, gallop or click PMI normal Abdomen: benighn, BS positve, no tenderness, no AAA no bruit.  No HSM or HJR Distal pulses intact with no bruits No edema Neuro non-focal Skin warm and dry No muscular weakness   ASSESSMENT & PLAN:    CAD:  Moderate by cath 09/29/16 no angina observe Patent RCA stent  Non ischemic myovue 05/28/22 EF 63%   HLD:  Intolerant to statins, praluent  and zetia  and Nexlitol observe GERD:  On protonix  f/u primary EGD 06/19/18 benign f/u Dr Sandrea Cruel most of her symptoms are post prandial and at night  PAF:  Temporally related to COVID vaccine CHADVASC 5  Back on eliquis  since 10/24/23 f/u [facility] for possible ablation She defers AAT Cardiac CTA 10/01/23 no LAA thrombus normal PV anatomy Increase toprol  and add PRN cardizem  short acting 30 mg bid for higher rates or elevated BP Anticoagulation: wight is borderline at 62 kg, Cr normal age > 41 will need to watch closely to lower eliquis  dose to 2.5 mg bid     Medication Adjustments/Labs and Tests Ordered: Current medicines are reviewed at length with the patient today.  Concerns regarding medicines are outlined above.  Tests Ordered:  None   Medication Changes:  Increase Toprol  add PRN cardizem   Disposition:  Follow up [facility]itz F/U with me in a year  Signed, Sarah Mediate, MD  01/27/2024 8:22 AM    [facility]

## 2024-01-27 ENCOUNTER — Encounter: Payer: Self-pay | Admitting: Cardiovascular Disease

## 2024-01-27 ENCOUNTER — Ambulatory Visit: Payer: PPO | Attending: Cardiovascular Disease | Admitting: Cardiovascular Disease

## 2024-01-27 ENCOUNTER — Other Ambulatory Visit (HOSPITAL_COMMUNITY): Payer: Self-pay

## 2024-01-27 VITALS — BP 120/90 | HR 84 | Ht 61.0 in | Wt 134.6 lb

## 2024-01-27 DIAGNOSIS — I480 Paroxysmal atrial fibrillation: Secondary | ICD-10-CM

## 2024-01-27 MED ORDER — DILTIAZEM HCL 30 MG PO TABS
30.0000 mg | ORAL_TABLET | Freq: Two times a day (BID) | ORAL | 3 refills | Status: DC | PRN
  Filled 2024-01-27: qty 30, 15d supply, fill #0

## 2024-01-27 NOTE — Patient Instructions (Addendum)
 Medication Instructions:  Your physician has recommended you make the following change in your medication:  1-TAKE Cardizem  30 mg by mouth twice daily as needed for palpitations.  *If you need a refill on your cardiac medications before your next appointment, please call your pharmacy*  Lab Work: If you have labs (blood work) drawn today and your tests are completely normal, you will receive your results only by: MyChart Message (if you have MyChart) OR A paper copy in the mail If you have any lab test that is abnormal or we need to change your treatment, we will call you to review the results.  Testing/Procedures: None ordered today.  Follow-Up: At [facility], you and your health needs are our priority.  As part of our continuing mission to provide you with exceptional heart care, our providers are all part of one team.  This team includes your primary Cardiologist (physician) and Advanced Practice Providers or APPs (Physician Assistants and Nurse Practitioners) who all work together to provide you with the care you need, when you need it.  Your next appointment:   6 months  Provider:   Janelle Mediate, MD    We recommend signing up for the patient portal called "MyChart".  Sign up information is provided on this After Visit Summary.  MyChart is used to connect with patients for Virtual Visits (Telemedicine).  Patients are able to view lab/test results, encounter notes, upcoming appointments, etc.  Non-urgent messages can be sent to your provider as well.   To learn more about what you can do with MyChart, go to [email or web address].

## 2024-02-24 ENCOUNTER — Ambulatory Visit (HOSPITAL_COMMUNITY)
Admission: RE | Admit: 2024-02-24 | Discharge: 2024-02-24 | Disposition: A | Discharge status: Home / Self Care | Source: Ambulatory Visit | Attending: Physician Assistant | Admitting: Physician Assistant

## 2024-02-24 ENCOUNTER — Encounter (HOSPITAL_COMMUNITY): Payer: Self-pay | Admitting: Physician Assistant

## 2024-02-24 VITALS — BP 138/90 | HR 86 | Ht 61.0 in | Wt 134.8 lb

## 2024-02-24 DIAGNOSIS — I480 Paroxysmal atrial fibrillation: Secondary | ICD-10-CM | POA: Diagnosis not present

## 2024-02-24 DIAGNOSIS — D6869 Other thrombophilia: Secondary | ICD-10-CM | POA: Diagnosis not present

## 2024-02-24 MED ORDER — METOPROLOL SUCCINATE ER 25 MG PO TB24: 25.0000 mg | ORAL_TABLET | Freq: Two times a day (BID) | ORAL | Status: DC

## 2024-02-24 NOTE — Patient Instructions (Signed)
Metoprolol succinate 25 mg twice a day

## 2024-02-24 NOTE — Progress Notes (Signed)
 Primary Care Physician: Sarah Ray., MD Primary Cardiologist: [name] Primary Electrophysiologist: [name]  Referring Physician: [name] is a 83 y.o. female with a history of CAD, HTN, HLD, and new onset paroxysmal atrial fibrillation who presents for follow up in the [facility].  The patient was initially diagnosed with atrial fibrillation on 12/27/19 in cardiac rehab. She was asymptomatic but noted that her heart rate was elevated. ECG showed afib with RVR. Her metoprolol  was increased and she was started on diltiazem . Patient has a CHADS2VASC score of 5. She denies snoring or alcohol use.   Patient started having symptoms of lightheadedness with an "unusual" feeling in her chest on 04/08/22. She noted her heart rate was elevated and called EMS which confirmed she was out of rhythm. She elected to not go to the ED and called [facility]. Her Eliquis  was resumed and her metoprolol  was increased which resolved her symptoms. She was seen by [name] and was scheduled for ablation on 10/04/23 but she cancelled her procedure as her husband was ill in the hospital and passed away during that admission.   Patient returns for follow up for atrial fibrillation. She remains in rate controlled afib with some fatigue. No bleeding issues on anticoagulation.   Today, she  denies symptoms of palpitations, chest pain, shortness of breath, orthopnea, PND, lower extremity edema, dizziness, presyncope, syncope, snoring, daytime somnolence, bleeding, or neurologic sequela. The patient is tolerating medications without difficulties and is otherwise without complaint today.    Atrial Fibrillation Risk Factors:  she does not have symptoms or diagnosis of sleep apnea. she does not have a history of rheumatic fever. she does not have a history of alcohol use. The patient does have a history of early familial atrial fibrillation or other arrhythmias. Brother has  afib and PPM.   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    CAD (coronary artery disease) 03/18/2014   STEMI- stent to RCA and nonobstructive disease LAD and LCX   External hemorrhoids    GERD (gastroesophageal reflux disease)    LA Class Grade C Erosive esophagitis   Heart attack (HCC) 2015   Hiatal hernia    HTN (hypertension)    Hyperlipidemia    Internal hemorrhoids    Osteopenia 04/2018   T score -1.9 FRAX 21% / 5%   Peptic stricture of esophagus    Skin cancer 2014   "forehead"   ST elevation myocardial infarction (STEMI) involving right coronary artery in recovery phase (HCC) 03/18/2014    Current Outpatient Medications  Medication Sig Dispense Refill   apixaban  (ELIQUIS ) 5 MG TABS tablet Take 1 tablet (5 mg total) by mouth 2 (two) times daily. 180 tablet 1   b complex vitamins capsule Take 1 capsule by mouth daily.     BIOTIN PO Take 1 tablet by mouth daily.     CALCIUM -VITAMIN D PO Take 1 tablet by mouth 2 (two) times daily.      clopidogrel  (PLAVIX ) 75 MG tablet Take 1 tablet (75 mg total) by mouth at bedtime. 90 tablet 3   estradiol  (ESTRACE ) 0.1 MG/GM vaginal cream PLACE 1 GRAM VAGINALLY 3 TIMES A WEEK 42.5 g 0   famotidine  (PEPCID ) 20 MG tablet Take 1 tablet (20 mg total) by mouth 2 (  two) times daily. 180 tablet 3   metoprolol  tartrate (LOPRESSOR ) 25 MG tablet Take 1 tablet (25 mg total) by mouth every 6 (six) hours as needed for afib hr over 100. 45 tablet 1   nitroGLYCERIN  (NITROSTAT ) 0.4 MG SL tablet Place 1 tablet (0.4 mg total) under the tongue every 5 (five) minutes as needed for chest pain. 25 tablet 5   pantoprazole  (PROTONIX ) 40 MG tablet TAKE 1 TABLET(40 MG) BY MOUTH DAILY 90 tablet 3   Pramoxine-Menthol-Dimethicone (GOLD BOND MEDICATED  ANTI ITCH EX) Apply 1 Application topically daily as needed (itching).     rosuvastatin  (CRESTOR ) 5 MG tablet TAKE 1 TABLET BY MOUTH DAILY 5 DAYS A WEEK TO DAILY AS TOLERATED 90 tablet 2   metoprolol  succinate (TOPROL -XL) 25 MG 24 hr tablet Take 1 tablet (25 mg total) by mouth 2 (two) times daily.     No current facility-administered medications for this encounter.    ROS- All systems are reviewed and negative except as per the HPI above.  Physical Exam: Vitals:   02/24/24 0912  BP: (!) 138/90  Pulse: 86  Weight: 61.1 kg  Height: 5\' 1"  (1.549 m)     GEN: Well nourished, well developed in no acute distress CARDIAC: Irregularly irregular rate and rhythm, no murmurs, rubs, gallops RESPIRATORY:  Clear to auscultation without rales, wheezing or rhonchi  ABDOMEN: Soft, non-tender, non-distended EXTREMITIES:  No edema; No deformity    Wt Readings from Last 3 Encounters:  02/24/24 61.1 kg  01/27/24 61.1 kg  01/09/24 62.1 kg    EKG today demonstrates  Afib, RBBB Vent. rate 86 BPM PR interval * ms QRS duration 122 ms QT/QTcB 386/461 ms   Echo 05/28/22 demonstrated   1. Left ventricular ejection fraction, by estimation, is 55 to 60%. The  left ventricle has normal function. The left ventricle has no regional  wall motion abnormalities. Left ventricular diastolic parameters were  normal.   2. Right ventricular systolic function is normal. The right ventricular  size is normal. There is normal pulmonary artery systolic pressure.   3. Left atrial size was mildly dilated.   4. The mitral valve is normal in structure. Trivial mitral valve  regurgitation. No evidence of mitral stenosis.   5. The aortic valve is tricuspid. Aortic valve regurgitation is mild to  moderate. No aortic stenosis is present.   6. The inferior vena cava is normal in size with greater than 50%  respiratory variability, suggesting right atrial pressure of 3 mmHg.   Epic records are reviewed at length  today  CHA2DS2-VASc Score = 5  The patient's score is based upon: CHF History: 0 HTN History: 1 Diabetes History: 0 Stroke History: 0 Vascular Disease History: 1 Age Score: 2 Gender Score: 1       ASSESSMENT AND PLAN: Persistent Atrial Fibrillation/atrial flutter The patient's CHA2DS2-VASc score is 5, indicating a 7.2% annual risk of stroke.   Patient in rate controlled afib today with symptoms of fatigue.  We revisited rhythm control options and discussed ablation in detail. She plans to discuss ablation with [name] again at her next visit and likely move forward with the procedure.  Continue Toprol  25 mg BID She did not tolerate diltiazem  due to dizziness. Continue Eliquis  5 mg BID Kardia mobile for home monitoring.   Secondary Hypercoagulable State (ICD10:  D68.69) The patient is at significant risk for stroke/thromboembolism based upon her CHA2DS2-VASc Score of 5.  Continue Apixaban  (Eliquis ). No bleeding issues.   CAD S/p  MI 2015 with PCI to RCA No anginal symptoms Followed by [name]  HTN Stable on current regimen   Follow up with [name] as scheduled.    Myrtha Ates PA-C [facility] [address] [place], [place] [address] [phone] 02/24/2024 1:06 PM

## 2024-02-27 ENCOUNTER — Other Ambulatory Visit (HOSPITAL_BASED_OUTPATIENT_CLINIC_OR_DEPARTMENT_OTHER): Payer: Self-pay

## 2024-02-27 MED FILL — Clopidogrel Bisulfate Tab 75 MG (Base Equiv): ORAL | 90 days supply | Qty: 90 | Fill #1 | Status: AC

## 2024-03-01 ENCOUNTER — Other Ambulatory Visit (HOSPITAL_BASED_OUTPATIENT_CLINIC_OR_DEPARTMENT_OTHER): Payer: Self-pay

## 2024-03-12 ENCOUNTER — Ambulatory Visit: Attending: Cardiology | Admitting: Cardiology

## 2024-03-12 ENCOUNTER — Encounter: Payer: Self-pay | Admitting: Cardiology

## 2024-03-12 VITALS — BP 140/68 | HR 94 | Ht 61.0 in | Wt 135.6 lb

## 2024-03-12 DIAGNOSIS — I2510 Atherosclerotic heart disease of native coronary artery without angina pectoris: Secondary | ICD-10-CM

## 2024-03-12 DIAGNOSIS — I4819 Other persistent atrial fibrillation: Secondary | ICD-10-CM

## 2024-03-12 DIAGNOSIS — D6869 Other thrombophilia: Secondary | ICD-10-CM

## 2024-03-12 DIAGNOSIS — I483 Typical atrial flutter: Secondary | ICD-10-CM

## 2024-03-12 NOTE — Progress Notes (Signed)
  Electrophysiology Office Note:   Date:  03/12/2024  ID:  Sarah Ray, DOB 02-26-1941, MRN [id number]  Primary Cardiologist: Maude Emmer, MD Primary Heart Failure: None Electrophysiologist: Abbegayle Denault Gladis Norton, MD      History of Present Illness:   Sarah Ray is a 83 y.o. female with h/o coronary artery disease, hypertension, hyperlipidemia, atrial fibrillation seen today for routine electrophysiology followup.   Since last being seen in our clinic the patient reports continued atrial fibrillation.  She does have fatigue and shortness of breath.  Despite this, she can do her daily activities.  She feels well when she is at rest.  She is continuing to mourn the loss of her husband which occurred a few months ago.  She would like a rhythm control strategy.  she denies chest pain, palpitations, dyspnea, PND, orthopnea, nausea, vomiting, dizziness, syncope, edema, weight gain, or early satiety.   Review of systems complete and found to be negative unless listed in HPI.   EP Information / Studies Reviewed:    EKG is not ordered today. EKG from 02/24/24 reviewed which showed sinus rhythm, right bundle branch block     Risk Assessment/Calculations:    CHA2DS2-VASc Score = 5   This indicates a 7.2% annual risk of stroke. The patient's score is based upon: CHF History: 0 HTN History: 1 Diabetes History: 0 Stroke History: 0 Vascular Disease History: 1 Age Score: 2 Gender Score: 1           Physical Exam:   VS:  There were no vitals taken for this visit.   Wt Readings from Last 3 Encounters:  02/24/24 134 lb 12.8 oz (61.1 kg)  01/27/24 134 lb 9.6 oz (61.1 kg)  01/09/24 137 lb (62.1 kg)     GEN: Well nourished, well developed in no acute distress NECK: No JVD; No carotid bruits CARDIAC: Regular rate and rhythm, no murmurs, rubs, gallops RESPIRATORY:  Clear to auscultation without rales, wheezing or rhonchi  ABDOMEN: Soft, non-tender, non-distended EXTREMITIES:  No edema; No  deformity   ASSESSMENT AND PLAN:    1.  Persistent atrial fibrillation/flutter: She feels poorly in atrial fibrillation.  She would like a rhythm control strategy.  She would like to avoid long-term antiarrhythmics as she does not feel well on multiple medications.  She is almost 69, though is quite active without multiple medical problems.  I think that she would be a good candidate for ablation.  Risk, benefits, and alternatives to EP study and radiofrequency/pulse field ablation for afib were also discussed in detail today. These risks include but are not limited to stroke, bleeding, vascular damage, tamponade, perforation, damage to the esophagus, lungs, and other structures, pulmonary vein stenosis, worsening renal function, and death. The patient understands these risk and wishes to proceed.  We Gershon Shorten therefore proceed with catheter ablation at the next available time.  Carto, ICE, anesthesia are requested for the procedure.  Jamarious Febo also obtain CT PV protocol prior to the procedure to exclude LAA thrombus and further evaluate atrial anatomy.  2.  Secondary hypercoagulable state: On Eliquis  for atrial fibrillation  3.  Coronary artery disease: No current chest pain.  Plan for primary cardiology  4.  Hypertension: Mildly elevated today but well-controlled.  Follow up with Afib Clinic as usual post procedure  Signed, Yamir Carignan Gladis Norton, MD

## 2024-03-21 ENCOUNTER — Other Ambulatory Visit (HOSPITAL_BASED_OUTPATIENT_CLINIC_OR_DEPARTMENT_OTHER): Payer: Self-pay

## 2024-03-21 MED ORDER — ERYTHROMYCIN 5 MG/GM OP OINT
TOPICAL_OINTMENT | OPHTHALMIC | 6 refills | Status: AC
  Filled 2024-03-21: qty 3.5, 7d supply, fill #0

## 2024-04-04 ENCOUNTER — Other Ambulatory Visit (HOSPITAL_BASED_OUTPATIENT_CLINIC_OR_DEPARTMENT_OTHER): Payer: Self-pay

## 2024-04-04 MED ORDER — DOXYCYCLINE HYCLATE 100 MG PO CAPS
100.0000 mg | ORAL_CAPSULE | Freq: Two times a day (BID) | ORAL | 0 refills | Status: DC
  Filled 2024-04-04: qty 10, 5d supply, fill #0

## 2024-04-05 ENCOUNTER — Other Ambulatory Visit (HOSPITAL_BASED_OUTPATIENT_CLINIC_OR_DEPARTMENT_OTHER): Payer: Self-pay

## 2024-04-05 ENCOUNTER — Other Ambulatory Visit: Payer: Self-pay | Admitting: Nurse Practitioner

## 2024-04-05 MED ORDER — ROSUVASTATIN CALCIUM 5 MG PO TABS
ORAL_TABLET | ORAL | 3 refills | Status: DC
  Filled 2024-04-05: qty 75, 84d supply, fill #0

## 2024-04-11 ENCOUNTER — Other Ambulatory Visit: Payer: Self-pay | Admitting: Cardiology

## 2024-04-11 ENCOUNTER — Other Ambulatory Visit (HOSPITAL_BASED_OUTPATIENT_CLINIC_OR_DEPARTMENT_OTHER): Payer: Self-pay

## 2024-04-11 DIAGNOSIS — I480 Paroxysmal atrial fibrillation: Secondary | ICD-10-CM

## 2024-04-11 MED ORDER — APIXABAN 5 MG PO TABS
5.0000 mg | ORAL_TABLET | Freq: Two times a day (BID) | ORAL | 1 refills | Status: DC
  Filled 2024-04-11: qty 180, 90d supply, fill #0
  Filled 2024-07-10: qty 180, 90d supply, fill #1

## 2024-04-11 NOTE — Telephone Encounter (Signed)
 Prescription refill request for Eliquis  received. Indication:afib Last office visit:6/25 Scr:0.94  6/25 Age: 83 Weight:61.5  kg  Prescription refilled

## 2024-04-16 ENCOUNTER — Telehealth: Payer: Self-pay | Admitting: Cardiology

## 2024-04-16 NOTE — Telephone Encounter (Signed)
 Spoke with the patient and advised that we are still waiting for the schedule to come out and we will call her with a date as soon as we can. Patient verbalized understanding.

## 2024-04-16 NOTE — Telephone Encounter (Signed)
 Pt called in to f/u on getting ablation scheduled. Please advise.

## 2024-04-25 ENCOUNTER — Telehealth: Payer: Self-pay

## 2024-04-25 DIAGNOSIS — I4819 Other persistent atrial fibrillation: Secondary | ICD-10-CM

## 2024-04-25 NOTE — Telephone Encounter (Signed)
 Patient has been scheduled for an ablation with Dr. Inocencio on 9/30. She will have labs drawn the week of 9/15. Instructions will be sent through MyChart. Patient is aware to call us  back with any questions.

## 2024-05-09 ENCOUNTER — Other Ambulatory Visit: Payer: Self-pay | Admitting: Cardiology

## 2024-05-09 ENCOUNTER — Other Ambulatory Visit (HOSPITAL_BASED_OUTPATIENT_CLINIC_OR_DEPARTMENT_OTHER): Payer: Self-pay

## 2024-05-09 ENCOUNTER — Other Ambulatory Visit: Payer: Self-pay | Admitting: Gastroenterology

## 2024-05-09 ENCOUNTER — Other Ambulatory Visit (HOSPITAL_COMMUNITY): Payer: Self-pay | Admitting: Physician Assistant

## 2024-05-09 MED ORDER — PANTOPRAZOLE SODIUM 40 MG PO TBEC
40.0000 mg | DELAYED_RELEASE_TABLET | Freq: Every day | ORAL | 3 refills | Status: AC
  Filled 2024-05-09: qty 90, 90d supply, fill #0
  Filled 2024-08-02: qty 90, 90d supply, fill #1

## 2024-05-10 ENCOUNTER — Other Ambulatory Visit (HOSPITAL_BASED_OUTPATIENT_CLINIC_OR_DEPARTMENT_OTHER): Payer: Self-pay

## 2024-05-10 MED ORDER — METOPROLOL TARTRATE 25 MG PO TABS
25.0000 mg | ORAL_TABLET | Freq: Four times a day (QID) | ORAL | 1 refills | Status: AC | PRN
  Filled 2024-05-10: qty 45, 12d supply, fill #0

## 2024-05-11 ENCOUNTER — Other Ambulatory Visit (HOSPITAL_BASED_OUTPATIENT_CLINIC_OR_DEPARTMENT_OTHER): Payer: Self-pay

## 2024-05-11 MED ORDER — METOPROLOL SUCCINATE ER 25 MG PO TB24
12.5000 mg | ORAL_TABLET | Freq: Three times a day (TID) | ORAL | 3 refills | Status: DC
  Filled 2024-05-11: qty 135, 90d supply, fill #0

## 2024-05-22 ENCOUNTER — Telehealth (HOSPITAL_COMMUNITY): Payer: Self-pay

## 2024-05-22 NOTE — Telephone Encounter (Signed)
 Spoke with patient to complete pre-procedure call.     Health status review:  Any new medical conditions, recent signs of acute illness or been started on antibiotics? No Any recent hospitalizations or surgeries? No Any new medications started since pre-op visit? No  Follow all medication instructions prior to procedure or the procedure may be rescheduled:    Continue taking Eliquis  (Apixaban ) twice daily without missing any doses before procedure. Essential chronic medications:  No medication should be continued, unless told otherwise. On the morning of your procedure DO NOT take any medication., including Eliquis  (Apixaban ).  Nothing to eat or drink after midnight prior to your procedure.  Pre-procedure testing scheduled: lab work on September 15.  Confirmed patient is scheduled for Atrial Fibrillation Ablation on Tuesday, September 30 with Dr. Soyla Norton. Instructed patient to arrive at the Main Entrance A at [facility]: [address] [place], [place] [address] and check in at Admitting at 10:30 AM.  Advised of plan to go home the same day and will only stay overnight if medically necessary. You MUST have a responsible adult to drive you home and MUST be with you the first 24 hours after you arrive home or your procedure could be cancelled.  Informed patient a nurse will call a day before the procedure to confirm arrival time and ensure instructions are followed.  Patient verbalized understanding to information provided and is agreeable to proceed with procedure.   Advised patient to contact RN Navigator at [phone], to inform of any new medications started after call or concerns prior to procedure.

## 2024-05-28 ENCOUNTER — Other Ambulatory Visit (HOSPITAL_BASED_OUTPATIENT_CLINIC_OR_DEPARTMENT_OTHER): Payer: Self-pay

## 2024-05-28 MED FILL — Clopidogrel Bisulfate Tab 75 MG (Base Equiv): ORAL | 90 days supply | Qty: 90 | Fill #2 | Status: AC

## 2024-06-04 LAB — CBC
Hematocrit: 42.3 % (ref 34.0–46.6)
Hemoglobin: 14.1 g/dL (ref 11.1–15.9)
MCH: 34.4 pg — ABNORMAL HIGH (ref 26.6–33.0)
MCHC: 33.3 g/dL (ref 31.5–35.7)
MCV: 103 fL — ABNORMAL HIGH (ref 79–97)
Platelets: 203 x10E3/uL (ref 150–450)
RBC: 4.1 x10E6/uL (ref 3.77–5.28)
RDW: 12.3 % (ref 11.7–15.4)
WBC: 5.7 x10E3/uL (ref 3.4–10.8)

## 2024-06-04 LAB — BASIC METABOLIC PANEL WITH GFR
BUN/Creatinine Ratio: 13 (ref 12–28)
BUN: 13 mg/dL (ref 8–27)
CO2: 22 mmol/L (ref 20–29)
Calcium: 10.1 mg/dL (ref 8.7–10.3)
Chloride: 104 mmol/L (ref 96–106)
Creatinine, Ser: 1.04 mg/dL — ABNORMAL HIGH (ref 0.57–1.00)
Glucose: 83 mg/dL (ref 70–99)
Potassium: 4.2 mmol/L (ref 3.5–5.2)
Sodium: 142 mmol/L (ref 134–144)
eGFR: 53 mL/min/1.73 — ABNORMAL LOW (ref >59)

## 2024-06-09 ENCOUNTER — Ambulatory Visit (HOSPITAL_BASED_OUTPATIENT_CLINIC_OR_DEPARTMENT_OTHER): Admission: EM | Admit: 2024-06-09 | Discharge: 2024-06-09 | Disposition: A | Discharge status: Home / Self Care

## 2024-06-09 ENCOUNTER — Encounter (HOSPITAL_BASED_OUTPATIENT_CLINIC_OR_DEPARTMENT_OTHER): Payer: Self-pay

## 2024-06-09 DIAGNOSIS — H5712 Ocular pain, left eye: Secondary | ICD-10-CM

## 2024-06-09 DIAGNOSIS — H1132 Conjunctival hemorrhage, left eye: Secondary | ICD-10-CM | POA: Diagnosis not present

## 2024-06-09 NOTE — ED Triage Notes (Signed)
 Right eye  Went to the eye doc in May, got a biopsy This week right eye has Redness, taking blood thinner, itchiness, feels like it is pulling, eye pain Denies blurry vision

## 2024-06-09 NOTE — Discharge Instructions (Addendum)
 Left eye pain and left subconjunctival hemorrhage: Encouraged ice packs 30 minutes on and 30 minutes off and repeat as often as tolerated for the next 2 days.  Then use warm compresses, 30 minutes on and 30 minutes off to help this reabsorb and go away.  May use acetaminophen  if needed for pain.  Follow-up with optometrist or ophthalmology if needed.  Contact a health care provider if: You have pain in your eye. The bleeding doesn't go away within 4 weeks. You keep getting new subconjunctival hemorrhages. Get help right away if: Your vision changes or you have double vision. You're suddenly sensitive to light. You have a very bad headache or keep throwing up. You're confused or more tired than normal. Your eye seems swollen or sticks out. You have unexplained bruises or bleeding in another area of your body. These symptoms may be an emergency. Call 911 right away. Do not wait to see if the symptoms will go away. Do not drive yourself to the hospital.

## 2024-06-09 NOTE — ED Provider Notes (Signed)
 Sarah Ray CARE    CSN: [id number] Arrival date & time: 06/09/24  1600      History   Chief Complaint Chief Complaint  Patient presents with   Eye Problem    HPI Sarah Ray is a 83 y.o. female.   83 year old female who reports blood in the white of her left eye that she noticed on approximately 06/06/2024 or earlier.  Initially it was just a red area but it has become clear that it was bleeding in the layers of the eye today.  She is on 2 separate blood thinners and bleeding in her eye scares her.  She feels some pressure or pain in the eye.  No change in vision.  She is not aware of trauma or injury.  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  She is supposed to massage that area regularly and she does not know if perhaps she got too vigorous with working on that lower lid and poked her eye or something.     Past Medical History:  Diagnosis Date   Adenomatous polyps 07/1997   Breast fibroadenoma    CAD (coronary artery disease) 03/18/2014   STEMI- stent to RCA and nonobstructive disease LAD and LCX   External hemorrhoids    GERD (gastroesophageal reflux disease)    LA Class Grade C Erosive esophagitis   Heart attack (HCC) 2015   Hiatal hernia    HTN (hypertension)    Hyperlipidemia    Internal hemorrhoids    Osteopenia 04/2018   T score -1.9 FRAX 21% / 5%   Peptic stricture of esophagus    Skin cancer 2014   forehead   ST elevation myocardial infarction (STEMI) involving right coronary artery in recovery phase (HCC) 03/18/2014    Patient Active Problem List   Diagnosis Date Noted   Typical atrial flutter (HCC) 11/01/2023   Paroxysmal atrial fibrillation (HCC) 12/31/2019   Hypercoagulable state due to paroxysmal atrial fibrillation (HCC) 12/31/2019   Dyslipidemia, goal LDL below 70 05/23/2018   Chest pain, unspecified 05/04/2018    Syncope and collapse    Multiple thyroid  nodules 12/10/2015   Lung nodule < 6cm on CT 06/11/2015   Chest pain with moderate risk for cardiac etiology 02/11/2015   HTN (hypertension)    Chest pain 04/22/2014   STEMI (ST elevation myocardial infarction) (HCC) 03/18/2014   UTI (urinary tract infection) 03/18/2014   History of allergy to aspirin - Hives, edema 03/18/2014   Acute MI, inferolateral wall, initial episode of care (HCC) 03/18/2014   Inferolateral myocardial infarction (HCC) 03/18/2014   CAD S/P percutaneous coronary angioplasty 03/18/2014   Osteopenia    Breast fibroadenoma    Hx of adenomatous colonic polyps 11/15/2007   Unspecified disorder of thyroid  11/15/2007   HYPERCHOLESTEROLEMIA 11/15/2007   INTERNAL HEMORRHOIDS 11/15/2007   HEMORRHOIDS, EXTERNAL 11/15/2007   GERD 11/15/2007   PEPTIC STRICTURE 11/15/2007   HIATAL HERNIA 11/15/2007    Past Surgical History:  Procedure Laterality Date   ABDOMINAL HYSTERECTOMY  1977   Leiomyoma   BREAST CYST ASPIRATION Right ~ 2011   BUNIONECTOMY Bilateral 04/04/2013   CARDIAC CATHETERIZATION N/A 09/29/2016   Procedure: Left Heart Cath and Coronary Angiography;  Surgeon: Victory LELON Sharps, MD;  Location: [facility] INVASIVE CV LAB;  Service: Cardiovascular;  Laterality: N/A;   CORONARY ANGIOPLASTY WITH STENT  PLACEMENT  03/18/14   Xience DES to totally occl RCA, residual non obstructive disease to LAD and LCX   ESOPHAGOGASTRODUODENOSCOPY (EGD) WITH ESOPHAGEAL DILATION  2000's X 1   LEFT HEART CATH N/A 03/18/2014   Procedure: LEFT HEART CATH;  Surgeon: Ozell JONETTA Fell, MD;  Location: [facility] CATH LAB;  Service: Cardiovascular;  Laterality: N/A;   LEFT HEART CATHETERIZATION WITH CORONARY ANGIOGRAM N/A 04/22/2014   Procedure: LEFT HEART CATHETERIZATION WITH CORONARY ANGIOGRAM;  Surgeon: Ozell JONETTA Fell, MD;  Location: [facility] CATH LAB;  Service: Cardiovascular;  Laterality: N/A;   SKIN CANCER EXCISION  2014   THYROID  CYST EXCISION  1970's   TONSILLECTOMY AND  ADENOIDECTOMY  ~ 1950    OB History     Gravida  2   Para  2   Term  2   Preterm      AB      Living  2      SAB      IAB      Ectopic      Multiple      Live Births               Home Medications    Prior to Admission medications   Medication Sig Start Date End Date Taking? Authorizing Provider  apixaban  (ELIQUIS ) 5 MG TABS tablet Take 1 tablet (5 mg total) by mouth 2 (two) times daily. 04/11/24   Camnitz, Soyla Lunger, MD  b complex vitamins capsule Take 1 capsule by mouth daily.    [provider]  BIOTIN PO Take 1 tablet by mouth daily.    [provider]  CALCIUM -VITAMIN D PO Take 1 tablet by mouth 2 (two) times daily.     [provider]  clopidogrel  (PLAVIX ) 75 MG tablet Take 1 tablet (75 mg total) by mouth at bedtime. 08/24/23   Nishan, Peter C, MD  doxycycline  (VIBRAMYCIN ) 100 MG capsule Take 1 capsule (100 mg total) by mouth 2 (two) times daily with a meal. 04/04/24     erythromycin  ophthalmic ointment Apply inch strip to right eye at night 03/21/24     estradiol  (ESTRACE ) 0.1 MG/GM vaginal cream PLACE 1 GRAM VAGINALLY 3 TIMES A WEEK 08/16/23   Chrzanowski, Jami B, NP  famotidine  (PEPCID ) 20 MG tablet Take 1 tablet (20 mg total) by mouth 2 (two) times daily. 08/21/23     metoprolol  succinate (TOPROL -XL) 25 MG 24 hr tablet Take 1 tablet (25 mg total) by mouth 2 (two) times daily. 02/24/24   Fenton, Clint R, PA  metoprolol  succinate (TOPROL -XL) 25 MG 24 hr tablet Take 0.5 tablets (12.5 mg total) by mouth in the morning, at noon, and at bedtime. 05/11/24   Camnitz, Soyla Lunger, MD  metoprolol  tartrate (LOPRESSOR ) 25 MG tablet Take 1 tablet (25 mg total) by mouth every 6 (six) hours as needed for afib hr over 100. 05/10/24   Fenton, Clint R, PA  nitroGLYCERIN  (NITROSTAT ) 0.4 MG SL tablet Place 1 tablet (0.4 mg total) under the tongue every 5 (five) minutes as needed for chest pain. 07/19/18   Delford Maude BROCKS, MD  pantoprazole  (PROTONIX ) 40 MG  tablet TAKE 1 TABLET(40 MG) BY MOUTH DAILY 01/23/24   Cirigliano, Vito V, DO  pantoprazole  (PROTONIX ) 40 MG tablet Take 1 tablet (40 mg total) by mouth daily. 05/09/24   Cirigliano, Vito V, DO  Pramoxine-Menthol-Dimethicone (GOLD BOND MEDICATED ANTI ITCH EX) Apply 1 Application topically daily as needed (itching).    [provider]  rosuvastatin  (CRESTOR ) 5 MG tablet TAKE 1 TABLET BY MOUTH DAILY 5 DAYS A WEEK TO DAILY AS TOLERATED 04/05/24   Delford Maude BROCKS, MD    Family History Family History  Problem Relation Age of Onset   Colon polyps Mother    Heart disease Brother        irregular heart beat   Colon cancer Maternal Aunt    Breast cancer Maternal Aunt        Age 10   Asthma Paternal Grandmother    Breast cancer Daughter        Age 68   Esophageal cancer Neg Hx     Social History Social History   Tobacco Use   Smoking status: Never    Passive exposure: Never   Smokeless tobacco: Never   Tobacco comments:    Never smoke 04/09/22  Vaping Use   Vaping status: Never Used  Substance Use Topics   Alcohol use: No    Alcohol/week: 0.0 standard drinks of alcohol   Drug use: No     Allergies   Naprosyn [naproxen], Ticagrelor , Aspirin , Atorvastatin , Other, Bempedoic acid , Covid-19 (mrna) vaccine, Gabapentin , Milk-related compounds, and Praluent  [alirocumab ]   Review of Systems Review of Systems  Constitutional:  Negative for chills and fever.  HENT:  Negative for ear pain and sore throat.   Eyes:  Positive for pain and redness. Negative for visual disturbance.  Respiratory:  Negative for cough and shortness of breath.   Cardiovascular:  Negative for chest pain and palpitations.  Gastrointestinal:  Negative for abdominal pain, constipation, diarrhea, nausea and vomiting.  Genitourinary:  Negative for dysuria and hematuria.  Musculoskeletal:  Negative for arthralgias and back pain.  Skin:  Negative for color change and rash.  Neurological:  Negative for seizures and  syncope.  All other systems reviewed and are negative.    Physical Exam Triage Vital Signs ED Triage Vitals  Encounter Vitals Group     BP 06/09/24 1641 (!) (P) 158/79     Girls Systolic BP Percentile --      Girls Diastolic BP Percentile --      Boys Systolic BP Percentile --      Boys Diastolic BP Percentile --      Pulse Rate 06/09/24 1641 (P) 80     Resp 06/09/24 1641 (P) 16     Temp 06/09/24 1641 (!) (P) 97.4 F (36.3 C)     Temp Source 06/09/24 1641 (P) Oral     SpO2 06/09/24 1641 (P) 95 %     Weight --      Height --      Head Circumference --      Peak Flow --      Pain Score 06/09/24 1638 6     Pain Loc --      Pain Education --      Exclude from Growth Chart --    No data found.  Updated Vital Signs BP (!) (P) 158/79 (BP Location: Right Arm)   Pulse (P) 80   Temp (!) (P) 97.4 F (36.3 C) (Oral)   Resp (P) 16   SpO2 (P) 95%   Visual Acuity Right Eye Distance:   Left Eye Distance:   Bilateral Distance:    Right Eye Near:   Left Eye Near:    Bilateral Near:     Physical Exam Vitals and nursing note reviewed.  Constitutional:      General: She is not in acute distress.  Appearance: She is well-developed. She is not ill-appearing or toxic-appearing.  HENT:     Head: Normocephalic and atraumatic.     Right Ear: Hearing, tympanic membrane, ear canal and external ear normal.     Left Ear: Hearing, tympanic membrane, ear canal and external ear normal.     Nose: No congestion or rhinorrhea.     Right Sinus: No maxillary sinus tenderness or frontal sinus tenderness.     Left Sinus: No maxillary sinus tenderness or frontal sinus tenderness.     Mouth/Throat:     Lips: Pink.     Mouth: Mucous membranes are moist.     Pharynx: Uvula midline. No oropharyngeal exudate or posterior oropharyngeal erythema.     Tonsils: No tonsillar exudate.  Eyes:     General: No allergic shiner, visual field deficit or scleral icterus.       Right eye: No foreign body,  discharge or hordeolum.        Left eye: No foreign body, discharge or hordeolum.     Extraocular Movements:     Right eye: Normal extraocular motion and no nystagmus.     Left eye: Normal extraocular motion and no nystagmus.     Conjunctiva/sclera:     Right eye: Right conjunctiva is not injected. No chemosis, exudate or hemorrhage.    Left eye: Left conjunctiva is not injected. Hemorrhage present. No chemosis or exudate.    Pupils: Pupils are equal, round, and reactive to light.     Comments: Right lower eyelid droops just a little bit medially.  Left lateral conjunctiva with a subconjunctival hemorrhage that is mild to moderate and does not even cover all of the left conjunctival area.  Cardiovascular:     Rate and Rhythm: Normal rate and regular rhythm.     Heart sounds: S1 normal and S2 normal. No murmur heard. Pulmonary:     Effort: Pulmonary effort is normal. No respiratory distress.     Breath sounds: Normal breath sounds. No decreased breath sounds, wheezing, rhonchi or rales.  Abdominal:     General: Bowel sounds are normal.     Palpations: Abdomen is soft.     Tenderness: There is no abdominal tenderness.  Musculoskeletal:        General: No swelling.     Cervical back: Neck supple.  Lymphadenopathy:     Head:     Right side of head: No submental, submandibular, tonsillar, preauricular or posterior auricular adenopathy.     Left side of head: No submental, submandibular, tonsillar, preauricular or posterior auricular adenopathy.     Cervical: No cervical adenopathy.     Right cervical: No superficial cervical adenopathy.    Left cervical: No superficial cervical adenopathy.  Skin:    General: Skin is warm and dry.     Capillary Refill: Capillary refill takes less than 2 seconds.     Findings: No rash.  Neurological:     Mental Status: She is alert and oriented to person, place, and time.  Psychiatric:        Mood and Affect: Mood normal.      UC Treatments /  Results  Labs (all labs ordered are listed, but only abnormal results are displayed) Labs Reviewed - No data to display  EKG   Radiology No results found.  Procedures Procedures (including critical care time)  Medications Ordered in UC Medications - No data to display  Initial Impression / Assessment and Plan / UC Course  I have reviewed  the triage vital signs and the nursing notes.  Pertinent labs & imaging results that were available during my care of the patient were reviewed by me and considered in my medical decision making (see chart for details).  Plan of Care: Left subconjunctival hemorrhage laterally and left eye pain: Educated patient about this condition with handouts provided.  Encouraged ice packs, 30 minutes on and 30 minutes off and repeat as often as possible for 48 hours.  Then switch to warm compresses, 30 minutes on and 30 minutes off for the next week or two.  Follow-up with ophthalmology or optometry if this does not completely resolve on its own.  Provided information on signs of worsening condition and reasons to go to an emergency room.  I reviewed the plan of care with the patient and/or the patient's guardian.  The patient and/or guardian had time to ask questions and acknowledged that the questions were answered.  I provided instruction on symptoms or reasons to return here or to go to an ER, if symptoms/condition did not improve, worsened or if new symptoms occurred.  Final Clinical Impressions(s) / UC Diagnoses   Final diagnoses:  Acute left eye pain  Subconjunctival hemorrhage of left eye     Discharge Instructions      Left eye pain and left subconjunctival hemorrhage: Encouraged ice packs 30 minutes on and 30 minutes off and repeat as often as tolerated for the next 2 days.  Then use warm compresses, 30 minutes on and 30 minutes off to help this reabsorb and go away.  May use acetaminophen  if needed for pain.  Follow-up with optometrist or  ophthalmology if needed.  Contact a health care provider if: You have pain in your eye. The bleeding doesn't go away within 4 weeks. You keep getting new subconjunctival hemorrhages. Get help right away if: Your vision changes or you have double vision. You're suddenly sensitive to light. You have a very bad headache or keep throwing up. You're confused or more tired than normal. Your eye seems swollen or sticks out. You have unexplained bruises or bleeding in another area of your body. These symptoms may be an emergency. Call 911 right away. Do not wait to see if the symptoms will go away. Do not drive yourself to the hospital.    ED Prescriptions   None    PDMP not reviewed this encounter.   Ival Domino, FNP 06/09/24 [phone]

## 2024-06-18 NOTE — Pre-Procedure Instructions (Addendum)
 Instructed patient on the following items: Arrival time 1030 Nothing to eat or drink after midnight No meds AM of procedure Responsible person to drive you home and stay with you for 24 hrs  Have you missed any doses of anti-coagulant Eliquis - takes twice a day, hasn't missed any doses in last 2 weeks.  Don't take day of procedure.  She states she may have missed a dose before the 2 weeks.  Will notify Dr Inocencio and do EKG on arrival tomorrow.

## 2024-06-19 ENCOUNTER — Ambulatory Visit (HOSPITAL_COMMUNITY): Admitting: Certified Registered"

## 2024-06-19 ENCOUNTER — Ambulatory Visit (HOSPITAL_COMMUNITY)
Admission: RE | Disposition: A | Discharge status: Home / Self Care | Payer: Self-pay | Source: Home / Self Care | Attending: Cardiology

## 2024-06-19 ENCOUNTER — Ambulatory Visit (HOSPITAL_BASED_OUTPATIENT_CLINIC_OR_DEPARTMENT_OTHER): Admitting: Certified Registered"

## 2024-06-19 ENCOUNTER — Observation Stay (HOSPITAL_COMMUNITY)
Admission: RE | Admit: 2024-06-19 | Discharge: 2024-06-20 | Disposition: A | Discharge status: Home / Self Care | Attending: Cardiology | Admitting: Cardiology

## 2024-06-19 ENCOUNTER — Other Ambulatory Visit: Payer: Self-pay

## 2024-06-19 ENCOUNTER — Telehealth: Payer: Self-pay | Admitting: Cardiology

## 2024-06-19 ENCOUNTER — Encounter (HOSPITAL_COMMUNITY): Payer: Self-pay | Admitting: Cardiology

## 2024-06-19 DIAGNOSIS — I2510 Atherosclerotic heart disease of native coronary artery without angina pectoris: Secondary | ICD-10-CM

## 2024-06-19 DIAGNOSIS — I4891 Unspecified atrial fibrillation: Secondary | ICD-10-CM | POA: Diagnosis not present

## 2024-06-19 DIAGNOSIS — D6869 Other thrombophilia: Principal | ICD-10-CM

## 2024-06-19 DIAGNOSIS — Z7901 Long term (current) use of anticoagulants: Secondary | ICD-10-CM | POA: Insufficient documentation

## 2024-06-19 DIAGNOSIS — I4510 Unspecified right bundle-branch block: Secondary | ICD-10-CM | POA: Insufficient documentation

## 2024-06-19 DIAGNOSIS — I483 Typical atrial flutter: Secondary | ICD-10-CM | POA: Diagnosis not present

## 2024-06-19 DIAGNOSIS — I4819 Other persistent atrial fibrillation: Principal | ICD-10-CM | POA: Insufficient documentation

## 2024-06-19 DIAGNOSIS — Z79899 Other long term (current) drug therapy: Secondary | ICD-10-CM | POA: Insufficient documentation

## 2024-06-19 DIAGNOSIS — I10 Essential (primary) hypertension: Secondary | ICD-10-CM | POA: Insufficient documentation

## 2024-06-19 LAB — GLUCOSE, CAPILLARY: Glucose-Capillary: 122 mg/dL — ABNORMAL HIGH (ref 70–99)

## 2024-06-19 SURGERY — ATRIAL FIBRILLATION ABLATION
Anesthesia: General

## 2024-06-19 MED ORDER — CLOPIDOGREL BISULFATE 75 MG PO TABS: 75.0000 mg | ORAL_TABLET | Freq: Every day | ORAL | Status: DC

## 2024-06-19 MED ORDER — ROSUVASTATIN CALCIUM 5 MG PO TABS: 5.0000 mg | ORAL_TABLET | Freq: Every day | ORAL | Status: DC

## 2024-06-19 MED ORDER — SODIUM CHLORIDE 0.9 % IV SOLN: 250.0000 mL | INTRAVENOUS | Status: DC | PRN

## 2024-06-19 MED ORDER — PANTOPRAZOLE SODIUM 40 MG PO TBEC: 40.0000 mg | DELAYED_RELEASE_TABLET | Freq: Every day | ORAL | Status: DC

## 2024-06-19 MED ORDER — HEPARIN SODIUM (PORCINE) 1000 UNIT/ML IJ SOLN
INTRAMUSCULAR | Status: DC | PRN
Start: 2024-06-19 — End: 2024-06-19
  Administered 2024-06-19: 13000 [IU] via INTRAVENOUS

## 2024-06-19 MED ORDER — FAMOTIDINE 20 MG PO TABS
20.0000 mg | ORAL_TABLET | Freq: Every day | ORAL | Status: DC
  Administered 2024-06-19: 20 mg via ORAL
  Filled 2024-06-19: qty 1

## 2024-06-19 MED ORDER — FENTANYL CITRATE (PF) 100 MCG/2ML IJ SOLN
INTRAMUSCULAR | Status: AC
  Filled 2024-06-19: qty 2

## 2024-06-19 MED ORDER — SUGAMMADEX SODIUM 200 MG/2ML IV SOLN
INTRAVENOUS | Status: DC | PRN
  Administered 2024-06-19: 240 mg via INTRAVENOUS

## 2024-06-19 MED ORDER — FENTANYL CITRATE (PF) 250 MCG/5ML IJ SOLN
INTRAMUSCULAR | Status: DC | PRN
  Administered 2024-06-19: 100 ug via INTRAVENOUS

## 2024-06-19 MED ORDER — ATROPINE SULFATE 1 MG/10ML IJ SOSY
PREFILLED_SYRINGE | INTRAMUSCULAR | Status: DC | PRN
  Administered 2024-06-19: 1 mg via INTRAVENOUS

## 2024-06-19 MED ORDER — FENTANYL CITRATE (PF) 100 MCG/2ML IJ SOLN: 25.0000 ug | INTRAMUSCULAR | Status: DC | PRN

## 2024-06-19 MED ORDER — PHENYLEPHRINE HCL-NACL 20-0.9 MG/250ML-% IV SOLN
INTRAVENOUS | Status: DC | PRN
  Administered 2024-06-19: 25 ug/min via INTRAVENOUS

## 2024-06-19 MED ORDER — SODIUM CHLORIDE 0.9 % IV SOLN: INTRAVENOUS | Status: DC

## 2024-06-19 MED ORDER — METOPROLOL TARTRATE 25 MG PO TABS: 25.0000 mg | ORAL_TABLET | Freq: Four times a day (QID) | ORAL | Status: DC | PRN

## 2024-06-19 MED ORDER — PROPOFOL 10 MG/ML IV BOLUS
INTRAVENOUS | Status: DC | PRN
  Administered 2024-06-19: 80 mg via INTRAVENOUS

## 2024-06-19 MED ORDER — HEPARIN (PORCINE) IN NACL 1000-0.9 UT/500ML-% IV SOLN
INTRAVENOUS | Status: DC | PRN
  Administered 2024-06-19 (×3): 500 mL

## 2024-06-19 MED ORDER — PROTAMINE SULFATE 10 MG/ML IV SOLN
INTRAVENOUS | Status: DC | PRN
  Administered 2024-06-19: 20 mg via INTRAVENOUS
  Administered 2024-06-19 (×2): 10 mg via INTRAVENOUS

## 2024-06-19 MED ORDER — SODIUM CHLORIDE 0.9% FLUSH
3.0000 mL | Freq: Two times a day (BID) | INTRAVENOUS | Status: DC
  Administered 2024-06-19: 3 mL via INTRAVENOUS

## 2024-06-19 MED ORDER — APIXABAN 5 MG PO TABS
5.0000 mg | ORAL_TABLET | Freq: Two times a day (BID) | ORAL | Status: DC
  Administered 2024-06-19: 5 mg via ORAL
  Filled 2024-06-19: qty 1

## 2024-06-19 MED ORDER — ROCURONIUM BROMIDE 10 MG/ML (PF) SYRINGE
PREFILLED_SYRINGE | INTRAVENOUS | Status: DC | PRN
  Administered 2024-06-19: 10 mg via INTRAVENOUS
  Administered 2024-06-19: 40 mg via INTRAVENOUS

## 2024-06-19 MED ORDER — LIDOCAINE 2% (20 MG/ML) 5 ML SYRINGE
INTRAMUSCULAR | Status: DC | PRN
  Administered 2024-06-19: 60 mg via INTRAVENOUS

## 2024-06-19 MED ORDER — SODIUM CHLORIDE 0.9% FLUSH: 3.0000 mL | INTRAVENOUS | Status: DC | PRN

## 2024-06-19 MED ORDER — ACETAMINOPHEN 325 MG PO TABS: 650.0000 mg | ORAL_TABLET | ORAL | Status: DC | PRN

## 2024-06-19 MED ORDER — ONDANSETRON HCL 4 MG/2ML IJ SOLN
INTRAMUSCULAR | Status: DC | PRN
  Administered 2024-06-19: 4 mg via INTRAVENOUS

## 2024-06-19 MED ORDER — ONDANSETRON HCL 4 MG/2ML IJ SOLN: 4.0000 mg | Freq: Four times a day (QID) | INTRAMUSCULAR | Status: DC | PRN

## 2024-06-19 MED ORDER — PROPOFOL 500 MG/50ML IV EMUL
INTRAVENOUS | Status: DC | PRN
  Administered 2024-06-19: 90 ug/kg/min via INTRAVENOUS

## 2024-06-19 SURGICAL SUPPLY — 19 items
CABLE FARASTAR GEN2 SNGL USE (CABLE) IMPLANT
CATH EZ STEER NAV 8MM D-F CUR (ABLATOR) IMPLANT
CATH FARAWAVE 2.0 31 (CATHETERS) IMPLANT
CATH GE 8FR SOUNDSTAR (CATHETERS) IMPLANT
CATH OCTARAY 2.0 F 3-3-3-3-3 (CATHETERS) IMPLANT
CATH WEBSTER BI DIR CS D-F CRV (CATHETERS) IMPLANT
CLOSURE MYNX CONTROL 5F (Vascular Products) IMPLANT
CLOSURE PERCLOSE PROSTYLE (Vascular Products) IMPLANT
COVER SWIFTLINK CONNECTOR (BAG) ×2 IMPLANT
DILATOR VESSEL 38 20CM 16FR (INTRODUCER) IMPLANT
GUIDEWIRE INQWIRE 1.5J.035X260 (WIRE) IMPLANT
KIT VERSACROSS CNCT FARADRIVE (KITS) IMPLANT
PACK EP LF (CUSTOM PROCEDURE TRAY) ×2 IMPLANT
PAD DEFIB RADIO PHYSIO CONN (PAD) ×2 IMPLANT
PATCH CARTO3 (PAD) IMPLANT
SHEATH FARADRIVE STEERABLE (SHEATH) IMPLANT
SHEATH PINNACLE 8F 10CM (SHEATH) IMPLANT
SHEATH PINNACLE 9F 10CM (SHEATH) IMPLANT
SHEATH PROBE COVER 6X72 (BAG) IMPLANT

## 2024-06-19 NOTE — Discharge Instructions (Signed)

## 2024-06-19 NOTE — Telephone Encounter (Signed)
 I called Daughter, she was not with pt but had the pt call me right back. I informed her that we WILL NOT cancel her procedure due to her missing last nights dose. I apologized for her being told that. I confirmed that the pt had not had anything to eat or drink yet and she had not other than enough water to take her Eliquis  and Plavix  which she was told to do by the on call female that she spoke to at 5:30 am this morning when she called to find out what she needed to do since she missed last nights dose of Eliquis .   She was very appreciative for the phone call and very thankful that she did not have to cancel.  She will be at the hospital at her original given time. They will do an EKG to make sure she is not in A-fib when she arrives.   I have called Delon Decent, RN and made her aware of this situation.

## 2024-06-19 NOTE — Telephone Encounter (Signed)
 Pt daughter called in with pt on the line. She states pt has an ablation this morning but she forgot to take eliquis  yesterday. She states she spoke to someone on call that told her to go ahead and take it this morning but she wants to make sure she can still have ablation today. Please advise.

## 2024-06-19 NOTE — H&P (Signed)
  Electrophysiology Office Note:   Date:  06/19/2024  ID:  Sarah Ray, DOB 07/19/1941, MRN [id number]  Primary Cardiologist: Maude Emmer, MD Primary Heart Failure: None Electrophysiologist: Naijah Lacek Gladis Norton, MD      History of Present Illness:   Sarah Ray is a 83 y.o. female with h/o coronary artery disease, hypertension, hyperlipidemia, atrial fibrillation seen today for routine electrophysiology followup.   Today, denies symptoms of palpitations, chest pain, dyspnea, orthopnea, PND, lower extremity edema, claudication, dizziness, presyncope, syncope, bleeding, or neurologic sequela. The patient is tolerating medications without difficulties. Plan ablation today.   EP Information / Studies Reviewed:    EKG is not ordered today. EKG from 02/24/24 reviewed which showed sinus rhythm, right bundle branch block     Risk Assessment/Calculations:    CHA2DS2-VASc Score =     This indicates a  % annual risk of stroke. The patient's score is based upon:             Physical Exam:   VS:  There were no vitals taken for this visit.   Wt Readings from Last 3 Encounters:  03/12/24 61.5 kg  02/24/24 61.1 kg  01/27/24 61.1 kg    GEN: Well nourished, well developed in no acute distress NECK: No JVD; No carotid bruits CARDIAC: Regular rate and rhythm, no murmurs, rubs, gallops RESPIRATORY:  Clear to auscultation without rales, wheezing or rhonchi  ABDOMEN: Soft, non-tender, non-distended EXTREMITIES:  No edema; No deformity    ASSESSMENT AND PLAN:    1.  Persistent atrial fibrillation/flutter: Sarah Ray has presented today for surgery, with the diagnosis of AF.  The various methods of treatment have been discussed with the patient and family. After consideration of risks, benefits and other options for treatment, the patient has consented to  Procedure(s): Catheter ablation as a surgical intervention .  Risks include but not limited to complete heart block, stroke, esophageal damage,  nerve damage, bleeding, vascular damage, tamponade, perforation, MI, and death. The patient's history has been reviewed, patient examined, no change in status, stable for surgery.  I have reviewed the patient's chart and labs.  Questions were answered to the patient's satisfaction.    Meggen Spaziani Norton, MD 06/19/2024 10:23 AM

## 2024-06-19 NOTE — Significant Event (Signed)
 Rapid Response Event Note   Reason for Call :  Difficulty speaking  Initial Focused Assessment:  NIHSS completed with no focal deficit, see flowsheet. Skin warm, dry, pink. No distress. Denies lightheadedness, dizziness, or headache. Irregular HR, s/p AF ablation.   VS: BP 142/83, HR 85, RR 11, SpO2 96% on room air CBG: 122  Plan of Care:  -Admit inpatient -EKG  Event Summary:  MD Notified: Dr. Inocencio Call Time: 1812 Arrival Time: 1815 End Time: 1830  Leonor LITTIE Danker, RN

## 2024-06-19 NOTE — Progress Notes (Signed)
 Report called to Red RN for 4-E-03 and transferred via bed with cardiac monitor

## 2024-06-19 NOTE — Transfer of Care (Signed)
 Immediate Anesthesia Transfer of Care Note  Patient: Sarah Ray  Procedure(s) Performed: ATRIAL FIBRILLATION ABLATION  Patient Location: PACU and Cath Lab  Anesthesia Type:General  Level of Consciousness: confused and responds to stimulation  Airway & Oxygen Therapy: Patient Spontanous Breathing and Patient connected to face mask oxygen  Post-op Assessment: Report given to RN, Post -op Vital signs reviewed and stable, and Patient moving all extremities X 4  Post vital signs: Reviewed and stable  Last Vitals:  Vitals Value Taken Time  BP 132/83   Temp    Pulse 88   Resp 17   SpO2 97     Last Pain:  Vitals:   06/19/24 1041  TempSrc: Oral  PainSc:          Complications: There were no known notable events for this encounter.

## 2024-06-19 NOTE — Anesthesia Procedure Notes (Signed)
 Procedure Name: Intubation Date/Time: 06/19/2024 1:41 PM  Performed by: Myrna Homer, CRNAPre-anesthesia Checklist: Patient identified, Emergency Drugs available, Suction available and Patient being monitored Patient Re-evaluated:Patient Re-evaluated prior to induction Oxygen Delivery Method: Circle System Utilized Preoxygenation: Pre-oxygenation with 100% oxygen Induction Type: IV induction Ventilation: Mask ventilation without difficulty Laryngoscope Size: Glidescope and 3 Grade View: Grade I Tube type: Oral Tube size: 7.0 mm Number of attempts: 1 Airway Equipment and Method: Stylet and Oral airway Placement Confirmation: ETT inserted through vocal cords under direct vision, positive ETCO2 and breath sounds checked- equal and bilateral Secured at: 21 cm Tube secured with: Tape Dental Injury: Teeth and Oropharynx as per pre-operative assessment

## 2024-06-19 NOTE — Anesthesia Postprocedure Evaluation (Signed)
 Anesthesia Post Note  Patient: Sarah Ray  Procedure(s) Performed: ATRIAL FIBRILLATION ABLATION     Patient location during evaluation: PACU Anesthesia Type: General Level of consciousness: awake and alert Pain management: pain level controlled Vital Signs Assessment: post-procedure vital signs reviewed and stable Respiratory status: spontaneous breathing, nonlabored ventilation and respiratory function stable Cardiovascular status: blood pressure returned to baseline Postop Assessment: no apparent nausea or vomiting Anesthetic complications: no   There were no known notable events for this encounter.  Last Vitals:  Vitals:   06/19/24 1540 06/19/24 1545  BP: 126/80 (!) 142/91  Pulse: 76 85  Resp: 17 (!) 23  Temp:  36.6 C  SpO2: 95% 94%    Last Pain:  Vitals:   06/19/24 1545  TempSrc: Temporal  PainSc: 0-No pain                 Vertell Row

## 2024-06-19 NOTE — Telephone Encounter (Signed)
 Patient called to inform office she did not take the pm dose of elqiuis.She is schedule for Ablation -she would need to be at the hospital at 10:30 am She did cal the on call person this morning- they informed her to take it at that time  @ 5:28 am this morning.   RN informed patient that procedure will have to be cancel due to her missing dose last evening. She is aware that scheduler wil call her to reschedule and call to cancel today's procedure

## 2024-06-19 NOTE — Progress Notes (Signed)
 Client awakened and having trouble speaking; speech garbled; PEARL; called rapid response nurse and code stroke nurse and they were in to see client; no other neurological changes

## 2024-06-19 NOTE — Anesthesia Preprocedure Evaluation (Addendum)
 Anesthesia Evaluation  Patient identified by MRN, date of birth, ID band Patient awake    Reviewed: Allergy & Precautions, NPO status , Patient's Chart, lab work & pertinent test results, reviewed documented beta blocker date and time   History of Anesthesia Complications Negative for: history of anesthetic complications  Airway Mallampati: II  TM Distance: >3 FB Neck ROM: Full    Dental  (+) Missing,    Pulmonary neg pulmonary ROS   Pulmonary exam normal        Cardiovascular hypertension, Pt. on home beta blockers and Pt. on medications + CAD, + Past MI and + Cardiac Stents (2015)  Normal cardiovascular exam+ dysrhythmias Atrial Fibrillation      Neuro/Psych negative neurological ROS     GI/Hepatic Neg liver ROS, hiatal hernia,GERD  Medicated,,  Endo/Other  negative endocrine ROS    Renal/GU negative Renal ROS     Musculoskeletal negative musculoskeletal ROS (+)    Abdominal   Peds  Hematology negative hematology ROS (+)   Anesthesia Other Findings   Reproductive/Obstetrics                              Anesthesia Physical Anesthesia Plan  ASA: 3  Anesthesia Plan: General   Post-op Pain Management: Minimal or no pain anticipated   Induction: Intravenous  PONV Risk Score and Plan: 3 and Treatment may vary due to age or medical condition, Ondansetron  and Dexamethasone  Airway Management Planned: Oral ETT  Additional Equipment: None  Intra-op Plan:   Post-operative Plan: Extubation in OR  Informed Consent: I have reviewed the patients History and Physical, chart, labs and discussed the procedure including the risks, benefits and alternatives for the proposed anesthesia with the patient or authorized representative who has indicated his/her understanding and acceptance.     Dental advisory given  Plan Discussed with: CRNA  Anesthesia Plan Comments:           Anesthesia Quick Evaluation

## 2024-06-20 DIAGNOSIS — I2510 Atherosclerotic heart disease of native coronary artery without angina pectoris: Secondary | ICD-10-CM | POA: Diagnosis not present

## 2024-06-20 DIAGNOSIS — I4819 Other persistent atrial fibrillation: Secondary | ICD-10-CM | POA: Diagnosis not present

## 2024-06-20 DIAGNOSIS — I10 Essential (primary) hypertension: Secondary | ICD-10-CM | POA: Diagnosis not present

## 2024-06-20 DIAGNOSIS — I4510 Unspecified right bundle-branch block: Secondary | ICD-10-CM | POA: Diagnosis not present

## 2024-06-20 LAB — BASIC METABOLIC PANEL WITH GFR
Anion gap: 5 (ref 5–15)
BUN: 12 mg/dL (ref 8–23)
CO2: 26 mmol/L (ref 22–32)
Calcium: 9 mg/dL (ref 8.9–10.3)
Chloride: 105 mmol/L (ref 98–111)
Creatinine, Ser: 0.94 mg/dL (ref 0.44–1.00)
GFR, Estimated: 59 mL/min — ABNORMAL LOW (ref >60)
Glucose, Bld: 107 mg/dL — ABNORMAL HIGH (ref 70–99)
Potassium: 4.1 mmol/L (ref 3.5–5.1)
Sodium: 136 mmol/L (ref 135–145)

## 2024-06-20 LAB — CBC
HCT: 35.1 % — ABNORMAL LOW (ref 36.0–46.0)
Hemoglobin: 11.7 g/dL — ABNORMAL LOW (ref 12.0–15.0)
MCH: 34.1 pg — ABNORMAL HIGH (ref 26.0–34.0)
MCHC: 33.3 g/dL (ref 30.0–36.0)
MCV: 102.3 fL — ABNORMAL HIGH (ref 80.0–100.0)
Platelets: 177 K/uL (ref 150–400)
RBC: 3.43 MIL/uL — ABNORMAL LOW (ref 3.87–5.11)
RDW: 13.2 % (ref 11.5–15.5)
WBC: 8.1 K/uL (ref 4.0–10.5)
nRBC: 0 % (ref 0.0–0.2)

## 2024-06-20 MED ORDER — HYDROCORTISONE 1 % EX CREA
1.0000 | TOPICAL_CREAM | Freq: Three times a day (TID) | CUTANEOUS | Status: DC | PRN
  Administered 2024-06-20: 1 via TOPICAL
  Filled 2024-06-20: qty 28

## 2024-06-20 MED FILL — Fentanyl Citrate Preservative Free (PF) Inj 100 MCG/2ML: INTRAMUSCULAR | Qty: 2 | Status: AC

## 2024-06-20 NOTE — Progress Notes (Signed)
 DISCHARGE NOTE HOME Sarah Ray to be discharged Home per MD order. Discussed prescriptions and follow up appointments with the patient. Prescriptions given to patient; medication list explained in detail. Patient verbalized understanding.  Skin clean, dry and intact without evidence of skin break down, no evidence of skin tears noted. IV catheter discontinued intact. Site without signs and symptoms of complications. Dressing and pressure applied. Pt denies pain at the site currently. No complaints noted.  Patient free of lines, drains, and wounds.   An After Visit Summary (AVS) was printed and given to the patient. Patient escorted via wheelchair, and discharged home via private auto.  Peyton SHAUNNA Pepper, RN

## 2024-06-20 NOTE — Care Management Obs Status (Signed)
 MEDICARE OBSERVATION STATUS NOTIFICATION   Patient Details  Name: Sarah Ray MRN: [id number] Date of Birth: 1941/09/05   Medicare Observation Status Notification Given:  Yes    Vonzell Arrie Sharps 06/20/2024, 9:07 AM

## 2024-06-20 NOTE — Discharge Summary (Addendum)
 ELECTROPHYSIOLOGY PROCEDURE DISCHARGE SUMMARY    Patient ID: Sarah Ray,  MRN: [id number], DOB/AGE: 05-04-1941 83 y.o.  Admit date: 06/19/2024 Discharge date: 06/20/2024  Primary Care Physician: Thurmond Cathlyn LABOR., MD  Primary Cardiologist: Dr. Delford Electrophysiologist: Dr. Inocencio  Primary Discharge Diagnosis:  persistent Atrial fibrillation      CHA2DS2Vasc is 5, on Eliquis   Secondary Discharge Diagnosis:  HTN RBBB CAD (remote PCI)  Procedures This Admission:  1.  Electrophysiology study and radiofrequency catheter ablation on 06/19/24 by Dr Inocencio   This study demonstrated  CONCLUSIONS: 1. Sinus rhythm upon presentation.   2. Successful electrical isolation and anatomical encircling of all four pulmonary veins with pulsed field ablation. 3. Posterior wall isolation using pulse field ablation 4. Cavotricuspid isthmus ablation 5. No early apparent complications..    Brief HPI: Sarah Ray is a 83 y.o. female with a history of persistent atrial fibrillation.  Risks, benefits, and alternatives to catheter ablation of atrial fibrillation were reviewed with the patient who wished to proceed.    Hospital Course:  The patient was admitted and underwent EPS/RFCA of atrial fibrillation with details as outlined above.  Planned initially for same day discharge though developed very transient speech difficulties.  Code stroke called, on their arrival, NIHSS completed with no focal deficits appreciated.  She was admitted for overnight observation.  She was monitored on telemetry overnight which demonstrated SR.  She has ambulated without difficulty and has not had recurrent neuro symptoms.  Groin sites are without complication on the day of discharge.  The patient feels well today, denies CP, SOB, site discomfort.  She was examined by Dr. Inocencio and considered to be stable for discharge.  Wound care and restrictions were reviewed with the patient.  The patient Sarah Ray be seen back by the EP  team in 4 weeks and 12 weeks for post ablation follow up.    Physical Exam: Vitals:   06/19/24 2005 06/19/24 2323 06/20/24 0350 06/20/24 0801  BP:  122/72 125/70 125/65  Pulse:  64 68 68  Resp: 20 16 18 20   Temp:  97.7 F (36.5 C) 97.6 F (36.4 C) 98.7 F (37.1 C)  TempSrc:  Oral Oral Oral  SpO2: 92% 94% 94% 92%  Weight:      Height:        GEN- The patient is well appearing, alert and oriented x 3 today.   HEENT: normocephalic, atraumatic; sclera clear, conjunctiva pink; hearing intact; oropharynx clear; neck supple  Lungs- CTA b/l, normal work of breathing.  No wheezes, rales, rhonchi Heart- RRR, no murmurs, rubs or gallops  GI- soft, non-tender, non-distended, bowel sounds present  Extremities- no clubbing, cyanosis, or edema; DP/PT/radial pulses 2+ bilaterally, b/l groins are without hematoma/bruit MS- no significant deformity or atrophy Skin- warm and dry, no rash or lesion Psych- euthymic mood, full affect Neuro- strength and sensation are intact   Labs:   Lab Results  Component Value Date   WBC 8.1 06/20/2024   HGB 11.7 (L) 06/20/2024   HCT 35.1 (L) 06/20/2024   MCV 102.3 (H) 06/20/2024   PLT 177 06/20/2024    Recent Labs  Lab 06/20/24 0348  NA 136  K 4.1  CL 105  CO2 26  BUN 12  CREATININE 0.94  CALCIUM  9.0  GLUCOSE 107*     Discharge Medications:  Allergies as of 06/20/2024       Reactions   Naprosyn [naproxen] Hives   Ticagrelor  Shortness Of Breath, Other (See  Comments)   Caused chest pains   Aspirin  Hives, Diarrhea, Swelling   Benadryl stops reaction   Atorvastatin  Other (See Comments)   Myalgias and leg heaviness   Bempedoic Acid     Burning sensation   Covid-19 (mrna) Vaccine Other (See Comments)   Developed A-Fib after injection   Gabapentin     dizziness   Milk-related Compounds Nausea And Vomiting   Praluent  [alirocumab ]    Flu like symptoms        Medication List     TAKE these medications    b complex vitamins  capsule Take 1 capsule by mouth daily.   CALCIUM -VITAMIN D PO Take 1 tablet by mouth 2 (two) times daily.   clopidogrel  75 MG tablet Commonly known as: PLAVIX  Take 1 tablet (75 mg total) by mouth at bedtime.   Eliquis  5 MG Tabs tablet Generic drug: apixaban  Take 1 tablet (5 mg total) by mouth 2 (two) times daily.   erythromycin  ophthalmic ointment Apply inch strip to right eye at night   estradiol  0.1 MG/GM vaginal cream Commonly known as: ESTRACE  PLACE 1 GRAM VAGINALLY 3 TIMES A WEEK   famotidine  20 MG tablet Commonly known as: PEPCID  Take 1 tablet (20 mg total) by mouth 2 (two) times daily. What changed: when to take this   GOLD BOND MEDICATED ANTI ITCH EX Apply 1 Application topically daily as needed (itching).   metoprolol  succinate 25 MG 24 hr tablet Commonly known as: TOPROL -XL Take 0.5 tablets (12.5 mg total) by mouth in the morning, at noon, and at bedtime. What changed: when to take this   metoprolol  tartrate 25 MG tablet Commonly known as: LOPRESSOR  Take 1 tablet (25 mg total) by mouth every 6 (six) hours as needed for afib hr over 100.   nitroGLYCERIN  0.4 MG SL tablet Commonly known as: NITROSTAT  Place 1 tablet (0.4 mg total) under the tongue every 5 (five) minutes as needed for chest pain.   pantoprazole  40 MG tablet Commonly known as: PROTONIX  TAKE 1 TABLET(40 MG) BY MOUTH DAILY   pantoprazole  40 MG tablet Commonly known as: PROTONIX  Take 1 tablet (40 mg total) by mouth daily.   rosuvastatin  5 MG tablet Commonly known as: CRESTOR  TAKE 1 TABLET BY MOUTH DAILY 5 DAYS A WEEK TO DAILY AS TOLERATED What changed:  how much to take how to take this when to take this additional instructions        Disposition: Home Discharge Instructions     Diet - low sodium heart healthy   Complete by: As directed    Increase activity slowly   Complete by: As directed         Duration of Discharge Encounter: 13 minutes, APP time.  Bonney Charlies Arthur, PA-C 06/20/2024 8:39 AM    Sarah Ray was seen by me today along with Charlies Arthur. I have personally performed an evaluation on this patient.  My findings are as follows: 83 y.o. female presented to the hospital yesterday for atrial fibrillation ablation.  Ablation was successful and she remains in sinus rhythm.  Yesterday post ablation, she developed word finding difficulties.  She was evaluated by rapid response and neurology who felt that she was not having any acute neurologic issues.  She feels well this morning.  She has no acute complaints..  Data: EKG(s) and pertinent labs, studies, etc were personally reviewed and interpreted by me:  EKG, telemetry Otherwise, I agree with data as outlined by the advanced practice provider.  Exam performed by  me: Gen: No acute distress Neck: No JVD Cardiac: No regular rhythm Lungs: Normal work of breathing Extremities: No edema  My Assessment and Plan: 1.  Persistent atrial fibrillation/flutter: Ablation yesterday.  Patient remains in sinus rhythm.  Had word-finding difficulties yesterday but this has improved.  Evaluation by neurology without acute neurologic issues.  Sarah Ray plan for discharge today with follow-up in clinic.  MD time at discharge: 20 minutes  Signed,  Therron Sells Gladis Norton, MD  06/20/2024 8:45 AM

## 2024-06-21 LAB — POCT ACTIVATED CLOTTING TIME: Activated Clotting Time: 464 s

## 2024-06-22 ENCOUNTER — Telehealth: Payer: Self-pay | Admitting: Cardiovascular Disease

## 2024-06-22 ENCOUNTER — Other Ambulatory Visit (HOSPITAL_BASED_OUTPATIENT_CLINIC_OR_DEPARTMENT_OTHER): Payer: Self-pay

## 2024-06-22 MED ORDER — ROSUVASTATIN CALCIUM 5 MG PO TABS
5.0000 mg | ORAL_TABLET | Freq: Every day | ORAL | 3 refills | Status: AC
  Filled 2024-06-22: qty 90, 90d supply, fill #0
  Filled 2024-09-17: qty 90, 90d supply, fill #1

## 2024-06-22 NOTE — Telephone Encounter (Signed)
*  STAT* If patient is at the pharmacy, call can be transferred to refill team.   1. Which medications need to be refilled? (please list name of each medication and dose if known) rosuvastatin  (CRESTOR ) 5 MG tablet    2. Would you like to learn more about the convenience, safety, & potential cost savings by using the [facility]? No    3. Are you open to using the [facility] (Type [facility]. No    4. Which pharmacy/location (including street and city if local pharmacy) is medication to be sent to? [facility]     5. Do they need a 30 day or 90 day supply? 90 day   Pt states she has worked her way up to taking medication 7 days a week, as discussed with Dr. Delford. She is requesting a prescription that matches this dosage. Please advise.

## 2024-06-22 NOTE — Telephone Encounter (Signed)
 RX sent in

## 2024-06-25 ENCOUNTER — Telehealth: Payer: Self-pay | Admitting: Cardiology

## 2024-06-25 NOTE — Telephone Encounter (Signed)
 Patient c/o Palpitations:  STAT if patient reporting lightheadedness, shortness of breath, or chest pain  How long have you had palpitations/irregular HR/ Afib? Are you having the symptoms now?  States she feels like she is still in afib since ablation   Are you currently experiencing lightheadedness, SOB or CP? No   Do you have a history of afib (atrial fibrillation) or irregular heart rhythm? Yes   Have you checked your BP or HR? (document readings if available): 159/110 hr103   Are you experiencing any other symptoms? No

## 2024-06-25 NOTE — Telephone Encounter (Signed)
 Pt reporting afib since ablation last week.  It has been almost constant for the last couple days. We did discuss breakthrough afib post procedure. BP this morning, before medications:  159/110, HR 103      At 8:30 am, hour after morning medications:  145/96, HR 78 Can feel the afib, anytime I have afib much my arms tingle/prickly feeling. Denies CP, edema, wt gain.  Pt aware I am forwarding to afib clinic to see if they can work her in this week for further evaluation.  Advised that if she goes back into [facility] to call and cancel appt.   Patient does feel better about seeing someone and is agreeable to plan.

## 2024-06-25 NOTE — Telephone Encounter (Signed)
 Appt made.

## 2024-06-26 ENCOUNTER — Ambulatory Visit (HOSPITAL_COMMUNITY)
Admission: RE | Admit: 2024-06-26 | Discharge: 2024-06-26 | Disposition: A | Discharge status: Home / Self Care | Source: Ambulatory Visit | Attending: Physician Assistant | Admitting: Physician Assistant

## 2024-06-26 VITALS — BP 142/76 | HR 64 | Ht 61.5 in | Wt 135.0 lb

## 2024-06-26 DIAGNOSIS — I4819 Other persistent atrial fibrillation: Secondary | ICD-10-CM | POA: Diagnosis not present

## 2024-06-26 DIAGNOSIS — I4891 Unspecified atrial fibrillation: Secondary | ICD-10-CM | POA: Diagnosis not present

## 2024-06-26 DIAGNOSIS — D6869 Other thrombophilia: Secondary | ICD-10-CM

## 2024-06-26 NOTE — Progress Notes (Signed)
 Primary Care Physician: Thurmond Cathlyn LABOR., MD Primary Cardiologist: Dr Delford Primary Electrophysiologist: Dr Inocencio  Referring Physician: Dr Delford [name] is a 83 y.o. female with a history of CAD, HTN, HLD, and new onset paroxysmal atrial fibrillation who presents for follow up in the [facility].  The patient was initially diagnosed with atrial fibrillation on 12/27/19 in cardiac rehab. She was asymptomatic but noted that her heart rate was elevated. ECG showed afib with RVR. Her metoprolol  was increased and she was started on diltiazem . Patient has a CHADS2VASC score of 5. She denies snoring or alcohol use.   Patient started having symptoms of lightheadedness with an unusual feeling in her chest on 04/08/22. She noted her heart rate was elevated and called EMS which confirmed she was out of rhythm. She elected to not go to the ED and called [facility]. Her Eliquis  was resumed and her metoprolol  was increased which resolved her symptoms. She was seen by Dr Inocencio and was scheduled for ablation on 10/04/23 but she cancelled her procedure as her husband was ill in the hospital and passed away during that admission.   Patient returns for follow up for atrial fibrillation. She is s/p afib ablation 06/19/24. She did have some afib post ablation over the weekend but she is back in SR now and feeling well. She denies chest pain or groin issues. No bleeding issues on anticoagulation.   Today, she  denies symptoms of chest pain, shortness of breath, orthopnea, PND, lower extremity edema, dizziness, presyncope, syncope, snoring, daytime somnolence, bleeding, or neurologic sequela. The patient is tolerating medications without difficulties and is otherwise without complaint today.    Atrial Fibrillation Risk Factors:  she does not have symptoms or diagnosis of sleep apnea. she does not have a history of rheumatic fever. she does not have a history of alcohol use. The  patient does have a history of early familial atrial fibrillation or other arrhythmias. Brother has afib and PPM.   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    CAD (coronary artery disease) 03/18/2014   STEMI- stent to RCA and nonobstructive disease LAD and LCX   External hemorrhoids    GERD (gastroesophageal reflux disease)    LA Class Grade C Erosive esophagitis   Heart attack (HCC) 2015   Hiatal hernia    HTN (hypertension)    Hyperlipidemia    Internal hemorrhoids    Osteopenia 04/2018   T score -1.9 FRAX 21% / 5%   Peptic stricture of esophagus    Skin cancer 2014   forehead   ST elevation myocardial infarction (STEMI) involving right coronary artery in recovery phase (HCC) 03/18/2014    Current Outpatient Medications  Medication Sig Dispense Refill   apixaban  (ELIQUIS ) 5 MG TABS tablet Take 1 tablet (5 mg total) by mouth 2 (two) times daily. 180 tablet 1   b complex vitamins capsule Take 1 capsule by mouth daily. (Patient taking differently: Take 1 capsule by mouth as needed.)     CALCIUM -VITAMIN D PO Take 1 tablet by mouth 2 (two) times daily.      clopidogrel  (PLAVIX ) 75 MG tablet Take 1 tablet (75 mg total) by mouth at bedtime. 90 tablet 3   erythromycin  ophthalmic ointment Apply inch strip to right eye at night (Patient  taking differently: Place 1 Application into the right eye as needed.) 3.5 g 6   estradiol  (ESTRACE ) 0.1 MG/GM vaginal cream PLACE 1 GRAM VAGINALLY 3 TIMES A WEEK (Patient taking differently: Place 1 Applicatorful vaginally as needed.) 42.5 g 0   famotidine  (PEPCID ) 20 MG tablet Take 1 tablet (20 mg total) by mouth 2 (two) times daily. (Patient taking differently: Take 20 mg by mouth daily.) 180 tablet 3   metoprolol  succinate  (TOPROL -XL) 25 MG 24 hr tablet Take 0.5 tablets (12.5 mg total) by mouth in the morning, at noon, and at bedtime. 135 tablet 3   metoprolol  tartrate (LOPRESSOR ) 25 MG tablet Take 1 tablet (25 mg total) by mouth every 6 (six) hours as needed for afib hr over 100. 45 tablet 1   nitroGLYCERIN  (NITROSTAT ) 0.4 MG SL tablet Place 1 tablet (0.4 mg total) under the tongue every 5 (five) minutes as needed for chest pain. 25 tablet 5   pantoprazole  (PROTONIX ) 40 MG tablet TAKE 1 TABLET(40 MG) BY MOUTH DAILY 90 tablet 3   pantoprazole  (PROTONIX ) 40 MG tablet Take 1 tablet (40 mg total) by mouth daily. 90 tablet 3   Pramoxine-Menthol-Dimethicone (GOLD BOND MEDICATED ANTI ITCH EX) Apply 1 Application topically daily as needed (itching).     rosuvastatin  (CRESTOR ) 5 MG tablet Take 1 tablet (5 mg total) by mouth daily. 90 tablet 3   No current facility-administered medications for this encounter.    ROS- All systems are reviewed and negative except as per the HPI above.  Physical Exam: Vitals:   06/26/24 1051  BP: (!) 142/76  Pulse: 64  Weight: 61.2 kg  Height: 5' 1.5 (1.562 m)    GEN: Well nourished, well developed in no acute distress CARDIAC: Regular rate and rhythm with occasional ectopy, no murmurs, rubs, gallops RESPIRATORY:  Clear to auscultation without rales, wheezing or rhonchi  ABDOMEN: Soft, non-tender, non-distended EXTREMITIES:  No edema; No deformity    Wt Readings from Last 3 Encounters:  06/26/24 61.2 kg  06/19/24 60.3 kg  03/12/24 61.5 kg    EKG today demonstrates  SR, RBBB, PAC Vent. rate 64 BPM PR interval 172 ms QRS duration 128 ms QT/QTcB 430/443 ms   Echo 05/28/22 demonstrated   1. Left ventricular ejection fraction, by estimation, is 55 to 60%. The  left ventricle has normal function. The left ventricle has no regional  wall motion abnormalities. Left ventricular diastolic parameters were  normal.   2. Right ventricular systolic function is normal. The right  ventricular  size is normal. There is normal pulmonary artery systolic pressure.   3. Left atrial size was mildly dilated.   4. The mitral valve is normal in structure. Trivial mitral valve  regurgitation. No evidence of mitral stenosis.   5. The aortic valve is tricuspid. Aortic valve regurgitation is mild to  moderate. No aortic stenosis is present.   6. The inferior vena cava is normal in size with greater than 50%  respiratory variability, suggesting right atrial pressure of 3 mmHg.   Epic records are reviewed at length today   CHA2DS2-VASc Score = 5  The patient's score is based upon: CHF History: 0 HTN History: 1 Diabetes History: 0 Stroke History: 0 Vascular Disease History: 1 Age Score: 2 Gender Score: 1       ASSESSMENT AND PLAN: Persistent Atrial Fibrillation/atrial flutter (ICD10:  I48.19) The patient's CHA2DS2-VASc score is 5, indicating a 7.2% annual risk of stroke.   S/p afib and flutter ablation 06/19/24 Patient  back in SR. Reassured patient that it is not unusual to have breakthrough afib right after an ablation.  Continue Toprol  25 mg BID with Lopresor 25 mg PRN q 6 hours for heart racing.  Continue Eliquis  5 mg BID with no missed doses for 3 months post ablation.  [employer]ia mobile for home monitoring.   Secondary Hypercoagulable State (ICD10:  D68.69) The patient is at significant risk for stroke/thromboembolism based upon her CHA2DS2-VASc Score of 5.  Continue Apixaban  (Eliquis ). No bleeding issues.   CAD S/p MI 2015 with PCI to RCA No anginal symptoms Followed by Dr Delford  HTN Stable on current regimen   Follow up in the AF clinic as scheduled.    Daril Kicks PA-C Afib Clinic [facility] [address] [place], [place] [address] [phone] 06/26/2024 11:04 AM

## 2024-07-09 ENCOUNTER — Telehealth (HOSPITAL_COMMUNITY): Payer: Self-pay | Admitting: *Deleted

## 2024-07-09 MED ORDER — METOPROLOL SUCCINATE ER 25 MG PO TB24: 25.0000 mg | ORAL_TABLET | Freq: Every day | ORAL | Status: DC

## 2024-07-09 NOTE — Telephone Encounter (Signed)
 pt called today stating since her visit  on 10/7 she has noticed intermittent weakness with associated bradycardia with it - today she felt weak took her heart rate and it was 39. It is currently 24 but she is feeling better than earlier. no syncope just describes as weak legs on metoprolol  25mg  bid - has noted no afib since our visit. HRs usually are in the 60s.  Discussed with Daril Kicks PA will decrease metoprolol  to 25mg  once a day and call with response end of the week. Pt in agreement.

## 2024-07-10 ENCOUNTER — Other Ambulatory Visit (HOSPITAL_BASED_OUTPATIENT_CLINIC_OR_DEPARTMENT_OTHER): Payer: Self-pay

## 2024-07-13 NOTE — Telephone Encounter (Signed)
 Patient called with update  116/76 63 (1 hr after meds) 123/68 50 (later in afternoon) 160/87 59 (Evening last night) 142/79 58  (before meds) 125/75 62 (1 hr after meds)  Patient would like to try taking 1/2 tablet twice a day and see if this will give her more even coverage of her BP - she will call on Monday with response to slight adjustment.

## 2024-07-16 ENCOUNTER — Other Ambulatory Visit (HOSPITAL_BASED_OUTPATIENT_CLINIC_OR_DEPARTMENT_OTHER): Payer: Self-pay

## 2024-07-17 ENCOUNTER — Ambulatory Visit (HOSPITAL_COMMUNITY): Admitting: Physician Assistant

## 2024-07-17 NOTE — Telephone Encounter (Signed)
 Patient heart rates are mostly 58-62 on current dose of metoprolol  but BP has continue to trend up. BP at one point yesterday was 183/91 and she did not feel well yesterday. Per Daril Kicks PA will keep metoprolol  at current dose of 25mg  daily and pt instructed to follow up with PCP regarding BP management as pt states PCP is close to her home for quick follow up. Pt will call if issues arise and will contact PCP regarding BP.

## 2024-07-18 ENCOUNTER — Other Ambulatory Visit: Payer: Self-pay

## 2024-07-18 ENCOUNTER — Other Ambulatory Visit (HOSPITAL_BASED_OUTPATIENT_CLINIC_OR_DEPARTMENT_OTHER): Payer: Self-pay

## 2024-07-18 MED ORDER — LOSARTAN POTASSIUM 50 MG PO TABS
50.0000 mg | ORAL_TABLET | Freq: Every day | ORAL | 3 refills | Status: AC
  Filled 2024-07-18: qty 90, 90d supply, fill #0
  Filled 2024-10-09: qty 90, 90d supply, fill #1

## 2024-07-30 ENCOUNTER — Other Ambulatory Visit: Payer: Self-pay | Admitting: Cardiology

## 2024-07-30 ENCOUNTER — Other Ambulatory Visit (HOSPITAL_BASED_OUTPATIENT_CLINIC_OR_DEPARTMENT_OTHER): Payer: Self-pay

## 2024-07-30 MED ORDER — AMLODIPINE BESYLATE 5 MG PO TABS
5.0000 mg | ORAL_TABLET | Freq: Every day | ORAL | 3 refills | Status: AC
  Filled 2024-07-30: qty 90, 90d supply, fill #0
  Filled 2024-10-22: qty 90, 90d supply, fill #1

## 2024-07-31 ENCOUNTER — Other Ambulatory Visit (HOSPITAL_COMMUNITY): Payer: Self-pay | Admitting: *Deleted

## 2024-07-31 ENCOUNTER — Other Ambulatory Visit (HOSPITAL_BASED_OUTPATIENT_CLINIC_OR_DEPARTMENT_OTHER): Payer: Self-pay

## 2024-07-31 MED ORDER — METOPROLOL SUCCINATE ER 25 MG PO TB24
12.5000 mg | ORAL_TABLET | Freq: Two times a day (BID) | ORAL | 2 refills | Status: AC
  Filled 2024-07-31: qty 90, 90d supply, fill #0

## 2024-08-02 ENCOUNTER — Other Ambulatory Visit (HOSPITAL_BASED_OUTPATIENT_CLINIC_OR_DEPARTMENT_OTHER): Payer: Self-pay

## 2024-08-21 ENCOUNTER — Other Ambulatory Visit (HOSPITAL_BASED_OUTPATIENT_CLINIC_OR_DEPARTMENT_OTHER): Payer: Self-pay

## 2024-08-21 MED ORDER — FAMOTIDINE 20 MG PO TABS
20.0000 mg | ORAL_TABLET | Freq: Two times a day (BID) | ORAL | 3 refills | Status: AC
  Filled 2024-08-21: qty 180, 90d supply, fill #0

## 2024-08-22 ENCOUNTER — Other Ambulatory Visit (HOSPITAL_BASED_OUTPATIENT_CLINIC_OR_DEPARTMENT_OTHER): Payer: Self-pay

## 2024-08-22 MED ORDER — IPRATROPIUM BROMIDE 0.06 % NA SOLN
2.0000 | Freq: Every day | NASAL | 11 refills | Status: AC
  Filled 2024-08-22: qty 15, 40d supply, fill #0
  Filled 2024-09-25: qty 15, 40d supply, fill #1

## 2024-08-28 ENCOUNTER — Telehealth: Payer: Self-pay

## 2024-08-28 NOTE — Telephone Encounter (Signed)
 Left message for patient to return call to schedule 1 year follow up with Dr San or POD B apps.  Will continue efforts.

## 2024-08-28 NOTE — Telephone Encounter (Signed)
 Patient has been scheduled for 2/3. Please advise, thank you

## 2024-09-05 ENCOUNTER — Other Ambulatory Visit: Payer: Self-pay | Admitting: Cardiovascular Disease

## 2024-09-06 ENCOUNTER — Other Ambulatory Visit (HOSPITAL_BASED_OUTPATIENT_CLINIC_OR_DEPARTMENT_OTHER): Payer: Self-pay

## 2024-09-06 MED ORDER — CLOPIDOGREL BISULFATE 75 MG PO TABS
75.0000 mg | ORAL_TABLET | Freq: Every day | ORAL | 1 refills | Status: AC
  Filled 2024-09-06: qty 90, 90d supply, fill #0

## 2024-09-17 ENCOUNTER — Other Ambulatory Visit (HOSPITAL_BASED_OUTPATIENT_CLINIC_OR_DEPARTMENT_OTHER): Payer: Self-pay

## 2024-09-18 ENCOUNTER — Ambulatory Visit (HOSPITAL_COMMUNITY)
Admission: RE | Admit: 2024-09-18 | Discharge: 2024-09-18 | Disposition: A | Discharge status: Home / Self Care | Source: Ambulatory Visit | Attending: Physician Assistant | Admitting: Physician Assistant

## 2024-09-18 VITALS — BP 162/76 | HR 70 | Ht 61.5 in | Wt 138.8 lb

## 2024-09-18 DIAGNOSIS — D6869 Other thrombophilia: Secondary | ICD-10-CM

## 2024-09-18 DIAGNOSIS — I4891 Unspecified atrial fibrillation: Secondary | ICD-10-CM

## 2024-09-18 DIAGNOSIS — I4819 Other persistent atrial fibrillation: Secondary | ICD-10-CM

## 2024-09-18 NOTE — Progress Notes (Signed)
 "   Primary Care Physician: Thurmond Cathlyn LABOR., MD Primary Cardiologist: Dr Delford Primary Electrophysiologist: Dr Inocencio  Referring Physician: Dr Delford Jenkins JONELLE Sarah Ray is a 83 y.o. female with a history of CAD, HTN, HLD, and new onset paroxysmal atrial fibrillation who presents for follow up in the [facility].  The patient was initially diagnosed with atrial fibrillation on 12/27/19 in cardiac rehab. She was asymptomatic but noted that her heart rate was elevated. ECG showed afib with RVR. Her metoprolol  was increased and she was started on diltiazem . Patient has a CHADS2VASC score of 5. She denies snoring or alcohol use.   Patient started having symptoms of lightheadedness with an unusual feeling in her chest on 04/08/22. She noted her heart rate was elevated and called EMS which confirmed she was out of rhythm. She elected to not go to the ED and called [facility]. Her Eliquis  was resumed and her metoprolol  was increased which resolved her symptoms. She was seen by Dr Inocencio and was scheduled for ablation on 10/04/23 but she cancelled her procedure as her husband was ill in the hospital and passed away during that admission. She is s/p afib ablation 06/19/24.   Patient returns for follow up for atrial fibrillation. She remains in SR and feels well. She states this is the best I have felt since my heart attack. No bleeding issues on anticoagulation. She does have some mild non pitting edema around her ankles since starting amlodipine .   Today, she  denies symptoms of palpitations, chest pain, shortness of breath, orthopnea, PND, dizziness, presyncope, syncope, snoring, daytime somnolence, bleeding, or neurologic sequela. The patient is tolerating medications without difficulties and is otherwise without complaint today.    Atrial Fibrillation Risk Factors:  she does not have symptoms or diagnosis of sleep apnea. she does not have a history of rheumatic fever. she does  not have a history of alcohol use. The patient does have a history of early familial atrial fibrillation or other arrhythmias. Brother has afib and PPM.   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    CAD (coronary artery disease) 03/18/2014   STEMI- stent to RCA and nonobstructive disease LAD and LCX   External hemorrhoids    GERD (gastroesophageal reflux disease)    LA Class Grade C Erosive esophagitis   Heart attack (HCC) 2015   Hiatal hernia    HTN (hypertension)    Hyperlipidemia    Internal hemorrhoids    Osteopenia 04/2018   T score -1.9 FRAX 21% / 5%   Peptic stricture of esophagus    Skin cancer 2014   forehead   ST elevation myocardial infarction (STEMI) involving right coronary artery in recovery phase (HCC) 03/18/2014    Current Outpatient Medications  Medication Sig Dispense Refill   amLODipine  (NORVASC ) 5 MG tablet Take 1 tablet (5 mg total) by mouth daily. 90 tablet 3   apixaban  (ELIQUIS ) 5 MG TABS tablet Take 1 tablet (5 mg total) by mouth 2 (two) times daily. 180 tablet 1   clopidogrel  (PLAVIX ) 75 MG tablet Take 1 tablet (75 mg total) by mouth at bedtime. 90 tablet 1   erythromycin  ophthalmic ointment Apply inch strip to right eye at night (Patient taking differently: Place 1 Application into the right eye as needed.) 3.5 g 6  estradiol  (ESTRACE ) 0.1 MG/GM vaginal cream PLACE 1 GRAM VAGINALLY 3 TIMES A WEEK (Patient taking differently: Place vaginally as needed.) 42.5 g 0   famotidine  (PEPCID ) 20 MG tablet Take 1 tablet (20 mg total) by mouth 2 (two) times daily. 180 tablet 3   ipratropium (ATROVENT ) 0.06 % nasal spray Place 2 sprays into both nostrils daily. 15 mL 11   losartan  (COZAAR ) 50 MG tablet Take 1 tablet (50 mg total) by mouth  daily. 90 tablet 3   metoprolol  succinate (TOPROL -XL) 25 MG 24 hr tablet Take 0.5 tablets (12.5 mg total) by mouth 2 (two) times daily. 90 tablet 2   metoprolol  tartrate (LOPRESSOR ) 25 MG tablet Take 1 tablet (25 mg total) by mouth every 6 (six) hours as needed for afib hr over 100. 45 tablet 1   nitroGLYCERIN  (NITROSTAT ) 0.4 MG SL tablet Place 1 tablet (0.4 mg total) under the tongue every 5 (five) minutes as needed for chest pain. 25 tablet 5   pantoprazole  (PROTONIX ) 40 MG tablet TAKE 1 TABLET(40 MG) BY MOUTH DAILY 90 tablet 3   pantoprazole  (PROTONIX ) 40 MG tablet Take 1 tablet (40 mg total) by mouth daily. 90 tablet 3   Pramoxine-Menthol-Dimethicone (GOLD BOND MEDICATED ANTI ITCH EX) Apply 1 Application topically daily as needed (itching). (Patient taking differently: Apply 1 Application topically as needed (itching).)     rosuvastatin  (CRESTOR ) 5 MG tablet Take 1 tablet (5 mg total) by mouth daily. 90 tablet 3   b complex vitamins capsule Take 1 capsule by mouth daily. (Patient not taking: Reported on 09/18/2024)     CALCIUM -VITAMIN D PO Take 1 tablet by mouth 2 (two) times daily.  (Patient not taking: Reported on 09/18/2024)     No current facility-administered medications for this encounter.    ROS- All systems are reviewed and negative except as per the HPI above.  Physical Exam: Vitals:   09/18/24 1314  BP: (!) 162/76  Pulse: 70  Weight: 63 kg  Height: 5' 1.5 (1.562 m)    GEN: Well nourished, well developed in no acute distress CARDIAC: Regular rate and rhythm with occasional ectopy, no murmurs, rubs, gallops RESPIRATORY:  Clear to auscultation without rales, wheezing or rhonchi  ABDOMEN: Soft, non-tender, non-distended EXTREMITIES:  No edema; No deformity    Wt Readings from Last 3 Encounters:  09/18/24 63 kg  06/26/24 61.2 kg  06/19/24 60.3 kg    EKG Interpretation Date/Time:  Tuesday September 18 2024 13:24:50 EST Ventricular Rate:  70 PR Interval:  164 QRS  Duration:  124 QT Interval:  436 QTC Calculation: 470 R Axis:   179  Text Interpretation: Sinus rhythm with Premature atrial complexes Right bundle branch block Abnormal ECG When compared with ECG of 26-Jun-2024 10:59, No significant change was found Confirmed by Kashonda Sarkisyan ([id number]) on 09/18/2024 1:40:36 PM    Echo 05/28/22 demonstrated   1. Left ventricular ejection fraction, by estimation, is 55 to 60%. The  left ventricle has normal function. The left ventricle has no regional  wall motion abnormalities. Left ventricular diastolic parameters were  normal.   2. Right ventricular systolic function is normal. The right ventricular  size is normal. There is normal pulmonary artery systolic pressure.   3. Left atrial size was mildly dilated.   4. The mitral valve is normal in structure. Trivial mitral valve  regurgitation. No evidence of mitral stenosis.   5. The aortic valve is tricuspid. Aortic valve regurgitation is mild to  moderate. No aortic stenosis is  present.   6. The inferior vena cava is normal in size with greater than 50%  respiratory variability, suggesting right atrial pressure of 3 mmHg.   Epic records are reviewed at length today   CHA2DS2-VASc Score = 5  The patient's score is based upon: CHF History: 0 HTN History: 1 Diabetes History: 0 Stroke History: 0 Vascular Disease History: 1 Age Score: 2 Gender Score: 1       ASSESSMENT AND PLAN: Persistent Atrial Fibrillation/atrial flutter (ICD10:  I48.19) The patient's CHA2DS2-VASc score is 5, indicating a 7.2% annual risk of stroke.   S/p afib and flutter ablation 06/19/24 Patient appears to be maintaining SR Continue Toprol  25 mg BID with Lopressor  25 mg PRN q 6 hours for heart racing.  Continue Eliquis  5 mg BID Kardia mobile for home monitoring.   Secondary Hypercoagulable State (ICD10:  D68.69) The patient is at significant risk for stroke/thromboembolism based upon her CHA2DS2-VASc Score of 5.  Continue  Apixaban  (Eliquis ). No bleeding issues.   CAD S/p MI 2015 with PCI to RCA No anginal symptoms Followed by Dr Delford  HTN Elevated today. Home BP log reviewed in office today which shows good BP control. No changes today.    Follow up with Dr Inocencio in 6 months.    Daril Kicks PA-C [facility] [address] [place], [place] [address] [phone] 09/18/2024 2:04 PM "

## 2024-09-25 ENCOUNTER — Other Ambulatory Visit (HOSPITAL_BASED_OUTPATIENT_CLINIC_OR_DEPARTMENT_OTHER): Payer: Self-pay

## 2024-09-27 ENCOUNTER — Other Ambulatory Visit (HOSPITAL_BASED_OUTPATIENT_CLINIC_OR_DEPARTMENT_OTHER): Payer: Self-pay

## 2024-10-08 ENCOUNTER — Other Ambulatory Visit: Payer: Self-pay | Admitting: Cardiology

## 2024-10-08 ENCOUNTER — Other Ambulatory Visit (HOSPITAL_BASED_OUTPATIENT_CLINIC_OR_DEPARTMENT_OTHER): Payer: Self-pay

## 2024-10-08 DIAGNOSIS — I480 Paroxysmal atrial fibrillation: Secondary | ICD-10-CM

## 2024-10-08 MED ORDER — APIXABAN 5 MG PO TABS
5.0000 mg | ORAL_TABLET | Freq: Two times a day (BID) | ORAL | 1 refills | Status: AC
  Filled 2024-10-08: qty 180, 90d supply, fill #0

## 2024-10-08 NOTE — Telephone Encounter (Signed)
 Prescription refill request for Eliquis  received. Indication: a fib Last office visit: 09/18/24 Scr: 0.94 epic 06/20/24 Age: 84 Weight: 61kg

## 2024-10-09 ENCOUNTER — Other Ambulatory Visit (HOSPITAL_BASED_OUTPATIENT_CLINIC_OR_DEPARTMENT_OTHER): Payer: Self-pay

## 2024-10-12 ENCOUNTER — Other Ambulatory Visit (HOSPITAL_BASED_OUTPATIENT_CLINIC_OR_DEPARTMENT_OTHER): Payer: Self-pay

## 2024-10-15 ENCOUNTER — Other Ambulatory Visit (HOSPITAL_BASED_OUTPATIENT_CLINIC_OR_DEPARTMENT_OTHER): Payer: Self-pay

## 2024-10-19 ENCOUNTER — Other Ambulatory Visit (HOSPITAL_BASED_OUTPATIENT_CLINIC_OR_DEPARTMENT_OTHER): Payer: Self-pay

## 2024-10-19 MED ORDER — CIPROFLOXACIN HCL 500 MG PO TABS
500.0000 mg | ORAL_TABLET | Freq: Two times a day (BID) | ORAL | 0 refills | Status: AC
  Filled 2024-10-19: qty 14, 7d supply, fill #0

## 2024-10-21 ENCOUNTER — Emergency Department (HOSPITAL_COMMUNITY)
Admission: EM | Admit: 2024-10-21 | Discharge: 2024-10-21 | Disposition: A | Discharge status: Home / Self Care | Attending: Emergency Medicine | Admitting: Emergency Medicine

## 2024-10-21 ENCOUNTER — Encounter (HOSPITAL_COMMUNITY): Payer: Self-pay | Admitting: *Deleted

## 2024-10-21 ENCOUNTER — Emergency Department (HOSPITAL_COMMUNITY)

## 2024-10-21 ENCOUNTER — Telehealth: Admitting: Family Medicine

## 2024-10-21 ENCOUNTER — Other Ambulatory Visit: Payer: Self-pay

## 2024-10-21 DIAGNOSIS — R1024 Suprapubic pain: Secondary | ICD-10-CM | POA: Diagnosis not present

## 2024-10-21 DIAGNOSIS — R300 Dysuria: Secondary | ICD-10-CM | POA: Diagnosis present

## 2024-10-21 DIAGNOSIS — D72829 Elevated white blood cell count, unspecified: Secondary | ICD-10-CM | POA: Diagnosis not present

## 2024-10-21 DIAGNOSIS — Z79899 Other long term (current) drug therapy: Secondary | ICD-10-CM | POA: Diagnosis not present

## 2024-10-21 DIAGNOSIS — Z7902 Long term (current) use of antithrombotics/antiplatelets: Secondary | ICD-10-CM | POA: Insufficient documentation

## 2024-10-21 DIAGNOSIS — Z7901 Long term (current) use of anticoagulants: Secondary | ICD-10-CM | POA: Insufficient documentation

## 2024-10-21 DIAGNOSIS — R109 Unspecified abdominal pain: Secondary | ICD-10-CM | POA: Insufficient documentation

## 2024-10-21 LAB — URINALYSIS, W/ REFLEX TO CULTURE (INFECTION SUSPECTED)
Bacteria, UA: NONE SEEN
Bilirubin Urine: NEGATIVE
Glucose, UA: NEGATIVE mg/dL
Hgb urine dipstick: NEGATIVE
Ketones, ur: NEGATIVE mg/dL
Leukocytes,Ua: NEGATIVE
Nitrite: NEGATIVE
Protein, ur: NEGATIVE mg/dL
Specific Gravity, Urine: 1.003 — ABNORMAL LOW (ref 1.005–1.030)
pH: 7 (ref 5.0–8.0)

## 2024-10-21 LAB — COMPREHENSIVE METABOLIC PANEL WITH GFR
ALT: 13 U/L (ref 0–44)
AST: 21 U/L (ref 15–41)
Albumin: 4.1 g/dL (ref 3.5–5.0)
Alkaline Phosphatase: 103 U/L (ref 38–126)
Anion gap: 9 (ref 5–15)
BUN: 11 mg/dL (ref 8–23)
CO2: 26 mmol/L (ref 22–32)
Calcium: 10 mg/dL (ref 8.9–10.3)
Chloride: 106 mmol/L (ref 98–111)
Creatinine, Ser: 0.89 mg/dL (ref 0.44–1.00)
GFR, Estimated: >60 mL/min
Glucose, Bld: 90 mg/dL (ref 70–99)
Potassium: 3.9 mmol/L (ref 3.5–5.1)
Sodium: 141 mmol/L (ref 135–145)
Total Bilirubin: 0.6 mg/dL (ref 0.0–1.2)
Total Protein: 6.8 g/dL (ref 6.5–8.1)

## 2024-10-21 LAB — WET PREP, GENITAL
Clue Cells Wet Prep HPF POC: NONE SEEN
Sperm: NONE SEEN
Trich, Wet Prep: NONE SEEN
WBC, Wet Prep HPF POC: <10
Yeast Wet Prep HPF POC: NONE SEEN

## 2024-10-21 LAB — CBC WITH DIFFERENTIAL/PLATELET
Abs Immature Granulocytes: 0.01 10*3/uL (ref 0.00–0.07)
Basophils Absolute: 0 10*3/uL (ref 0.0–0.1)
Basophils Relative: 1 %
Eosinophils Absolute: 0.1 10*3/uL (ref 0.0–0.5)
Eosinophils Relative: 2 %
HCT: 38.2 % (ref 36.0–46.0)
Hemoglobin: 12.5 g/dL (ref 12.0–15.0)
Immature Granulocytes: 0 %
Lymphocytes Relative: 21 %
Lymphs Abs: 1.3 10*3/uL (ref 0.7–4.0)
MCH: 33.6 pg (ref 26.0–34.0)
MCHC: 32.7 g/dL (ref 30.0–36.0)
MCV: 102.7 fL — ABNORMAL HIGH (ref 80.0–100.0)
Monocytes Absolute: 0.6 10*3/uL (ref 0.1–1.0)
Monocytes Relative: 11 %
Neutro Abs: 3.9 10*3/uL (ref 1.7–7.7)
Neutrophils Relative %: 65 %
Platelets: 202 10*3/uL (ref 150–400)
RBC: 3.72 MIL/uL — ABNORMAL LOW (ref 3.87–5.11)
RDW: 12.6 % (ref 11.5–15.5)
WBC: 5.9 10*3/uL (ref 4.0–10.5)
nRBC: 0 % (ref 0.0–0.2)

## 2024-10-21 NOTE — ED Provider Triage Note (Signed)
 Emergency Medicine Provider Triage Evaluation Note  BABETTE STUM , a 84 y.o. female  was evaluated in triage.  Pt complains of abdominal pain, dysuria. Reports she has been dysuria for some time, however has a vaginal hernia and that it could be related to this.  She saw her primary on Friday who diagnosed her with a UTI and started her on ciprofloxacin .  She has taken 4 doses of this but continues to have severe suprapubic abdominal pain and significant burning with urination.  Denies fevers, no flank pain.  Review of Systems  Positive:  Negative:   Physical Exam  BP (!) 144/72   Pulse 65   Temp 98.3 F (36.8 C)   Resp 17   Ht 5' 1 (1.549 m)   Wt 63 kg   SpO2 96%   BMI 26.24 kg/m  Gen:   Awake, no distress   Resp:  Normal effort  MSK:   Moves extremities without difficulty  Other:    Medical Decision Making  Medically screening exam initiated at 3:55 PM.  Appropriate orders placed.  Jenkins JONELLE Boers was informed that the remainder of the evaluation will be completed by another provider, this initial triage assessment does not replace that evaluation, and the importance of remaining in the ED until their evaluation is complete.     Nora Lauraine LABOR, PA-C 10/21/24 1556

## 2024-10-21 NOTE — ED Triage Notes (Signed)
 The pts pain has become worse she was given an rx yesterday

## 2024-10-21 NOTE — Discharge Instructions (Addendum)
 If your symptoms are not improving when you finish your antibiotics that your doctor prescribed, I do recommend that you call and follow-up with your gynecologist.

## 2024-10-21 NOTE — ED Triage Notes (Signed)
 Pt here with reports of having a bladder infection. Seen at PCP Friday but reports worsening symptoms.

## 2024-10-21 NOTE — ED Notes (Signed)
 Patient transported to CT

## 2024-10-21 NOTE — ED Provider Notes (Signed)
 " [facility] Provider Note   CSN: [id number] Arrival date & time: 10/21/24  1531     Patient presents with: uti   Sarah Ray is a 84 y.o. female presented to ED with complaint of dysuria.  Patient reports that she has a history of recurring UTIs.  She has been having burning discomfort and pressure in her vagina, worse with urination.  She saw her PCP 2 days ago diagnosed her with a UTI and put her on ciprofloxacin .  She has taken ciprofloxacin  now for 4 total doses.  No relief of her symptoms.  She went to go to urgent care today but they were all closed in [place] weather.  She is here in the ED instead.  She does report that she has seen gynecology in the past and she has vaginal prolapse, had a pessary that was removed   HPI     Prior to Admission medications  Medication Sig Start Date End Date Taking? Authorizing Provider  amLODipine  (NORVASC ) 5 MG tablet Take 1 tablet (5 mg total) by mouth daily. 07/30/24     apixaban  (ELIQUIS ) 5 MG TABS tablet Take 1 tablet (5 mg total) by mouth 2 (two) times daily. 10/08/24   Camnitz, Soyla Lunger, MD  b complex vitamins capsule Take 1 capsule by mouth daily. Patient not taking: Reported on 09/18/2024    [provider]  CALCIUM -VITAMIN D PO Take 1 tablet by mouth 2 (two) times daily.  Patient not taking: Reported on 09/18/2024    [provider]  ciprofloxacin  (CIPRO ) 500 MG tablet Take 1 tablet (500 mg total) by mouth 2 (two) times a day for 7 days. 10/19/24     clopidogrel  (PLAVIX ) 75 MG tablet Take 1 tablet (75 mg total) by mouth at bedtime. 09/06/24   Nishan, Peter C, MD  erythromycin  ophthalmic ointment Apply inch strip to right eye at night Patient taking differently: Place 1 Application into the right eye as needed. 03/21/24     estradiol  (ESTRACE ) 0.1 MG/GM vaginal cream PLACE 1 GRAM VAGINALLY 3 TIMES A WEEK Patient taking differently: Place vaginally as needed. 08/16/23    Chrzanowski, Jami B, NP  famotidine  (PEPCID ) 20 MG tablet Take 1 tablet (20 mg total) by mouth 2 (two) times daily. 08/21/24     ipratropium (ATROVENT ) 0.06 % nasal spray Place 2 sprays into both nostrils daily. 08/22/24     losartan  (COZAAR ) 50 MG tablet Take 1 tablet (50 mg total) by mouth daily. 07/18/24     metoprolol  succinate (TOPROL -XL) 25 MG 24 hr tablet Take 0.5 tablets (12.5 mg total) by mouth 2 (two) times daily. 07/31/24   Fenton, Clint R, PA  metoprolol  tartrate (LOPRESSOR ) 25 MG tablet Take 1 tablet (25 mg total) by mouth every 6 (six) hours as needed for afib hr over 100. 05/10/24   Fenton, Clint R, PA  nitroGLYCERIN  (NITROSTAT ) 0.4 MG SL tablet Place 1 tablet (0.4 mg total) under the tongue every 5 (five) minutes as needed for chest pain. 07/19/18   Delford Maude BROCKS, MD  pantoprazole  (PROTONIX ) 40 MG tablet TAKE 1 TABLET(40 MG) BY MOUTH DAILY 01/23/24   Cirigliano, Vito V, DO  pantoprazole  (PROTONIX ) 40 MG tablet Take 1 tablet (40 mg total) by mouth daily. 05/09/24   Cirigliano, Vito V, DO  Pramoxine-Menthol-Dimethicone (GOLD BOND MEDICATED ANTI ITCH EX) Apply 1 Application topically daily as needed (itching). Patient taking differently: Apply 1 Application topically as needed (itching).  [provider]  rosuvastatin  (CRESTOR ) 5 MG tablet Take 1 tablet (5 mg total) by mouth daily. 06/22/24   Nishan, Peter C, MD    Allergies: Naprosyn [naproxen], Ticagrelor , Aspirin , Atorvastatin , Bempedoic acid , Covid-19 (mrna) vaccine, Gabapentin , Milk-related compounds, and Praluent  [alirocumab ]    Review of Systems  Updated Vital Signs BP 119/68 (BP Location: Right Arm)   Pulse 69   Temp 98.3 F (36.8 C)   Resp 16   Ht 5' 1 (1.549 m)   Wt 63 kg   SpO2 99%   BMI 26.24 kg/m   Physical Exam Constitutional:      General: She is not in acute distress. HENT:     Head: Normocephalic and atraumatic.  Eyes:     Conjunctiva/sclera: Conjunctivae normal.     Pupils: Pupils are equal,  round, and reactive to light.  Cardiovascular:     Rate and Rhythm: Normal rate and regular rhythm.  Pulmonary:     Effort: Pulmonary effort is normal. No respiratory distress.  Abdominal:     General: There is no distension.     Tenderness: There is no abdominal tenderness.  Genitourinary:    Comments: GU exam was performed with persons nurse present as chaperone.  Normal external vaginal exam.  Patient does have lotion applied within the vaginal vault that she uses from over-the-counter.  No significant discharge noted within the vagina.  There is a small amount of the posterior prolapse, the urethral meatus is visible without obstruction Skin:    General: Skin is warm and dry.  Neurological:     General: No focal deficit present.     Mental Status: She is alert. Mental status is at baseline.  Psychiatric:        Mood and Affect: Mood normal.        Behavior: Behavior normal.     (all labs ordered are listed, but only abnormal results are displayed) Labs Reviewed  URINALYSIS, W/ REFLEX TO CULTURE (INFECTION SUSPECTED) - Abnormal; Notable for the following components:      Result Value   Specific Gravity, Urine 1.003 (*)    All other components within normal limits  CBC WITH DIFFERENTIAL/PLATELET - Abnormal; Notable for the following components:   RBC 3.72 (*)    MCV 102.7 (*)    All other components within normal limits  WET PREP, GENITAL  COMPREHENSIVE METABOLIC PANEL WITH GFR    EKG: None  Radiology: CT Renal Stone Study Result Date: 10/21/2024 CLINICAL DATA:  Abdominal and flank pain EXAM: CT ABDOMEN AND PELVIS WITHOUT CONTRAST TECHNIQUE: Multidetector CT imaging of the abdomen and pelvis was performed following the standard protocol without IV contrast. RADIATION DOSE REDUCTION: This exam was performed according to the departmental dose-optimization program which includes automated exposure control, adjustment of the mA and/or kV according to patient size and/or use of  iterative reconstruction technique. COMPARISON:  05/19/2015 FINDINGS: Lower chest: No acute pleural or parenchymal lung disease. Small hiatal hernia. Hepatobiliary: Unenhanced imaging of the liver demonstrates numerous hepatic cysts similar to prior study. No evidence of cholelithiasis or cholecystitis. Pancreas: Unremarkable unenhanced appearance. Spleen: Unremarkable unenhanced appearance. Adrenals/Urinary Tract: Multiple left renal peripelvic cysts are again noted, no specific imaging follow-up is recommended. No urinary tract calculi or obstructive uropathy within either kidney. The bladder is decompressed, which limits its evaluation. The adrenals appear stable. Stomach/Bowel: No bowel obstruction or ileus. The appendix, if still present, is not well visualized. Scattered sigmoid diverticulosis without evidence of acute diverticulitis. No bowel wall  thickening or inflammatory change. Small hiatal hernia. Vascular/Lymphatic: Aortic atherosclerosis. No enlarged abdominal or pelvic lymph nodes. Reproductive: Status post hysterectomy. No adnexal masses. Other: No free fluid or free intraperitoneal gas. There is a stable small fat containing indirect right inguinal hernia, abdominal wall defect measuring 1.4 cm. Musculoskeletal: No acute or destructive bony abnormalities. Reconstructed images demonstrate no additional findings. IMPRESSION: 1. No evidence of urinary tract calculi or obstructive uropathy within either kidney. 2. Sigmoid diverticulosis without diverticulitis. 3. Small hiatal hernia. 4. Stable fat containing right lower quadrant indirect inguinal hernia. No evidence of incarceration or bowel herniation. 5.  Aortic Atherosclerosis (ICD10-I70.0). Electronically Signed   By: Ozell Daring M.D.   On: 10/21/2024 16:28     Procedures   Medications Ordered in the ED - No data to display                                  Medical Decision Making Amount and/or Complexity of Data Reviewed Labs:  ordered.   Patient is here with dysuria and discomfort supra pubic.  Differential includes cystitis versus hernia versus colitis versus vaginitis vs other  CT imaging renal study ordered from triage no evidence of acute kidney stone, there is incidental diverticulosis, no clear evidence of inflammation  Labs reviewed, notable for no emergent findings.  Wet prep is negative.  Doubt PID, chlamydia, gonorrhea.  Her UA here does not suggest ongoing infection  I did review external records.  She has grown pansensitive E. coli in the past.  I also reviewed the UA from her PCPs office 3 days ago which was positive for leukocytes and nitrites.  I recommended she continue and complete her course of ciprofloxacin .  If her dysuria pain is more persistent, she will need to reach out to the OB that she already sees to arrange for a follow-up appointment.  She verbalized understanding will be discharged     Final diagnoses:  Dysuria    ED Discharge Orders     None          Jakyle Petrucelli, Donnice PARAS, MD 10/21/24 [phone]  "

## 2024-10-22 ENCOUNTER — Other Ambulatory Visit (HOSPITAL_BASED_OUTPATIENT_CLINIC_OR_DEPARTMENT_OTHER): Payer: Self-pay

## 2024-10-23 ENCOUNTER — Ambulatory Visit: Admitting: Gastroenterology

## 2024-10-23 ENCOUNTER — Other Ambulatory Visit (HOSPITAL_BASED_OUTPATIENT_CLINIC_OR_DEPARTMENT_OTHER): Payer: Self-pay

## 2024-10-23 MED ORDER — NITROFURANTOIN MONOHYD MACRO 100 MG PO CAPS
100.0000 mg | ORAL_CAPSULE | Freq: Two times a day (BID) | ORAL | 0 refills | Status: AC
  Filled 2024-10-23: qty 20, 10d supply, fill #0

## 2024-10-25 ENCOUNTER — Other Ambulatory Visit (HOSPITAL_BASED_OUTPATIENT_CLINIC_OR_DEPARTMENT_OTHER): Payer: Self-pay

## 2024-10-25 MED ORDER — CEFDINIR 300 MG PO CAPS
300.0000 mg | ORAL_CAPSULE | Freq: Every day | ORAL | 0 refills | Status: AC
  Filled 2024-10-25: qty 10, 10d supply, fill #0

## 2024-12-10 ENCOUNTER — Ambulatory Visit: Admitting: Gastroenterology

## 2024-12-11 ENCOUNTER — Ambulatory Visit: Admitting: Cardiovascular Disease
